# Patient Record
Sex: Female | Born: 1940 | Race: White | Hispanic: No | Marital: Single | Smoking: Never smoker
Health system: Southern US, Community
[De-identification: ages and names within clinical notes are randomized; demographics above are authoritative.]

## PROBLEM LIST (undated history)

## (undated) DIAGNOSIS — K259 Gastric ulcer, unspecified as acute or chronic, without hemorrhage or perforation: Secondary | ICD-10-CM

## (undated) DIAGNOSIS — D649 Anemia, unspecified: Secondary | ICD-10-CM

## (undated) DIAGNOSIS — E785 Hyperlipidemia, unspecified: Secondary | ICD-10-CM

## (undated) DIAGNOSIS — I779 Disorder of arteries and arterioles, unspecified: Secondary | ICD-10-CM

## (undated) DIAGNOSIS — I050 Rheumatic mitral stenosis: Secondary | ICD-10-CM

## (undated) DIAGNOSIS — I350 Nonrheumatic aortic (valve) stenosis: Secondary | ICD-10-CM

## (undated) DIAGNOSIS — E049 Nontoxic goiter, unspecified: Secondary | ICD-10-CM

## (undated) DIAGNOSIS — I10 Essential (primary) hypertension: Secondary | ICD-10-CM

## (undated) DIAGNOSIS — I214 Non-ST elevation (NSTEMI) myocardial infarction: Secondary | ICD-10-CM

## (undated) HISTORY — DX: Disorder of arteries and arterioles, unspecified: I77.9

## (undated) HISTORY — PX: TONSILLECTOMY: SUR1361

## (undated) HISTORY — DX: Anemia, unspecified: D64.9

## (undated) HISTORY — DX: Nonrheumatic aortic (valve) stenosis: I35.0

## (undated) HISTORY — DX: Essential (primary) hypertension: I10

## (undated) HISTORY — DX: Nontoxic goiter, unspecified: E04.9

## (undated) HISTORY — DX: Non-ST elevation (NSTEMI) myocardial infarction: I21.4

## (undated) HISTORY — PX: CYSTECTOMY: SUR359

## (undated) HISTORY — PX: CHOLECYSTECTOMY: SHX55

## (undated) HISTORY — DX: Hyperlipidemia, unspecified: E78.5

---

## 2020-04-22 ENCOUNTER — Encounter: Payer: Self-pay | Admitting: *Deleted

## 2020-04-22 DIAGNOSIS — I272 Pulmonary hypertension, unspecified: Secondary | ICD-10-CM | POA: Insufficient documentation

## 2020-04-22 DIAGNOSIS — I350 Nonrheumatic aortic (valve) stenosis: Secondary | ICD-10-CM | POA: Insufficient documentation

## 2020-04-22 NOTE — Progress Notes (Signed)
Cardiology Office Note   Date:  04/23/2020   ID:  Denise Page, DOB Oct 16, 1940, MRN [id number]  PCP:  Rosalee Kaufman, PA-C  Cardiologist:   Minus Breeding, MD Referring:  Rosalee Kaufman, PA-C  No chief complaint on file.     History of Present Illness: Denise Page is a 79 y.o. female who is referred by Rosalee Kaufman, PA-C for evaluation of aortic stenosis.  She at [facility] recently.  She went there really with abdominal and back pain.  I did review these records for this visit.  She was found to be severely anemic.  I do not see the etiology of this is identified.  She has been treated with iron.  She did have guaiac negative stool.  A colonoscopy was suggested but I do not think follow-up work-up has been arranged yet.  In the hospital she was noted to have elevated at bedtime troponins that stayed relatively flat around 2-300.  This was felt to be demand ischemia. Echo demonstrated left atrial dilatation.  EF was 55%.  There was moderate to severe aortic stenosis with mean gradient of 27 mm Hg.  There was mild pulmonary HTN and mild TR. she had never been told about a heart murmur.  She does not recall this but there is some confusion.  She has never had any prior cardiac work-up or diagnosis.  She is very limited.  It turns out she has some vertebral fractures and might need some injections.  She is here in part for preop clearance.  Because of the back pain and significant fatigue she does not do very much except maybe walk around a little bit at a store and frequently.  She does not describe chest pressure, neck or arm discomfort.  She does not have palpitations, presyncope or syncope.  She has been getting short of breath with mild activity but she is not describing PND or orthopnea.  She said some chronic lower extremity swelling.  Past Medical History:  Diagnosis Date  . Anemia   . Aortic stenosis   . Bilateral carotid artery disease ([place])   .  Enlarged thyroid   . Hyperlipidemia   . Hypertension   . NSTEMI (non-ST elevated myocardial infarction) [facility])     Past Surgical History:  Procedure Laterality Date  . CHOLECYSTECTOMY    . CYSTECTOMY    . TONSILLECTOMY       Current Outpatient Medications  Medication Sig Dispense Refill  . aspirin EC 81 MG tablet Take 81 mg by mouth daily. Swallow whole. (Patient not taking: Reported on 04/23/2020)    . carvedilol (COREG) 6.25 MG tablet Take 1 tablet (6.25 mg total) by mouth 2 (two) times daily with a meal. 180 tablet 1  . ferrous sulfate 325 (65 FE) MG tablet Take 325 mg by mouth daily with breakfast. (Patient not taking: Reported on 04/23/2020)    . folic acid (FOLVITE) 1 MG tablet Take 1 mg by mouth daily. (Patient not taking: Reported on 04/23/2020)    . lisinopril (ZESTRIL) 5 MG tablet Take 1 tablet (5 mg total) by mouth daily. 90 tablet 1  . rosuvastatin (CRESTOR) 5 MG tablet Take 1 tablet (5 mg total) by mouth daily. 90 tablet 1   No current facility-administered medications for this visit.    Allergies:   Morphine and related, Sulfa antibiotics, and Codeine    Social History:  The patient  reports that she has never smoked. She has never used smokeless  tobacco.   Family History:  The patient's family history is not on file.    ROS:  Please see the history of present illness.   Otherwise, review of systems are positive for none.   All other systems are reviewed and negative.    PHYSICAL EXAM: VS:  BP 140/80   Pulse 74   Ht 5\' 8"  (1.727 m)   Wt 171 lb 12.8 oz (77.9 kg)   SpO2 97%   BMI 26.12 kg/m  , BMI Body mass index is 26.12 kg/m. GENERAL:  Well appearing HEENT:  Pupils equal round and reactive, fundi not visualized, oral mucosa unremarkable NECK:  No jugular venous distention, waveform within normal limits, carotid upstroke brisk and symmetric, no bruits, no thyromegaly LYMPHATICS:  No cervical, inguinal adenopathy LUNGS:  Clear to auscultation  bilaterally BACK:  No CVA tenderness CHEST:  Unremarkable HEART:  PMI not displaced or sustained,S1 and S2 within normal limits, no S3, no S4, no clicks, no rubs, 3 of 6 apical systolic murmur radiating at the aortic outflow tract, no diastolic murmurs ABD:  Flat, positive bowel sounds normal in frequency in pitch, no bruits, no rebound, no guarding, no midline pulsatile mass, no hepatomegaly, no splenomegaly EXT:  2 plus pulses throughout, no edema, no cyanosis no clubbing SKIN:  No rashes no nodules NEURO:  Cranial nerves II through XII grossly intact, motor grossly intact throughout PSYCH:  Cognitively intact, oriented to person place and time    EKG:  EKG is not ordered today. The ekg ordered March 19, 2020 demonstrates sinus rhythm, rate 83, axis within normal limits, intervals within normal limits, no acute ST-T wave changes.   Recent Labs: No results found for requested labs within last 8760 hours.    Lipid Panel No results found for: CHOL, TRIG, HDL, CHOLHDL, VLDL, LDLCALC, LDLDIRECT    Wt Readings from Last 3 Encounters:  04/23/20 171 lb 12.8 oz (77.9 kg)      Other studies Reviewed: Additional studies/ records that were reviewed today include: Hospital records from [facility] extensively reviewed and primary care office records. Review of the above records demonstrates:  Please see elsewhere in the note.     ASSESSMENT AND PLAN:  AORTIC STENOSIS: My plan is to follow her with another echocardiogram in 6 months.  At this point I do not think she is particularly symptomatic from this.  She certainly needs to have the anemia work-up first.  We had a long discussion about this anatomy.  PULMONARY HTN: I will follow this with the echo as above.  MR: This was reported as moderate by her transthoracic echo with left atrial dilatation.  This will be followed up in 6 months and she might end up needing a TEE as well.  ELEVATED TROPONIN: I suspect this was demand  ischemia.  However, she will need ischemia work-up.  She will not be able to walk on a treadmill so she will need a Lexiscan Myoview.    ANEMIA: I have given her strict instructions about making sure she has follow-up of this.  ENLARGED THYROID: I will defer to her primary provider.  She was apparently found to be mildly hypothyroid in the hospital but was not started on thyroid medication.  This has been deferred to her primary provider.  CAROTID STENOSIS: This was mentioned in her notes from the hospital and she will need to have follow-up in 1 year.This was found to be 50 to 69% right stenosis.  Current medicines are reviewed  at length with the patient today.  The patient does not have concerns regarding medicines.    The following changes have been made:  no change  Labs/ tests ordered today include:   Orders Placed This Encounter  Procedures  . NM Myocar Multi W/Spect W/Wall Motion / EF  . ECHOCARDIOGRAM COMPLETE     Disposition:   FU with after the echocardiogram in 6 months   Signed, Minus Breeding, MD  04/23/2020 5:37 PM    [place]

## 2020-04-23 ENCOUNTER — Encounter: Payer: Self-pay | Admitting: Cardiology

## 2020-04-23 ENCOUNTER — Ambulatory Visit (INDEPENDENT_AMBULATORY_CARE_PROVIDER_SITE_OTHER): Payer: Medicare Other | Admitting: Cardiology

## 2020-04-23 ENCOUNTER — Encounter: Payer: Self-pay | Admitting: *Deleted

## 2020-04-23 VITALS — BP 140/80 | HR 74 | Ht 68.0 in | Wt 171.8 lb

## 2020-04-23 DIAGNOSIS — R079 Chest pain, unspecified: Secondary | ICD-10-CM | POA: Diagnosis not present

## 2020-04-23 DIAGNOSIS — I272 Pulmonary hypertension, unspecified: Secondary | ICD-10-CM | POA: Diagnosis not present

## 2020-04-23 DIAGNOSIS — I350 Nonrheumatic aortic (valve) stenosis: Secondary | ICD-10-CM | POA: Diagnosis not present

## 2020-04-23 DIAGNOSIS — R778 Other specified abnormalities of plasma proteins: Secondary | ICD-10-CM

## 2020-04-23 MED ORDER — LISINOPRIL 5 MG PO TABS
5.0000 mg | ORAL_TABLET | Freq: Every day | ORAL | 1 refills | Status: DC
Start: 2020-04-23 — End: 2020-10-17

## 2020-04-23 MED ORDER — ROSUVASTATIN CALCIUM 5 MG PO TABS
5.0000 mg | ORAL_TABLET | Freq: Every day | ORAL | 1 refills | Status: DC
Start: 2020-04-23 — End: 2020-10-17

## 2020-04-23 MED ORDER — CARVEDILOL 6.25 MG PO TABS
6.2500 mg | ORAL_TABLET | Freq: Two times a day (BID) | ORAL | 1 refills | Status: DC
Start: 2020-04-23 — End: 2020-10-23

## 2020-04-23 NOTE — Patient Instructions (Addendum)
Medication Instructions:    Your physician recommends that you continue on your current medications as directed. Please refer to the Current Medication list given to you today.  Labwork:  None  Testing/Procedures: Your physician has requested that you have a lexiscan myoview. For further information please visit [email or web address]. Please follow instruction sheet, as given. Your physician has requested that you have an echocardiogram in 6 months. Echocardiography is a painless test that uses sound waves to create images of your heart. It provides your doctor with information about the size and shape of your heart and how well your heart's chambers and valves are working. This procedure takes approximately one hour. There are no restrictions for this procedure.  Follow-Up:  Your physician recommends that you schedule a follow-up appointment in: 6 months in [place].  Any Other Special Instructions Will Be Listed Below (If Applicable).  If you need a refill on your cardiac medications before your next appointment, please call your pharmacy.

## 2020-04-29 ENCOUNTER — Encounter (INDEPENDENT_AMBULATORY_CARE_PROVIDER_SITE_OTHER): Payer: Self-pay | Admitting: *Deleted

## 2020-05-07 ENCOUNTER — Encounter (HOSPITAL_BASED_OUTPATIENT_CLINIC_OR_DEPARTMENT_OTHER)
Admission: RE | Admit: 2020-05-07 | Discharge: 2020-05-07 | Disposition: A | Discharge status: Home / Self Care | Payer: Medicare Other | Source: Ambulatory Visit | Attending: Cardiology | Admitting: Cardiology

## 2020-05-07 ENCOUNTER — Encounter (HOSPITAL_COMMUNITY)
Admission: RE | Admit: 2020-05-07 | Discharge: 2020-05-07 | Disposition: A | Discharge status: Home / Self Care | Payer: Medicare Other | Source: Ambulatory Visit | Attending: Cardiology | Admitting: Cardiology

## 2020-05-07 ENCOUNTER — Other Ambulatory Visit: Payer: Self-pay

## 2020-05-07 DIAGNOSIS — R778 Other specified abnormalities of plasma proteins: Secondary | ICD-10-CM | POA: Insufficient documentation

## 2020-05-07 DIAGNOSIS — R079 Chest pain, unspecified: Secondary | ICD-10-CM | POA: Diagnosis present

## 2020-05-07 LAB — NM MYOCAR MULTI W/SPECT W/WALL MOTION / EF
LV dias vol: 120 mL (ref 46–106)
LV sys vol: 52 mL
Peak HR: 95 {beats}/min
RATE: 0.41
Rest HR: 72 {beats}/min
SDS: 6
SRS: 4
SSS: 10
TID: 1.24

## 2020-05-07 MED ORDER — SODIUM CHLORIDE FLUSH 0.9 % IV SOLN
INTRAVENOUS | Status: AC
  Administered 2020-05-07: 11:00:00 10 mL via INTRAVENOUS
  Filled 2020-05-07: qty 10

## 2020-05-07 MED ORDER — TECHNETIUM TC 99M TETROFOSMIN IV KIT
30.0000 | PACK | Freq: Once | INTRAVENOUS | Status: AC | PRN
  Administered 2020-05-07: 11:00:00 31 via INTRAVENOUS

## 2020-05-07 MED ORDER — TECHNETIUM TC 99M TETROFOSMIN IV KIT
10.0000 | PACK | Freq: Once | INTRAVENOUS | Status: AC | PRN
  Administered 2020-05-07: 10:00:00 9.7 via INTRAVENOUS

## 2020-05-07 MED ORDER — REGADENOSON 0.4 MG/5ML IV SOLN
INTRAVENOUS | Status: AC
  Administered 2020-05-07: 11:00:00 0.4 mg via INTRAVENOUS
  Filled 2020-05-07: qty 5

## 2020-05-09 ENCOUNTER — Telehealth: Payer: Self-pay | Admitting: Cardiology

## 2020-05-09 NOTE — Telephone Encounter (Signed)
Pt daughter [name] [facility]) aware - routed to Leonel Ramsay, MD  Nayali Talerico T, CMA Negative stress test . No further testing. Call Ms. Harlow Mares with the results and send results to Rosalee Kaufman, PA-C

## 2020-05-09 NOTE — Telephone Encounter (Signed)
Stress test done 12/15

## 2020-05-09 NOTE — Telephone Encounter (Signed)
Patient called requesting test results of recent stress .

## 2020-05-09 NOTE — Telephone Encounter (Signed)
Please see results note. Thanks.

## 2020-06-18 ENCOUNTER — Telehealth: Payer: Self-pay

## 2020-06-18 NOTE — Telephone Encounter (Signed)
    [facility] Pre-operative Risk Assessment    HEARTCARE STAFF: - Please ensure there is not already an duplicate clearance open for this procedure. - Under Visit Info/Reason for Call, type in Other and utilize the format Clearance MM/DD/YY or Clearance TBD. Do not use dashes or single digits. - If request is for dental extraction, please clarify the # of teeth to be extracted.  Request for surgical clearance:  1. What type of surgery is being performed? Colonoscopy/Endoscopy   2. When is this surgery scheduled? TBD   3. What type of clearance is required (medical clearance vs. Pharmacy clearance to hold med vs. Both)? medical  4. Are there any medications that need to be held prior to surgery and how long?None   5. Practice name and name of physician performing surgery? [facility] , Vicie Mutters PA   6. What is the office phone number? [phone]   7.   What is the office fax number? [phone]  8.   Anesthesia type (None, local, MAC, general) ? Propofol   Monia Pouch 06/18/2020, 5:36 PM  _________________________________________________________________   (provider comments below)

## 2020-06-19 NOTE — Telephone Encounter (Signed)
   Primary Cardiologist: Minus Breeding, MD  Chart reviewed as part of pre-operative protocol coverage. Given past medical history and time since last visit, based on ACC/AHA guidelines, Kanika Bungert would be at acceptable risk for the planned procedure without further cardiovascular testing.   I will route this recommendation to the requesting party via Epic fax function and remove from pre-op pool.  Please call with questions.  Jossie Ng. Copelan Maultsby NP-C    06/19/2020, 8:25 AM [address] Office [phone] Fax [phone]

## 2020-06-20 ENCOUNTER — Other Ambulatory Visit: Payer: Self-pay | Admitting: Gastroenterology

## 2020-07-29 ENCOUNTER — Other Ambulatory Visit (HOSPITAL_COMMUNITY)
Admission: RE | Admit: 2020-07-29 | Discharge: 2020-07-29 | Disposition: A | Discharge status: Home / Self Care | Payer: Medicare Other | Source: Ambulatory Visit | Attending: Gastroenterology | Admitting: Gastroenterology

## 2020-07-29 DIAGNOSIS — Z01812 Encounter for preprocedural laboratory examination: Secondary | ICD-10-CM | POA: Diagnosis present

## 2020-07-29 DIAGNOSIS — Z20822 Contact with and (suspected) exposure to covid-19: Secondary | ICD-10-CM | POA: Insufficient documentation

## 2020-07-29 LAB — SARS CORONAVIRUS 2 (TAT 6-24 HRS): SARS Coronavirus 2: NEGATIVE

## 2020-07-31 NOTE — H&P (Signed)
History of Present Illness  General:          80 year old female with history of bilateral carotid artery disease, hyperlipidemia, history of myocardial infarction, aortic stenosis, previous cholecystectomy presents with anemia, and positive blood in stool, follow up from 05/20/20 visit.         Patient was seen last visit for anemia with positive Hemoccult after a hospitalization at [facility] 03/26/2020. Patient was having severe pain radiated around with weakness, severely anemic, did have veterbal compression fractures.         After long discussion with the patient and her daughter they decided to do more conservative measures due to her Co morbidities and dementia. due to patient's epigastric pain, possible melena versus iron use, patient was treated for possible upper GI bleed.        she was started on Protonix twice daily, Carafate twice a day and here for close follow-up.        Last visit her white blood cell count was 2.6, hemoglobin 11.6 hematocrit 35.2, platelets 147. ( Labs 03/26/2020 White blood cell count 3.6, hemoglobin 9.2, hematocrit 31.6, MCV 77, platelets 127. )        She states the medications have helped considerably, she no longer has epigastric pain, her appetite is better, her weight is up, she is on calcitonin nasal spray for her vetebral fractures and doing well with it. She continues to have dark stools but she is on an iron supplement daily She continues to have some fatigue and dizziness but daughter states her weakness has improved.         Has never had colonoscopy. Has always had constipation, will have alternating diarrha/constipation.         No bright red blood in the stool, no rectal pain, no weight loss, no nocturnal symptoms.        Patient was recently at [facility] on 03/24/2020.        Labs from 03/26/20: White blood cell count 3.6, hemoglobin 9.2, hematocrit 31.6, MCV 77, platelets 127. TSH 7.5GFR is 55, protein 7.1, normal alkaline phosphatase,  normal AST and ALT.         03/24/2020 CT chest,abdomen and pelvis with contrast showsed cardiomegaly, aortic atherosclerosis large hiatal hernia, compression fractures, normal small bowel, colon. Splenomegaly.     Vital Signs  Wt 179.8, Wt change 8.4 lb, Ht 68, BMI 27.34, Temp 97.3, Pulse sitting 74, BP sitting 161/79.   Examination  Gastroenterology Exam:       GENERAL APPEARANCE: Well developed, well nourished, no active distress, pleasant.Marland Kitchen NECK Full ROM, trachea midline, no thyromegaly or masses. RESPIRATORY clear to auscultation bilaterally , no crackles or wheezes. CARDIOVASCULAR RRR 4/6 holosystolic murmur. ABDOMEN bowel sounds present, non distended, epigastric and RUQ tenderness, no guarding or rigidity, no masses felt. RECTAL: external hemorrhoid(s) , decreased rectal tone , brown stool , stool guaiac negative. SKIN Warm and dry, good turgor without rashes. PSYCHIATRIC Alert and oriented x3, mood and affect appear normal. Repeats questions. Slow to stand with some imbalance..          Assessments  1. Iron deficiency anemia due to chronic blood loss - D50.0 (Primary)  2. Epigastric pain - R10.13    Treatment         WBC 2.6 L 4.0-11.0 - K/ul        RBC 3.85 L 4.20-5.40 - M/uL        HGB 11.3 L 12.0-16.0 - g/dL  HCT 33.3 L 37.0-47.0 - %        MCV 86.5  81.0-99.0 - fL        MCH 29.3  27.0-33.0 - pg        MCHC 33.8  32.0-36.0 - g/dL        RDW 15.7 H 11.5-15.5 - %        PLT 165  150-400 - K/uL         GLUCOSE 103 H 70-99 - mg/dL        BUN 14  6-26 - mg/dL        CREATININE 1.05  0.60-1.30 - mg/dl        eGFR (NON-AFRICAN AMERICAN) 50 L >60 - calc        eGFR (AFRICAN AMERICAN) 61  >60 - calc        SODIUM 139  136-145 - mmol/L        POTASSIUM 5.0  3.5-5.5 - mmol/L        CHLORIDE 105  98-107 - mmol/L        C02 30  22-32 - mmol/L        ANION GAP 8.6  6.0-20.0 - mmol/L        CALCIUM 9.4  8.6-10.3 - mg/dL        CA-Alb corrected 9.20   8.60-10.30 - mg/dL        T PROTEIN 6.9  6.0-8.3 - g/dL        ALBUMIN 4.2  3.4-4.8 - g/dL        T.BILI 0.6  0.3-1.0 - mg/dL        ALP 69  38-126 - U/L        AST 14  0-39 - U/L        ALT 9  0-52 - U/L    Current Medications  Taking  .Calcitonin ([place]) 200 UNIT/ACT Solution 1 spray in 1 nostril, alternating nostrils daily Nasally Once a day    .Pantoprazole Sodium 40 MG Tablet Delayed Release 1 tablet Orally twice a day before food    .Sucralfate 1 GM Tablet 1 tablet on an empty stomach Orally Twice a day before food or drink    .Albuterol , Notes: $Remove'90mg'kjzYRuC$     .Xanax(ALPRAZolam) 0.5 MG Tablet 1 tablet Orally Twice a day    .Folic Acid 1 MG Tablet 1 tablet Orally Once a day    .Rosuvastatin Calcium 5 MG Tablet 1 tablet Orally Once a day    .Carvedilol 6.25 MG Tablet 1 tablet with food Orally Twice a day    .Lisinopril 5 MG Tablet 1 tablet Orally Once a day   Not-Taking  .Aspirin $RemoveB'81mg'vEQMToCn$  tablet 1 tab Oral     Medication List reviewed and reconciled with the patient         Past Medical History        2 stress frature in back.         Heart attack.         Hemmoglobin low.        Surgical History         gall bladder surgery        Family History   no hx of colon cancer, polyps or liver diease.       Social History  General:   Tobacco use  cigarettes: Never smoked, Tobacco history last updated 06/05/2020. no Alcohol. no Recreational drug use.      Allergies   Morphine: heart stopped -  Side Effects   Codeine: vomiting - Side Effects   Tramadol: vomiting - Side Effects   Sulfur: hives - Allergy       Hospitalization/Major Diagnostic Procedure   pain and weakness 03/2020   heart attack, stayed overnight 02/2020   not in the past year 05/2020       Review of Systems  GI PROCEDURE:         Pacemaker/ AICD no. Artificial heart valves no. MI/heart attack YES. Abnormal heart rhythm YES, leaky valve. Angina no. CVA no.  Hypertension YES. Hypotension no. Asthma, COPD no. Sleep apnea YES. Seizure disorders no. Artificial joints no. Severe DJD no. Diabetes no. Significant headaches no. Vertigo YES. Depression/anxiety no. Abnormal bleeding no. Kidney Disease no. Liver disease no. Chance of pregnancy no. Blood transfusion YES.   Plan: Diagnostic EGD and colonoscopy.              Visit Codes   99213 OV LEVEL 3.        Procedure Codes   [phone] BLOOD COLLECTION ROUTINE VENIPUNCTURE

## 2020-08-01 ENCOUNTER — Ambulatory Visit (HOSPITAL_COMMUNITY): Payer: Medicare Other | Admitting: Certified Registered Nurse Anesthetist

## 2020-08-01 ENCOUNTER — Encounter (HOSPITAL_COMMUNITY): Payer: Self-pay | Admitting: Gastroenterology

## 2020-08-01 ENCOUNTER — Encounter (HOSPITAL_COMMUNITY)
Admission: RE | Disposition: A | Discharge status: Home / Self Care | Payer: Self-pay | Source: Home / Self Care | Attending: Gastroenterology

## 2020-08-01 ENCOUNTER — Other Ambulatory Visit: Payer: Self-pay

## 2020-08-01 ENCOUNTER — Ambulatory Visit (HOSPITAL_COMMUNITY)
Admission: RE | Admit: 2020-08-01 | Discharge: 2020-08-01 | Disposition: A | Discharge status: Home / Self Care | Payer: Medicare Other | Attending: Gastroenterology | Admitting: Gastroenterology

## 2020-08-01 DIAGNOSIS — I252 Old myocardial infarction: Secondary | ICD-10-CM | POA: Diagnosis not present

## 2020-08-01 DIAGNOSIS — K621 Rectal polyp: Secondary | ICD-10-CM | POA: Diagnosis not present

## 2020-08-01 DIAGNOSIS — Z9049 Acquired absence of other specified parts of digestive tract: Secondary | ICD-10-CM | POA: Insufficient documentation

## 2020-08-01 DIAGNOSIS — Z79899 Other long term (current) drug therapy: Secondary | ICD-10-CM | POA: Diagnosis not present

## 2020-08-01 DIAGNOSIS — K648 Other hemorrhoids: Secondary | ICD-10-CM | POA: Insufficient documentation

## 2020-08-01 DIAGNOSIS — K921 Melena: Secondary | ICD-10-CM | POA: Diagnosis not present

## 2020-08-01 DIAGNOSIS — D175 Benign lipomatous neoplasm of intra-abdominal organs: Secondary | ICD-10-CM | POA: Diagnosis not present

## 2020-08-01 DIAGNOSIS — Z885 Allergy status to narcotic agent status: Secondary | ICD-10-CM | POA: Diagnosis not present

## 2020-08-01 DIAGNOSIS — Z7982 Long term (current) use of aspirin: Secondary | ICD-10-CM | POA: Insufficient documentation

## 2020-08-01 DIAGNOSIS — D120 Benign neoplasm of cecum: Secondary | ICD-10-CM | POA: Diagnosis not present

## 2020-08-01 DIAGNOSIS — E785 Hyperlipidemia, unspecified: Secondary | ICD-10-CM | POA: Diagnosis not present

## 2020-08-01 DIAGNOSIS — D125 Benign neoplasm of sigmoid colon: Secondary | ICD-10-CM | POA: Diagnosis not present

## 2020-08-01 DIAGNOSIS — Q399 Congenital malformation of esophagus, unspecified: Secondary | ICD-10-CM | POA: Insufficient documentation

## 2020-08-01 DIAGNOSIS — Z888 Allergy status to other drugs, medicaments and biological substances status: Secondary | ICD-10-CM | POA: Insufficient documentation

## 2020-08-01 DIAGNOSIS — I350 Nonrheumatic aortic (valve) stenosis: Secondary | ICD-10-CM | POA: Diagnosis not present

## 2020-08-01 DIAGNOSIS — D500 Iron deficiency anemia secondary to blood loss (chronic): Secondary | ICD-10-CM | POA: Insufficient documentation

## 2020-08-01 DIAGNOSIS — K5730 Diverticulosis of large intestine without perforation or abscess without bleeding: Secondary | ICD-10-CM | POA: Insufficient documentation

## 2020-08-01 DIAGNOSIS — K2950 Unspecified chronic gastritis without bleeding: Secondary | ICD-10-CM | POA: Insufficient documentation

## 2020-08-01 DIAGNOSIS — K449 Diaphragmatic hernia without obstruction or gangrene: Secondary | ICD-10-CM | POA: Diagnosis not present

## 2020-08-01 HISTORY — PX: COLONOSCOPY: SHX5424

## 2020-08-01 HISTORY — PX: POLYPECTOMY: SHX5525

## 2020-08-01 HISTORY — PX: BIOPSY: SHX5522

## 2020-08-01 HISTORY — PX: ESOPHAGOGASTRODUODENOSCOPY (EGD) WITH PROPOFOL: SHX5813

## 2020-08-01 SURGERY — ESOPHAGOGASTRODUODENOSCOPY (EGD) WITH PROPOFOL
Anesthesia: Monitor Anesthesia Care

## 2020-08-01 MED ORDER — PROPOFOL 500 MG/50ML IV EMUL
INTRAVENOUS | Status: AC
  Filled 2020-08-01: qty 50

## 2020-08-01 MED ORDER — PHENYLEPHRINE HCL-NACL 10-0.9 MG/250ML-% IV SOLN
INTRAVENOUS | Status: DC | PRN
  Administered 2020-08-01: 40 ug/min via INTRAVENOUS

## 2020-08-01 MED ORDER — SODIUM CHLORIDE 0.9 % IV SOLN: INTRAVENOUS | Status: DC

## 2020-08-01 MED ORDER — PROPOFOL 10 MG/ML IV BOLUS
INTRAVENOUS | Status: DC | PRN
  Administered 2020-08-01: 20 mg via INTRAVENOUS

## 2020-08-01 MED ORDER — PROPOFOL 500 MG/50ML IV EMUL
INTRAVENOUS | Status: DC | PRN
  Administered 2020-08-01: 125 ug/kg/min via INTRAVENOUS

## 2020-08-01 MED ORDER — LACTATED RINGERS IV SOLN
INTRAVENOUS | Status: DC
  Administered 2020-08-01: 1000 mL via INTRAVENOUS

## 2020-08-01 SURGICAL SUPPLY — 14 items

## 2020-08-01 NOTE — Op Note (Signed)
[facility] Patient Name: Denise Page Procedure Date: 08/01/2020 MRN: [id number] Attending MD: Ronnette Juniper , MD Date of Birth: 1941-02-18 CSN: [id number] Age: 80 Admit Type: Outpatient Procedure:                Colonoscopy Indications:              This is the patient's first colonoscopy,                            Unexplained iron deficiency anemia Providers:                Ronnette Juniper, MD, Particia Nearing, RN, Laverda Sorenson,                            Technician, Dellie Catholic Referring MD:             Alex Gardener Medicines:                Monitored Anesthesia Care Complications:            No immediate complications. Estimated blood loss:                            Minimal. Estimated Blood Loss:     Estimated blood loss was minimal. Procedure:                Pre-Anesthesia Assessment:                           - Prior to the procedure, a History and Physical                            was performed, and patient medications and                            allergies were reviewed. The patient's tolerance of                            previous anesthesia was also reviewed. The risks                            and benefits of the procedure and the sedation                            options and risks were discussed with the patient.                            All questions were answered, and informed consent                            was obtained. Prior Anticoagulants: The patient has                            taken no previous anticoagulant or antiplatelet                            agents. ASA Grade  Assessment: IV - A patient with                            severe systemic disease that is a constant threat                            to life. After reviewing the risks and benefits,                            the patient was deemed in satisfactory condition to                            undergo the procedure.                           After obtaining informed  consent, the colonoscope                            was passed under direct vision. Throughout the                            procedure, the patient's blood pressure, pulse, and                            oxygen saturations were monitored continuously. The                            PCF-H190DL ([id number]) Olympus pediatric colonscope                            was introduced through the anus and advanced to the                            the terminal ileum. The colonoscopy was performed                            without difficulty. The patient tolerated the                            procedure well. The quality of the bowel                            preparation was adequate to identify polyps 6 mm                            and larger in size. Scope In: 10:30:18 AM Scope Out: 10:54:48 AM Scope Withdrawal Time: 0 hours 13 minutes 3 seconds  Total Procedure Duration: 0 hours 24 minutes 30 seconds  Findings:      Skin tags were found on perianal exam.      A 9 mm polyp was found in the rectum. The polyp was sessile. The polyp       was removed with a hot snare. Resection and retrieval were complete.      A 4 mm polyp was found in  the cecum. The polyp was sessile. The polyp       was removed with a cold biopsy forceps. Resection and retrieval were       complete.      The terminal ileum appeared normal.      A few small and large-mouthed diverticula were found in the sigmoid       colon, descending colon and cecum.      Non-bleeding internal hemorrhoids were found during retroflexion.      There was a small lipoma, in the cecum. Impression:               - Perianal skin tags found on perianal exam.                           - One 9 mm polyp in the rectum, removed with a hot                            snare. Resected and retrieved.                           - One 4 mm polyp in the cecum, removed with a cold                            biopsy forceps. Resected and retrieved.                            - The examined portion of the ileum was normal.                           - Diverticulosis in the sigmoid colon, in the                            descending colon and in the cecum.                           - Non-bleeding internal hemorrhoids.                           - Small lipoma in the cecum. Moderate Sedation:      Patient did not receive moderate sedation for this procedure, but       instead received monitored anesthesia care. Recommendation:           - Patient has a contact number available for                            emergencies. The signs and symptoms of potential                            delayed complications were discussed with the                            patient. Return to normal activities tomorrow.                            Written discharge instructions were provided  to the                            patient.                           - High fiber diet.                           - Continue present medications.                           - Await pathology results.                           - Repeat colonoscopy for surveillance based on                            pathology results. Procedure Code(s):        --- Professional ---                           [phone], Colonoscopy, flexible; with removal of                            tumor(s), polyp(s), or other lesion(s) by snare                            technique                           45380, 31, Colonoscopy, flexible; with biopsy,                            single or multiple Diagnosis Code(s):        --- Professional ---                           K62.1, Rectal polyp                           K63.5, Polyp of colon                           K64.8, Other hemorrhoids                           D17.5, Benign lipomatous neoplasm of                            intra-abdominal organs                           K64.4, Residual hemorrhoidal skin tags                           D50.9, Iron deficiency anemia,  unspecified                           K57.30,  Diverticulosis of large intestine without                            perforation or abscess without bleeding CPT copyright 2019 American Medical Association. All rights reserved. The codes documented in this report are preliminary and upon coder review may  be revised to meet current compliance requirements. Ronnette Juniper, MD 08/01/2020 11:04:58 AM This report has been signed electronically. Number of Addenda: 0

## 2020-08-01 NOTE — Anesthesia Preprocedure Evaluation (Addendum)
Anesthesia Evaluation  Patient identified by MRN, date of birth, ID band Patient awake    Reviewed: Allergy & Precautions, NPO status , Patient's Chart, lab work & pertinent test results, reviewed documented beta blocker date and time   History of Anesthesia Complications Negative for: history of anesthetic complications  Airway Mallampati: II  TM Distance: >3 FB Neck ROM: Full    Dental  (+) Dental Advisory Given, Missing, Poor Dentition   Pulmonary neg pulmonary ROS,    Pulmonary exam normal        Cardiovascular hypertension, Pt. on home beta blockers and Pt. on medications + Past MI and + Peripheral Vascular Disease  + Valvular Problems/Murmurs AS and MR  Rhythm:Regular Rate:Normal + Systolic murmurs  '21 TTE (Care Everywhere) - mildly increased LVwall  thickness. EF 55%. Grade I diastolic dysfunction (impaired relaxation). The left atrium is severely dilated in size. Moderate anterior and centrally directed MR. There is moderate to severe AS, mild AI. 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, estimated pulmonary artery systolic pressure is 45 mmHg  '21 Carotid US - 50-69% right ICAS, < 50% left ICAS     Neuro/Psych negative neurological ROS  negative psych ROS   GI/Hepatic Neg liver ROS, GERD  Medicated and Controlled,  Endo/Other  negative endocrine ROS  Renal/GU negative Renal ROS     Musculoskeletal negative musculoskeletal ROS (+)   Abdominal   Peds  Hematology negative hematology ROS (+)   Anesthesia Other Findings Covid test negative   Reproductive/Obstetrics                            Anesthesia Physical Anesthesia Plan  ASA: IV  Anesthesia Plan: MAC   Post-op Pain Management:    Induction: Intravenous  PONV Risk Score and Plan: 2 and Propofol infusion and Treatment may vary due to age or medical condition  Airway Management Planned: Nasal Cannula and Natural  Airway  Additional Equipment:   Intra-op Plan:   Post-operative Plan:   Informed Consent: I have reviewed the patients History and Physical, chart, labs and discussed the procedure including the risks, benefits and alternatives for the proposed anesthesia with the patient or authorized representative who has indicated his/her understanding and acceptance.       Plan Discussed with: CRNA and Anesthesiologist  Anesthesia Plan Comments:        Anesthesia Quick Evaluation

## 2020-08-01 NOTE — Brief Op Note (Signed)
08/01/2020  11:05 AM  PATIENT:  Denise Page  80 y.o. 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  POST-OPERATIVE DIAGNOSIS:  EGD 10 cm hiatal hernia,gastritis COLON:Sigmoid and cecal  polyp removed  PROCEDURE:  Procedure(s): ESOPHAGOGASTRODUODENOSCOPY (EGD) WITH PROPOFOL (N/A) COLONOSCOPY (N/A) BIOPSY POLYPECTOMY  SURGEON:  Surgeon(s) and Role:    Ronnette Juniper, MD - Primary  PHYSICIAN ASSISTANT:   ASSISTANTS: Karie Mainland  ANESTHESIA:   MAC  EBL:  Minimal  BLOOD ADMINISTERED:none  DRAINS: none   LOCAL MEDICATIONS USED:  NONE  SPECIMEN: Biopsies  DISPOSITION OF SPECIMEN:  PATHOLOGY  COUNTS:  YES  TOURNIQUET:  * No tourniquets in log *  DICTATION: .Dragon Dictation  PLAN OF CARE: Discharge to home after PACU  PATIENT DISPOSITION:  PACU - hemodynamically stable.   Delay start of Pharmacological VTE agent (>24hrs) due to surgical blood loss or risk of bleeding: not applicable

## 2020-08-01 NOTE — Transfer of Care (Signed)
Immediate Anesthesia Transfer of Care Note  Patient: Denise Page  Procedure(s) Performed: ESOPHAGOGASTRODUODENOSCOPY (EGD) WITH PROPOFOL (N/A ) COLONOSCOPY (N/A ) BIOPSY POLYPECTOMY  Patient Location: Endoscopy Unit  Anesthesia Type:MAC  Level of Consciousness: awake and patient cooperative  Airway & Oxygen Therapy: Patient Spontanous Breathing and Patient connected to face mask  Post-op Assessment: Report given to RN and Post -op Vital signs reviewed and stable  Post vital signs: Reviewed and stable  Last Vitals:  Vitals Value Taken Time  BP 85/43 08/01/20 1104  Temp 36.4 C 08/01/20 1104  Pulse 73 08/01/20 1105  Resp 14 08/01/20 1105  SpO2 98 % 08/01/20 1105  Vitals shown include unvalidated device data.  Last Pain:  Vitals:   08/01/20 1104  TempSrc: Axillary  PainSc:          Complications: No complications documented.

## 2020-08-01 NOTE — Interval H&P Note (Signed)
History and Physical Interval Note: 79/female with iron deficiency anemia, occult blood in stool,epigastric pain, possible melena/dark stools for an EGD and colonoscopy.  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  The various methods of treatment have been discussed with the patient and family. After consideration of risks, benefits and other options for treatment, the patient has consented to  Procedure(s): ESOPHAGOGASTRODUODENOSCOPY (EGD) WITH PROPOFOL (N/A) COLONOSCOPY (N/A) as a surgical intervention.  The patient's history has been reviewed, patient examined, no change in status, stable for surgery.  I have reviewed the patient's chart and labs.  Questions were answered to the patient's satisfaction.     Ronnette Juniper

## 2020-08-01 NOTE — Op Note (Signed)
[facility] Patient Name: Denise Page Procedure Date: 08/01/2020 MRN: [id number] Attending MD: Ronnette Juniper , MD Date of Birth: 1940/11/29 CSN: [id number] Age: 80 Admit Type: Outpatient Procedure:                Upper GI endoscopy Indications:              Unexplained iron deficiency anemia Providers:                Ronnette Juniper, MD, Particia Nearing, RN, Laverda Sorenson,                            Technician, Dellie Catholic Referring MD:             Alex Gardener Medicines:                Monitored Anesthesia Care Complications:            No immediate complications. Estimated blood loss:                            Minimal. Estimated Blood Loss:     Estimated blood loss was minimal. Procedure:                Pre-Anesthesia Assessment:                           - Prior to the procedure, a History and Physical                            was performed, and patient medications and                            allergies were reviewed. The patient's tolerance of                            previous anesthesia was also reviewed. The risks                            and benefits of the procedure and the sedation                            options and risks were discussed with the patient.                            All questions were answered, and informed consent                            was obtained. Prior Anticoagulants: The patient has                            taken no previous anticoagulant or antiplatelet                            agents. ASA Grade Assessment: IV - A patient with  severe systemic disease that is a constant threat                            to life. After reviewing the risks and benefits,                            the patient was deemed in satisfactory condition to                            undergo the procedure.                           After obtaining informed consent, the endoscope was                            passed  under direct vision. Throughout the                            procedure, the patient's blood pressure, pulse, and                            oxygen saturations were monitored continuously. The                            GIF-H190 ([id number]) Olympus gastroscope was                            introduced through the mouth, and advanced to the                            second part of duodenum. The upper GI endoscopy was                            accomplished without difficulty. The patient                            tolerated the procedure well. Scope In: Scope Out: Findings:      The examined esophagus was normal.      The Z-line was regular and was found 30 cm from the incisors.      The lower third of the esophagus was moderately tortuous.      A 10 cm hiatal hernia was present.      The cardia and gastric fundus were normal on retroflexion.      Diffuse mildly erythematous mucosa without bleeding was found in the       gastric body and in the gastric antrum. Biopsies were taken with a cold       forceps for Helicobacter pylori testing.      The examined duodenum was normal. Biopsies for histology were taken with       a cold forceps for evaluation of celiac disease. Impression:               - Normal esophagus.                           -  Z-line regular, 30 cm from the incisors.                           - Tortuous esophagus.                           - 10 cm hiatal hernia.                           - Erythematous mucosa in the gastric body and                            antrum. Biopsied.                           - Normal examined duodenum. Biopsied. Moderate Sedation:      Patient did not receive moderate sedation for this procedure, but       instead received monitored anesthesia care. Recommendation:           - Patient has a contact number available for                            emergencies. The signs and symptoms of potential                            delayed complications  were discussed with the                            patient. Return to normal activities tomorrow.                            Written discharge instructions were provided to the                            patient.                           - Resume regular diet.                           - Continue present medications.                           - Await pathology results. Procedure Code(s):        --- Professional ---                           [phone], Esophagogastroduodenoscopy, flexible,                            transoral; with biopsy, single or multiple Diagnosis Code(s):        --- Professional ---                           Q39.9, Congenital malformation of esophagus,  unspecified                           K44.9, Diaphragmatic hernia without obstruction or                            gangrene                           K31.89, Other diseases of stomach and duodenum                           D50.9, Iron deficiency anemia, unspecified CPT copyright 2019 American Medical Association. All rights reserved. The codes documented in this report are preliminary and upon coder review may  be revised to meet current compliance requirements. Ronnette Juniper, MD 08/01/2020 11:00:09 AM This report has been signed electronically. Number of Addenda: 0

## 2020-08-01 NOTE — Discharge Instructions (Signed)

## 2020-08-01 NOTE — Anesthesia Postprocedure Evaluation (Signed)
Anesthesia Post Note  Patient: Denise Page  Procedure(s) Performed: ESOPHAGOGASTRODUODENOSCOPY (EGD) WITH PROPOFOL (N/A ) COLONOSCOPY (N/A ) BIOPSY POLYPECTOMY     Patient location during evaluation: PACU Anesthesia Type: MAC Level of consciousness: awake and alert Pain management: pain level controlled Vital Signs Assessment: post-procedure vital signs reviewed and stable Respiratory status: spontaneous breathing, nonlabored ventilation and respiratory function stable Cardiovascular status: stable and blood pressure returned to baseline Anesthetic complications: no   No complications documented.  Last Vitals:  Vitals:   08/01/20 1110 08/01/20 1120  BP: (!) 152/68 (!) 157/69  Pulse: 68 65  Resp: 11 20  Temp:    SpO2: 98% 99%    Last Pain:  Vitals:   08/01/20 1120  TempSrc:   PainSc: 0-No pain                 Audry Pili

## 2020-08-04 ENCOUNTER — Ambulatory Visit (INDEPENDENT_AMBULATORY_CARE_PROVIDER_SITE_OTHER): Payer: Medicare Other | Admitting: Gastroenterology

## 2020-08-04 ENCOUNTER — Encounter (HOSPITAL_COMMUNITY): Payer: Self-pay | Admitting: Gastroenterology

## 2020-08-04 LAB — SURGICAL PATHOLOGY

## 2020-09-22 ENCOUNTER — Telehealth: Payer: Self-pay | Admitting: Hematology and Oncology

## 2020-09-22 NOTE — Telephone Encounter (Signed)
Received a call from Ms. Ingram daughter to move her appt from [facility] to [facility]. Ms. Lewis has been scheduled to see Dr. Chryl Heck on 5/10 at 11:20am.

## 2020-09-30 ENCOUNTER — Inpatient Hospital Stay: Payer: Medicare Other | Attending: Hematology and Oncology | Admitting: Hematology and Oncology

## 2020-09-30 ENCOUNTER — Other Ambulatory Visit: Payer: Self-pay

## 2020-09-30 ENCOUNTER — Telehealth: Payer: Self-pay | Admitting: Hematology and Oncology

## 2020-09-30 ENCOUNTER — Encounter: Payer: Self-pay | Admitting: Hematology and Oncology

## 2020-09-30 ENCOUNTER — Inpatient Hospital Stay: Payer: Medicare Other

## 2020-09-30 VITALS — BP 158/76 | HR 66 | Temp 97.6°F | Resp 17 | Ht 68.0 in | Wt 172.5 lb

## 2020-09-30 DIAGNOSIS — D61818 Other pancytopenia: Secondary | ICD-10-CM | POA: Diagnosis present

## 2020-09-30 DIAGNOSIS — R600 Localized edema: Secondary | ICD-10-CM | POA: Insufficient documentation

## 2020-09-30 DIAGNOSIS — E538 Deficiency of other specified B group vitamins: Secondary | ICD-10-CM

## 2020-09-30 DIAGNOSIS — I214 Non-ST elevation (NSTEMI) myocardial infarction: Secondary | ICD-10-CM | POA: Insufficient documentation

## 2020-09-30 DIAGNOSIS — E785 Hyperlipidemia, unspecified: Secondary | ICD-10-CM | POA: Insufficient documentation

## 2020-09-30 DIAGNOSIS — I10 Essential (primary) hypertension: Secondary | ICD-10-CM | POA: Insufficient documentation

## 2020-09-30 DIAGNOSIS — I350 Nonrheumatic aortic (valve) stenosis: Secondary | ICD-10-CM | POA: Diagnosis not present

## 2020-09-30 DIAGNOSIS — E039 Hypothyroidism, unspecified: Secondary | ICD-10-CM

## 2020-09-30 DIAGNOSIS — I252 Old myocardial infarction: Secondary | ICD-10-CM | POA: Insufficient documentation

## 2020-09-30 DIAGNOSIS — D6189 Other specified aplastic anemias and other bone marrow failure syndromes: Secondary | ICD-10-CM

## 2020-09-30 LAB — CBC WITH DIFFERENTIAL/PLATELET
Abs Immature Granulocytes: 0.05 10*3/uL (ref 0.00–0.07)
Basophils Absolute: 0 10*3/uL (ref 0.0–0.1)
Basophils Relative: 0 %
Eosinophils Absolute: 0 10*3/uL (ref 0.0–0.5)
Eosinophils Relative: 0 %
HCT: 34.4 % — ABNORMAL LOW (ref 36.0–46.0)
Hemoglobin: 11.2 g/dL — ABNORMAL LOW (ref 12.0–15.0)
Immature Granulocytes: 2 %
Lymphocytes Relative: 39 %
Lymphs Abs: 1.3 10*3/uL (ref 0.7–4.0)
MCH: 28.8 pg (ref 26.0–34.0)
MCHC: 32.6 g/dL (ref 30.0–36.0)
MCV: 88.4 fL (ref 80.0–100.0)
Monocytes Absolute: 0.5 10*3/uL (ref 0.1–1.0)
Monocytes Relative: 16 %
Neutro Abs: 1.4 10*3/uL — ABNORMAL LOW (ref 1.7–7.7)
Neutrophils Relative %: 43 %
Platelets: 158 10*3/uL (ref 150–400)
RBC: 3.89 MIL/uL (ref 3.87–5.11)
RDW: 14.5 % (ref 11.5–15.5)
WBC: 3.4 10*3/uL — ABNORMAL LOW (ref 4.0–10.5)
nRBC: 0 % (ref 0.0–0.2)

## 2020-09-30 LAB — CMP (CANCER CENTER ONLY)
ALT: 8 U/L (ref 0–44)
AST: 15 U/L (ref 15–41)
Albumin: 4.5 g/dL (ref 3.5–5.0)
Alkaline Phosphatase: 65 U/L (ref 38–126)
Anion gap: 9 (ref 5–15)
BUN: 20 mg/dL (ref 8–23)
CO2: 27 mmol/L (ref 22–32)
Calcium: 9.3 mg/dL (ref 8.9–10.3)
Chloride: 106 mmol/L (ref 98–111)
Creatinine: 1.12 mg/dL — ABNORMAL HIGH (ref 0.44–1.00)
GFR, Estimated: 50 mL/min — ABNORMAL LOW (ref >60)
Glucose, Bld: 100 mg/dL — ABNORMAL HIGH (ref 70–99)
Potassium: 4 mmol/L (ref 3.5–5.1)
Sodium: 142 mmol/L (ref 135–145)
Total Bilirubin: 0.7 mg/dL (ref 0.3–1.2)
Total Protein: 7.5 g/dL (ref 6.5–8.1)

## 2020-09-30 LAB — RETICULOCYTES
Immature Retic Fract: 15.8 % (ref 2.3–15.9)
RBC.: 3.8 MIL/uL — ABNORMAL LOW (ref 3.87–5.11)
Retic Count, Absolute: 68.4 10*3/uL (ref 19.0–186.0)
Retic Ct Pct: 1.8 % (ref 0.4–3.1)

## 2020-09-30 LAB — LACTATE DEHYDROGENASE: LDH: 177 U/L (ref 98–192)

## 2020-09-30 LAB — VITAMIN B12: Vitamin B-12: 323 pg/mL (ref 180–914)

## 2020-09-30 LAB — TSH: TSH: 13.098 u[IU]/mL — ABNORMAL HIGH (ref 0.308–3.960)

## 2020-09-30 NOTE — Assessment & Plan Note (Signed)
This is a very pleasant 80 year old female patient with past medical history significant for non-STEMI, hypertension, dyslipidemia referred to hematology for evaluation of pancytopenia. She only complains of fatigue and progressive weight loss in the past 1 to 1-1/2-year mostly because of reduced intake.   She does not usually go to the doctor's, she last went to the ER when she almost had a non-STEMI which was thought to be from severe anemia. Physical examination, some pallor noted, otherwise loss of supraclavicular fat padding consistent with weight loss, no palpable lymphadenopathy or splenomegaly.  Some bilateral lower extremity edema noted. I reviewed her labs which showed white blood cell count of 2800, neutropenia, hemoglobin of over 10 g, platelet count of 1 33,000.  Ferritin was 22.  I did not see a B12 lab. I discussed about common causes of pancytopenia including but not limited to nutritional deficiencies, autoimmune diseases, medications, liver diseases, hepatitis, bone marrow disorders.  We have agreed to start with lab work and if there is no clear etiology for the pancytopenia, we would recommend proceeding with bone marrow aspiration and biopsy.  She is very reluctant about this procedure but she would like to think about it. Family history of metastatic pancreatic cancer in her brother, recommended considering genetic testing, she will think about it. Thank you for consulting [place] in the care of this patient.  Please do not hesitate to contact us with any additional questions or concerns.

## 2020-09-30 NOTE — Progress Notes (Signed)
Denise Page NOTE  Patient Care Team: Rosine Door as PCP - General (Physician Assistant) Minus Breeding, MD as PCP - Cardiology (Cardiology)  CHIEF COMPLAINTS/PURPOSE OF CONSULTATION:  Pancytopenia.  ASSESSMENT & PLAN:  Other pancytopenia ([place]) This is a very pleasant 80 year old female patient with past medical history significant for non-STEMI, hypertension, dyslipidemia referred to hematology for evaluation of pancytopenia. She only complains of fatigue and progressive weight loss in the past 1 to 1-1/2-year mostly because of reduced intake.   She does not usually go to the doctor's, she last went to the ER when she almost had a non-STEMI which was thought to be from severe anemia. Physical examination, some pallor noted, otherwise loss of supraclavicular fat padding consistent with weight loss, no palpable lymphadenopathy or splenomegaly.  Some bilateral lower extremity edema noted. I reviewed her labs which showed white blood cell count of 2800, neutropenia, hemoglobin of over 10 g, platelet count of 1 33,000.  Ferritin was 22.  I did not see a B12 lab. I discussed about common causes of pancytopenia including but not limited to nutritional deficiencies, autoimmune diseases, medications, liver diseases, hepatitis, bone marrow disorders.  We have agreed to start with lab work and if there is no clear etiology for the pancytopenia, we would recommend proceeding with bone marrow aspiration and biopsy.  She is very reluctant about this procedure but she would like to think about it. Family history of metastatic pancreatic cancer in her brother, recommended considering genetic testing, she will think about it. Thank you for consulting [place] in the care of this patient.  Please do not hesitate to contact us with any additional questions or concerns.  Orders Placed This Encounter  Procedures  . CBC with Differential/Platelet    Standing Status:   Standing     Number of Occurrences:   22    Standing Expiration Date:   09/30/2021  . Pathologist smear review    Standing Status:   Future    Number of Occurrences:   1    Standing Expiration Date:   09/30/2021  . Folate RBC    Standing Status:   Future    Number of Occurrences:   1    Standing Expiration Date:   09/30/2021  . Lactate dehydrogenase    Standing Status:   Future    Number of Occurrences:   1    Standing Expiration Date:   09/30/2021  . Reticulocytes    Standing Status:   Future    Number of Occurrences:   1    Standing Expiration Date:   09/30/2021  . ANA, IFA (with reflex)    Standing Status:   Future    Number of Occurrences:   1    Standing Expiration Date:   09/30/2021  . CMP ([place] only)    Standing Status:   Future    Number of Occurrences:   1    Standing Expiration Date:   09/30/2021  . Vitamin B12    Standing Status:   Future    Number of Occurrences:   1    Standing Expiration Date:   09/30/2021  . TSH    Standing Status:   Standing    Number of Occurrences:   22    Standing Expiration Date:   09/30/2021     HISTORY OF PRESENTING ILLNESS:   Denise Page 80 y.o. female is here because of pancytopenia.  This is a very pleasant 80 yr  old female patient with PMH significant for HTN, dyslipidemia, bilateral carotid disease, AS, NSTEMI referred to hematology for evaluation of pancytopenia.  Patient arrived to the appointment today with her daughter. Patient denies any health complaints except for fatigue.  She does not go to the doctor regularly.  Last time she was seen was in the hospital in October 2021 when she was feeling very tired and was thought to have non-STEMI. Prior to that she went to the hospital in December 2020 and we do not have any other records prior to that. She denies any fevers, drenching night sweats.  Daughter mentions that she has lost weight over time.  In the last year, she may have lost 20 pounds and this is most likely because patient  eats minimally.  She mostly eats 1 or 2 meals a day at the best. She denies any changes in breathing, bowel habits or urinary habits.  She does have some orthostatic dizziness when she stands up suddenly.  She does not admit to any ongoing neuropathy at this time.  Family history significant for brother who died of metastatic prostate cancer, another sister had throat cancer.  Rest of the pertinent 10 point ROS reviewed and negative.  REVIEW OF SYSTEMS:    Constitutional: Denies fevers, chills or abnormal night sweats Eyes: Occasional blurred vision when she wakes up.   Ears, nose, mouth, throat, and face: Denies mucositis or sore throat Respiratory: Denies cough, dyspnea or wheezes Cardiovascular: Denies palpitation, chest discomfort or lower extremity swelling Gastrointestinal:  Denies nausea, heartburn or change in bowel habits Skin: Denies abnormal skin rashes Lymphatics: Denies new lymphadenopathy or easy bruising Neurological:Denies numbness, tingling or new weaknesses Behavioral/Psych: Mood is stable, no new changes  All other systems were reviewed with the patient and are negative.  MEDICAL HISTORY:  Past Medical History:  Diagnosis Date  . Anemia   . Aortic stenosis   . Bilateral carotid artery disease ([place])   . Enlarged thyroid   . Hyperlipidemia   . Hypertension   . NSTEMI (non-ST elevated myocardial infarction) [facility])     SURGICAL HISTORY: Past Surgical History:  Procedure Laterality Date  . BIOPSY  08/01/2020   Procedure: BIOPSY;  Surgeon: Ronnette Juniper, MD;  Location: [facility] ENDOSCOPY;  Service: Gastroenterology;;  EGD and COLON  . CHOLECYSTECTOMY    . COLONOSCOPY N/A 08/01/2020   Procedure: COLONOSCOPY;  Surgeon: Ronnette Juniper, MD;  Location: [facility] ENDOSCOPY;  Service: Gastroenterology;  Laterality: N/A;  . CYSTECTOMY    . ESOPHAGOGASTRODUODENOSCOPY (EGD) WITH PROPOFOL N/A 08/01/2020   Procedure: ESOPHAGOGASTRODUODENOSCOPY (EGD) WITH PROPOFOL;  Surgeon: Ronnette Juniper, MD;   Location: [facility] ENDOSCOPY;  Service: Gastroenterology;  Laterality: N/A;  . POLYPECTOMY  08/01/2020   Procedure: POLYPECTOMY;  Surgeon: Ronnette Juniper, MD;  Location: [facility] ENDOSCOPY;  Service: Gastroenterology;;  . TONSILLECTOMY      SOCIAL HISTORY: Social History   Socioeconomic History  . Marital status: Single    Spouse name: Not on file  . Number of children: Not on file  . Years of education: Not on file  . Highest education level: Not on file  Occupational History  . Not on file  Tobacco Use  . Smoking status: Never Smoker  . Smokeless tobacco: Never Used  Substance and Sexual Activity  . Alcohol use: Not on file  . Drug use: Not on file  . Sexual activity: Not on file  Other Topics Concern  . Not on file  Social History Narrative  . Not on file  Social Determinants of Health   Financial Resource Strain: Not on file  Food Insecurity: Not on file  Transportation Needs: Not on file  Physical Activity: Not on file  Stress: Not on file  Social Connections: Not on file  Intimate Partner Violence: Not on file    FAMILY HISTORY: Family History  Problem Relation Age of Onset  . Throat cancer Sister   . Prostate cancer Brother     ALLERGIES:  is allergic to morphine and related, sulfa antibiotics, codeine, and tramadol.  MEDICATIONS:  Current Outpatient Medications  Medication Sig Dispense Refill  . acetaminophen (TYLENOL) 500 MG tablet Take 1,000 mg by mouth every 6 (six) hours as needed for moderate pain or headache.    . ALPRAZolam (XANAX) 0.5 MG tablet Take 0.5 mg by mouth at bedtime.    Marland Kitchen aspirin EC 81 MG tablet Take 81 mg by mouth daily. Swallow whole.    . calcitonin, salmon, (MIACALCIN/FORTICAL) 200 UNIT/ACT nasal spray Place 1 spray into alternate nostrils daily as needed (pain).    . carvedilol (COREG) 6.25 MG tablet Take 1 tablet (6.25 mg total) by mouth 2 (two) times daily with a meal. 180 tablet 1  . escitalopram (LEXAPRO) 5 MG tablet Take 5 mg by mouth  daily.    . ferrous sulfate 325 (65 FE) MG tablet Take 325 mg by mouth daily with breakfast.    . folic acid (FOLVITE) 1 MG tablet Take 1 mg by mouth daily.    Marland Kitchen lisinopril (ZESTRIL) 5 MG tablet Take 1 tablet (5 mg total) by mouth daily. 90 tablet 1  . pantoprazole (PROTONIX) 40 MG tablet Take 40 mg by mouth daily.    . rosuvastatin (CRESTOR) 5 MG tablet Take 1 tablet (5 mg total) by mouth daily. 90 tablet 1  . sucralfate (CARAFATE) 1 g tablet Take 1 g by mouth 2 (two) times daily.     No current facility-administered medications for this visit.   PHYSICAL EXAMINATION: ECOG PERFORMANCE STATUS: 0 - Asymptomatic  Vitals:   09/30/20 1114  BP: (!) 158/76  Pulse: 66  Resp: 17  Temp: 97.6 F (36.4 C)  SpO2: 98%   Filed Weights   09/30/20 1114  Weight: 172 lb 8 oz (78.2 kg)    GENERAL:alert, no distress and comfortable 1 time SKIN: skin color, texture, turgor are normal, no rashes or significant lesions EYES: normal, conjunctiva are pink and non-injected, sclera clear OROPHARYNX:no exudate, no erythema and lips, buccal mucosa, and tongue normal  NECK: supple, thyroid normal size, non-tender, without nodularity LYMPH:  no palpable lymphadenopathy in the cervical, axillary LUNGS: clear to auscultation and percussion with normal breathing effort HEART: regular rate & rhythm and no murmurs and no lower extremity edema ABDOMEN:abdomen soft, non-tender and normal bowel sounds Musculoskeletal:no cyanosis of digits and no clubbing  PSYCH: alert & oriented x 3 with fluent speech NEURO: no focal motor/sensory deficits  LABORATORY DATA:  I have reviewed the data as listed Lab Results  Component Value Date   WBC 3.4 (L) 09/30/2020   HGB 11.2 (L) 09/30/2020   HCT 34.4 (L) 09/30/2020   MCV 88.4 09/30/2020   PLT 158 09/30/2020     Chemistry      Component Value Date/Time   NA 142 09/30/2020 1214   K 4.0 09/30/2020 1214   CL 106 09/30/2020 1214   CO2 27 09/30/2020 1214   BUN 20  09/30/2020 1214   CREATININE 1.12 (H) 09/30/2020 1214      Component  Value Date/Time   CALCIUM 9.3 09/30/2020 1214   ALKPHOS 65 09/30/2020 1214   AST 15 09/30/2020 1214   ALT 8 09/30/2020 1214   BILITOT 0.7 09/30/2020 1214     I have reviewed labs scanned to [place].  RADIOGRAPHIC STUDIES: I have personally reviewed the radiological images as listed and agreed with the findings in the report. No results found.  All questions were answered. The patient knows to call the clinic with any problems, questions or concerns. I spent 45 minutes in the care of this patient including H and P, review of records, counseling and coordination of care.     Benay Pike, MD 09/30/2020 1:05 PM

## 2020-09-30 NOTE — Telephone Encounter (Signed)
Scheduled follow-up appointment per 5/10 los. Patient is aware. ?

## 2020-10-01 ENCOUNTER — Other Ambulatory Visit: Payer: Self-pay

## 2020-10-01 ENCOUNTER — Other Ambulatory Visit: Payer: Self-pay | Admitting: Hematology and Oncology

## 2020-10-01 DIAGNOSIS — E039 Hypothyroidism, unspecified: Secondary | ICD-10-CM

## 2020-10-01 LAB — ANTINUCLEAR ANTIBODIES, IFA: ANA Ab, IFA: NEGATIVE

## 2020-10-01 LAB — T4, FREE: Free T4: 0.58 ng/dL — ABNORMAL LOW (ref 0.61–1.12)

## 2020-10-01 LAB — PATHOLOGIST SMEAR REVIEW

## 2020-10-02 LAB — FOLATE RBC
Folate, Hemolysate: 547 ng/mL
Folate, RBC: 1563 ng/mL (ref >498)
Hematocrit: 35 % (ref 34.0–46.6)

## 2020-10-03 ENCOUNTER — Ambulatory Visit (HOSPITAL_COMMUNITY): Payer: Medicare Other | Admitting: Hematology and Oncology

## 2020-10-17 ENCOUNTER — Other Ambulatory Visit: Payer: Self-pay | Admitting: Cardiology

## 2020-10-21 ENCOUNTER — Inpatient Hospital Stay (HOSPITAL_BASED_OUTPATIENT_CLINIC_OR_DEPARTMENT_OTHER): Payer: Medicare Other | Admitting: Hematology and Oncology

## 2020-10-21 ENCOUNTER — Other Ambulatory Visit: Payer: Self-pay

## 2020-10-21 ENCOUNTER — Inpatient Hospital Stay: Payer: Medicare Other

## 2020-10-21 VITALS — BP 140/60 | HR 62 | Temp 97.9°F | Resp 17 | Ht 68.0 in | Wt 173.8 lb

## 2020-10-21 DIAGNOSIS — D61818 Other pancytopenia: Secondary | ICD-10-CM | POA: Diagnosis not present

## 2020-10-21 DIAGNOSIS — E039 Hypothyroidism, unspecified: Secondary | ICD-10-CM | POA: Diagnosis not present

## 2020-10-21 LAB — CMP (CANCER CENTER ONLY)
ALT: 11 U/L (ref 0–44)
AST: 13 U/L — ABNORMAL LOW (ref 15–41)
Albumin: 4.2 g/dL (ref 3.5–5.0)
Alkaline Phosphatase: 49 U/L (ref 38–126)
Anion gap: 12 (ref 5–15)
BUN: 23 mg/dL (ref 8–23)
CO2: 25 mmol/L (ref 22–32)
Calcium: 9.8 mg/dL (ref 8.9–10.3)
Chloride: 105 mmol/L (ref 98–111)
Creatinine: 1.26 mg/dL — ABNORMAL HIGH (ref 0.44–1.00)
GFR, Estimated: 43 mL/min — ABNORMAL LOW (ref >60)
Glucose, Bld: 97 mg/dL (ref 70–99)
Potassium: 4.8 mmol/L (ref 3.5–5.1)
Sodium: 142 mmol/L (ref 135–145)
Total Bilirubin: 0.9 mg/dL (ref 0.3–1.2)
Total Protein: 7.2 g/dL (ref 6.5–8.1)

## 2020-10-21 LAB — CBC WITH DIFFERENTIAL/PLATELET
Abs Immature Granulocytes: 0.04 10*3/uL (ref 0.00–0.07)
Basophils Absolute: 0 10*3/uL (ref 0.0–0.1)
Basophils Relative: 0 %
Eosinophils Absolute: 0 10*3/uL (ref 0.0–0.5)
Eosinophils Relative: 0 %
HCT: 35.1 % — ABNORMAL LOW (ref 36.0–46.0)
Hemoglobin: 11.4 g/dL — ABNORMAL LOW (ref 12.0–15.0)
Immature Granulocytes: 1 %
Lymphocytes Relative: 43 %
Lymphs Abs: 1.8 10*3/uL (ref 0.7–4.0)
MCH: 29.2 pg (ref 26.0–34.0)
MCHC: 32.5 g/dL (ref 30.0–36.0)
MCV: 90 fL (ref 80.0–100.0)
Monocytes Absolute: 0.8 10*3/uL (ref 0.1–1.0)
Monocytes Relative: 19 %
Neutro Abs: 1.6 10*3/uL — ABNORMAL LOW (ref 1.7–7.7)
Neutrophils Relative %: 37 %
Platelets: 121 10*3/uL — ABNORMAL LOW (ref 150–400)
RBC: 3.9 MIL/uL (ref 3.87–5.11)
RDW: 15.3 % (ref 11.5–15.5)
WBC: 4.2 10*3/uL (ref 4.0–10.5)
nRBC: 0 % (ref 0.0–0.2)

## 2020-10-21 LAB — T4, FREE: Free T4: 0.69 ng/dL (ref 0.61–1.12)

## 2020-10-21 NOTE — Progress Notes (Signed)
[place] NOTE  Patient Care Team: Rosine Door as PCP - General (Physician Assistant) Minus Breeding, MD as PCP - Cardiology (Cardiology)  CHIEF COMPLAINTS/PURPOSE OF CONSULTATION:  Pancytopenia.  ASSESSMENT & PLAN:  Other pancytopenia ([place]) Patient is a very pleasant 80 year old female patient with past medical history significant for hypertension, non-STEMI, aortic stenosis referred to hematology for evaluation of pancytopenia.  During her initial visit we added some blood work which suggested presence of hypothyroidism.  She has been recently started on levothyroxine and B12 supplementation, she was previously on iron supplementation which she is continuing.  She is here for follow-up.  Since last visit, she has not lost weight, continues to feel well. Physical examination, no major findings. Review of labs today showed improvement of leukopenia, stable anemia, mild thrombocytopenia.  TSH and T4 have shown correction since initiation of levothyroxine. I recommended that she continue with current supplementation and return to clinic in 3 months or sooner if she has any active concerns.  She is wanting to proceed with conservative management and surveillance.  RTC in 3 months. Thank you for consulting [place] in the care of this patient.  Please do not hesitate to contact us with any additional questions or concerns.  Orders Placed This Encounter  Procedures  . CBC with Differential/Platelet    Standing Status:   Standing    Number of Occurrences:   22    Standing Expiration Date:   10/21/2021  . CMP ([place] only)    Standing Status:   Future    Number of Occurrences:   1    Standing Expiration Date:   10/21/2021     HISTORY OF PRESENTING ILLNESS:   Denise Page 80 y.o. female is here because of pancytopenia.  This is a very pleasant 80 yr old female patient with PMH significant for HTN, dyslipidemia, bilateral carotid disease, AS,  NSTEMI referred to hematology for evaluation of pancytopenia.     Interval history Patient is here for a follow-up with her daughter.  Since her last visit, she continues on iron supplementation, recently started levothyroxine and B12 supplementation about 2 weeks ago. She tells me she feels better, energy levels are better overall.  She has been able to maintain weight, actually gained a pound and has been eating well. No interim infections or hospitalizations.  Rest of the pertinent 10 point ROS reviewed and negative.  MEDICAL HISTORY:  Past Medical History:  Diagnosis Date  . Anemia   . Aortic stenosis   . Bilateral carotid artery disease ([place])   . Enlarged thyroid   . Hyperlipidemia   . Hypertension   . NSTEMI (non-ST elevated myocardial infarction) [facility])     SURGICAL HISTORY: Past Surgical History:  Procedure Laterality Date  . BIOPSY  08/01/2020   Procedure: BIOPSY;  Surgeon: Ronnette Juniper, MD;  Location: [facility];  Service: Gastroenterology;;  EGD and COLON  . CHOLECYSTECTOMY    . COLONOSCOPY N/A 08/01/2020   Procedure: COLONOSCOPY;  Surgeon: Ronnette Juniper, MD;  Location: [facility];  Service: Gastroenterology;  Laterality: N/A;  . CYSTECTOMY    . ESOPHAGOGASTRODUODENOSCOPY (EGD) WITH PROPOFOL N/A 08/01/2020   Procedure: ESOPHAGOGASTRODUODENOSCOPY (EGD) WITH PROPOFOL;  Surgeon: Ronnette Juniper, MD;  Location: [facility];  Service: Gastroenterology;  Laterality: N/A;  . POLYPECTOMY  08/01/2020   Procedure: POLYPECTOMY;  Surgeon: Ronnette Juniper, MD;  Location: [facility];  Service: Gastroenterology;;  . TONSILLECTOMY      SOCIAL HISTORY: Social History   Socioeconomic  History  . Marital status: Single    Spouse name: Not on file  . Number of children: Not on file  . Years of education: Not on file  . Highest education level: Not on file  Occupational History  . Not on file  Tobacco Use  . Smoking status: Never Smoker  . Smokeless tobacco: Never Used  Substance and  Sexual Activity  . Alcohol use: Not on file  . Drug use: Not on file  . Sexual activity: Not on file  Other Topics Concern  . Not on file  Social History Narrative  . Not on file   Social Determinants of Health   Financial Resource Strain: Not on file  Food Insecurity: Not on file  Transportation Needs: Not on file  Physical Activity: Not on file  Stress: Not on file  Social Connections: Not on file  Intimate Partner Violence: Not on file    FAMILY HISTORY: Family History  Problem Relation Age of Onset  . Throat cancer Sister   . Prostate cancer Brother     ALLERGIES:  is allergic to morphine and related, sulfa antibiotics, codeine, and tramadol.  MEDICATIONS:  Current Outpatient Medications  Medication Sig Dispense Refill  . acetaminophen (TYLENOL) 500 MG tablet Take 1,000 mg by mouth every 6 (six) hours as needed for moderate pain or headache.    . ALPRAZolam (XANAX) 0.5 MG tablet Take 0.5 mg by mouth at bedtime.    Marland Kitchen aspirin EC 81 MG tablet Take 81 mg by mouth daily. Swallow whole.    . calcitonin, salmon, (MIACALCIN/FORTICAL) 200 UNIT/ACT nasal spray Place 1 spray into alternate nostrils daily as needed (pain).    . carvedilol (COREG) 6.25 MG tablet Take 1 tablet (6.25 mg total) by mouth 2 (two) times daily with a meal. 180 tablet 1  . escitalopram (LEXAPRO) 5 MG tablet Take 5 mg by mouth daily.    . ferrous sulfate 325 (65 FE) MG tablet Take 325 mg by mouth daily with breakfast.    . folic acid (FOLVITE) 1 MG tablet Take 1 mg by mouth daily.    Marland Kitchen levothyroxine (SYNTHROID) 25 MCG tablet Take 25 mcg by mouth every morning.    Marland Kitchen lisinopril (ZESTRIL) 5 MG tablet TAKE 1 TABLET BY MOUTH EVERY DAY 90 tablet 1  . pantoprazole (PROTONIX) 40 MG tablet Take 40 mg by mouth daily.    . rosuvastatin (CRESTOR) 5 MG tablet TAKE 1 TABLET BY MOUTH EVERY DAY 90 tablet 1  . sucralfate (CARAFATE) 1 g tablet Take 1 g by mouth 2 (two) times daily.     No current facility-administered  medications for this visit.   PHYSICAL EXAMINATION: ECOG PERFORMANCE STATUS: 0 - Asymptomatic  Vitals:   10/21/20 1409  BP: 140/60  Pulse: 62  Resp: 17  Temp: 97.9 F (36.6 C)  SpO2: 98%   Filed Weights   10/21/20 1409  Weight: 173 lb 12.8 oz (78.8 kg)    Physical Exam Constitutional:      Appearance: Normal appearance. She is normal weight.  HENT:     Head: Normocephalic and atraumatic.  Cardiovascular:     Rate and Rhythm: Normal rate and regular rhythm.     Pulses: Normal pulses.     Heart sounds: Normal heart sounds.  Pulmonary:     Effort: Pulmonary effort is normal.     Breath sounds: Normal breath sounds.  Abdominal:     General: Abdomen is flat. Bowel sounds are normal.  Palpations: Abdomen is soft.  Musculoskeletal:        General: No swelling or tenderness.  Skin:    General: Skin is warm and dry.  Neurological:     General: No focal deficit present.     Mental Status: She is alert.  Psychiatric:        Mood and Affect: Mood normal.        Behavior: Behavior normal.      LABORATORY DATA:  I have reviewed the data as listed Lab Results  Component Value Date   WBC 4.2 10/21/2020   HGB 11.4 (L) 10/21/2020   HCT 35.1 (L) 10/21/2020   MCV 90.0 10/21/2020   PLT 121 (L) 10/21/2020     Chemistry      Component Value Date/Time   NA 142 10/21/2020 1517   K 4.8 10/21/2020 1517   CL 105 10/21/2020 1517   CO2 25 10/21/2020 1517   BUN 23 10/21/2020 1517   CREATININE 1.26 (H) 10/21/2020 1517      Component Value Date/Time   CALCIUM 9.8 10/21/2020 1517   ALKPHOS 49 10/21/2020 1517   AST 13 (L) 10/21/2020 1517   ALT 11 10/21/2020 1517   BILITOT 0.9 10/21/2020 1517     I have reviewed labs. CBC repeated today showed resolution of leukopenia, improvement in anemia, mild thrombocytopenia.  RADIOGRAPHIC STUDIES: I have personally reviewed the radiological images as listed and agreed with the findings in the report. No results found.  All  questions were answered. The patient knows to call the clinic with any problems, questions or concerns. I spent 45 minutes in the care of this patient including H and P, review of records, counseling and coordination of care. TSH and T4 improved.    Benay Pike, MD 10/22/2020 12:05 PM

## 2020-10-22 ENCOUNTER — Ambulatory Visit (INDEPENDENT_AMBULATORY_CARE_PROVIDER_SITE_OTHER): Payer: Medicare Other

## 2020-10-22 ENCOUNTER — Encounter: Payer: Self-pay | Admitting: Hematology and Oncology

## 2020-10-22 DIAGNOSIS — I350 Nonrheumatic aortic (valve) stenosis: Secondary | ICD-10-CM | POA: Diagnosis not present

## 2020-10-22 LAB — ECHOCARDIOGRAM COMPLETE
AR max vel: 0.62 cm2
AV Area VTI: 0.62 cm2
AV Area mean vel: 0.6 cm2
AV Mean grad: 34.4 mmHg
AV Peak grad: 56.9 mmHg
AV Vena cont: 0.32 cm
Ao pk vel: 3.77 m/s
Area-P 1/2: 5.02 cm2
Calc EF: 66.7 %
MV M vel: 6.44 m/s
MV Peak grad: 165.9 mmHg
P 1/2 time: 667 msec
Radius: 0.57 cm
S' Lateral: 3.75 cm
Single Plane A2C EF: 66.6 %
Single Plane A4C EF: 64.3 %

## 2020-10-22 LAB — TSH: TSH: 2.851 u[IU]/mL (ref 0.308–3.960)

## 2020-10-22 NOTE — Assessment & Plan Note (Signed)
Patient is a very pleasant 80 year old female patient with past medical history significant for hypertension, non-STEMI, aortic stenosis referred to hematology for evaluation of pancytopenia.  During her initial visit we added some blood work which suggested presence of hypothyroidism.  She has been recently started on levothyroxine and B12 supplementation, she was previously on iron supplementation which she is continuing.  She is here for follow-up.  Since last visit, she has not lost weight, continues to feel well. Physical examination, no major findings. Review of labs today showed improvement of leukopenia, stable anemia, mild thrombocytopenia.  TSH and T4 have shown correction since initiation of levothyroxine. I recommended that she continue with current supplementation and return to clinic in 3 months or sooner if she has any active concerns.  She is wanting to proceed with conservative management and surveillance.  RTC in 3 months. Thank you for consulting [place] in the care of this patient.  Please do not hesitate to contact us with any additional questions or concerns.

## 2020-10-23 ENCOUNTER — Other Ambulatory Visit: Payer: Self-pay | Admitting: Cardiology

## 2020-10-30 NOTE — H&P (View-Only) (Signed)
Cardiology Office Note   Date:  10/31/2020   ID:  Denise Page, DOB [birth year], MRN [id number]  PCP:  Rosalee Kaufman, PA-C  Cardiologist:   Minus Breeding, MD Referring:  Rosalee Kaufman, PA-C  Chief Complaint  Patient presents with   Abnormal Echocardiogram       History of Present Illness: Denise Page is a 80 y.o. female who is referred by Rosalee Kaufman, PA-C for evaluation of aortic stenosis.  Echo in Dec last year at [facility] demonstrated left atrial dilatation.  EF was 55%.  There was moderate to severe aortic stenosis with mean gradient of 27 mm Hg.  She had chest pain and had a negative perfusion study last year after I saw her.  She had a repeat echo this month and she had results as below with severe MR and AS.  I brought her back today do discuss this.  .  She is not describing any new shortness of breath, PND or orthopnea.  She is not having any palpitations, presyncope or syncope.  She had no weight gain or edema.    Past Medical History:  Diagnosis Date   Anemia    Aortic stenosis    Bilateral carotid artery disease ([place])    Enlarged thyroid    Hyperlipidemia    Hypertension    NSTEMI (non-ST elevated myocardial infarction) [facility])     Past Surgical History:  Procedure Laterality Date   BIOPSY  08/01/2020   Procedure: BIOPSY;  Surgeon: Ronnette Juniper, MD;  [facility];  Service: Gastroenterology;;  EGD and COLON   CHOLECYSTECTOMY     COLONOSCOPY N/A 08/01/2020   Procedure: COLONOSCOPY;  Surgeon: Ronnette Juniper, MD;  Location: [facility];  Service: Gastroenterology;  Laterality: N/A;   CYSTECTOMY     ESOPHAGOGASTRODUODENOSCOPY (EGD) WITH PROPOFOL N/A 08/01/2020   Procedure: ESOPHAGOGASTRODUODENOSCOPY (EGD) WITH PROPOFOL;  Surgeon: Ronnette Juniper, MD;  [facility];  Service: Gastroenterology;  Laterality: N/A;   POLYPECTOMY  08/01/2020   Procedure: POLYPECTOMY;  Surgeon: Ronnette Juniper, MD;  [facility];   Service: Gastroenterology;;   TONSILLECTOMY       Current Outpatient Medications  Medication Sig Dispense Refill   acetaminophen (TYLENOL) 500 MG tablet Take 1,000 mg by mouth every 6 (six) hours as needed for moderate pain or headache.     ALPRAZolam (XANAX) 0.5 MG tablet Take 0.5 mg by mouth at bedtime.     aspirin EC 81 MG tablet Take 81 mg by mouth daily. Swallow whole.     calcitonin, salmon, (MIACALCIN/FORTICAL) 200 UNIT/ACT nasal spray Place 1 spray into alternate nostrils daily as needed (pain).     carvedilol (COREG) 6.25 MG tablet TAKE 1 TABLET BY MOUTH TWICE DAILY with a meal 180 tablet 1   escitalopram (LEXAPRO) 5 MG tablet Take 5 mg by mouth daily.     ferrous sulfate 325 (65 FE) MG tablet Take 325 mg by mouth daily with breakfast.     folic acid (FOLVITE) 1 MG tablet Take 1 mg by mouth daily.     levothyroxine (SYNTHROID) 25 MCG tablet Take 25 mcg by mouth every morning.     lisinopril (ZESTRIL) 5 MG tablet TAKE 1 TABLET BY MOUTH EVERY DAY 90 tablet 1   pantoprazole (PROTONIX) 40 MG tablet Take 40 mg by mouth daily.     rosuvastatin (CRESTOR) 5 MG tablet TAKE 1 TABLET BY MOUTH EVERY DAY 90 tablet 1   sucralfate (CARAFATE) 1 g tablet Take 1  g by mouth 2 (two) times daily.     No current facility-administered medications for this visit.    Allergies:   Morphine and related, Sulfa antibiotics, Codeine, and Tramadol   ROS:  Please see the history of present illness.   Otherwise, review of systems are positive for none.   All other systems are reviewed and negative.    PHYSICAL EXAM: VS:  BP (!) 155/76   Pulse 66   Ht 5\' 8"  (1.727 m)   Wt 173 lb (78.5 kg)   BMI 26.30 kg/m  , BMI Body mass index is 26.3 kg/m. GENERAL:  Well appearing NECK:  No jugular venous distention, waveform within normal limits, carotid upstroke brisk and symmetric, no bruits, no thyromegaly LUNGS:  Clear to auscultation bilaterally CHEST:  Unremarkable HEART:  PMI not displaced or sustained,S1  and S2 within normal limits, no S3, no S4, no clicks, no rubs, 3 out of 6 apical systolic murmur radiating slightly at the aortic outflow tract, also what sounds like a new 3 out of 6 holosystolic murmur in the axilla radiating from the apex, no diastolic murmurs ABD:  Flat, positive bowel sounds normal in frequency in pitch, no bruits, no rebound, no guarding, no midline pulsatile mass, no hepatomegaly, no splenomegaly EXT:  2 plus pulses throughout, no edema, no cyanosis no clubbing  ECHO:    1. Left ventricular ejection fraction, by estimation, is 60 to 65%. The  left ventricle has normal function. The left ventricle has no regional  wall motion abnormalities. There is mild asymmetric left ventricular  hypertrophy of the septal segment. Left  ventricular diastolic parameters are consistent with Grade II diastolic  dysfunction (pseudonormalization). Normal global longitudinal strain of  -17.1%.   2. Right ventricular systolic function is normal. The right ventricular  size is normal. There is mildly elevated pulmonary artery systolic  pressure. The estimated right ventricular systolic pressure is 26.9 mmHg.   3. Left atrial size was severely dilated.   4. The mitral valve is abnormal. There of moderate prolapse of the  posterior leaflet with flail portion as well. Severe mitral valve  regurgitation, anteriorly directed and underestimated by PISA.   5. The aortic valve is tricuspid. There is moderate calcification of the  aortic valve. Aortic valve regurgitation is mild. Severe aortic valve  stenosis. Aortic regurgitation PHT measures 667 msec. Aortic valve mean  gradient measures 34.4 mmHg.  Dimentionless index 0.27.   EKG:  EKG is not ordered today.    Recent Labs: 10/21/2020: ALT 11; BUN 23; Creatinine 1.26; Hemoglobin 11.4; Platelets 121; Potassium 4.8; Sodium 142; TSH 2.851    Lipid Panel No results found for: CHOL, TRIG, HDL, CHOLHDL, VLDL, LDLCALC, LDLDIRECT    Wt  Readings from Last 3 Encounters:  10/31/20 173 lb (78.5 kg)  10/21/20 173 lb 12.8 oz (78.8 kg)  09/30/20 172 lb 8 oz (78.2 kg)      Other studies Reviewed: Additional studies/ records that were reviewed today include: Echo Review of the above records demonstrates:  Please see elsewhere in the note.     ASSESSMENT AND PLAN:  AORTIC STENOSIS:   This appears to be severe and she needs cardiac cath and likely surgical replacement with mitral surgery.  She will need a cardiac cath.   However, I will stage this until after the TEE.  She likely would not want surgery until after her 80th birthday party in early July.    PULMONARY HTN:   This will be assessed  as below.  MR:    She will need likely repair of this and she will need TEE.  I am going to start with this study and then plan on scheduling and timing of the cath and deciding on what exact type of surgery she needs.  I had a long discussion with the patient and her daughter about this.   ELEVATED TROPONIN:    This will be evaluated as above.     ANEMIA:    she had colonoscopy and EGD.  She had polyps resected.  She said she has a hiatal hernia and apparently some slow GI bleeding per her gastroenterologist but there is no other therapies that need to be done.  She just needs to be surveyed and might need occasional transfusions.   ENLARGED THYROID: Her recent TSH was normal.  No change in therapy.  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  She needs follow up later this year.     Current medicines are reviewed at length with the patient today.  The patient does not have concerns regarding medicines.    The following changes have been made:  None  Labs/ tests ordered today include:   Orders Placed This Encounter  Procedures   CBC   Basic Metabolic Panel (BMET)      Disposition:   FU with me after the TEE.      Signed, Minus Breeding, MD  10/31/2020 1:18 PM    [place]

## 2020-10-30 NOTE — Progress Notes (Signed)
Cardiology Office Note   Date:  10/31/2020   ID:  Denise Page, DOB [birth year], MRN [id number]  PCP:  Rosalee Kaufman, PA-C  Cardiologist:   Minus Breeding, MD Referring:  Rosalee Kaufman, PA-C  Chief Complaint  Patient presents with   Abnormal Echocardiogram       History of Present Illness: Denise Page is a 80 y.o. female who is referred by Rosalee Kaufman, PA-C for evaluation of aortic stenosis.  Echo in Dec last year at [facility] demonstrated left atrial dilatation.  EF was 55%.  There was moderate to severe aortic stenosis with mean gradient of 27 mm Hg.  She had chest pain and had a negative perfusion study last year after I saw her.  She had a repeat echo this month and she had results as below with severe MR and AS.  I brought her back today do discuss this.  .  She is not describing any new shortness of breath, PND or orthopnea.  She is not having any palpitations, presyncope or syncope.  She had no weight gain or edema.    Past Medical History:  Diagnosis Date   Anemia    Aortic stenosis    Bilateral carotid artery disease ([place])    Enlarged thyroid    Hyperlipidemia    Hypertension    NSTEMI (non-ST elevated myocardial infarction) [facility])     Past Surgical History:  Procedure Laterality Date   BIOPSY  08/01/2020   Procedure: BIOPSY;  Surgeon: Ronnette Juniper, MD;  Location: [facility] ENDOSCOPY;  Service: Gastroenterology;;  EGD and COLON   CHOLECYSTECTOMY     COLONOSCOPY N/A 08/01/2020   Procedure: COLONOSCOPY;  Surgeon: Ronnette Juniper, MD;  Location: [facility] ENDOSCOPY;  Service: Gastroenterology;  Laterality: N/A;   CYSTECTOMY     ESOPHAGOGASTRODUODENOSCOPY (EGD) WITH PROPOFOL N/A 08/01/2020   Procedure: ESOPHAGOGASTRODUODENOSCOPY (EGD) WITH PROPOFOL;  Surgeon: Ronnette Juniper, MD;  Location: [facility] ENDOSCOPY;  Service: Gastroenterology;  Laterality: N/A;   POLYPECTOMY  08/01/2020   Procedure: POLYPECTOMY;  Surgeon: Ronnette Juniper, MD;  Location: [facility] ENDOSCOPY;   Service: Gastroenterology;;   TONSILLECTOMY       Current Outpatient Medications  Medication Sig Dispense Refill   acetaminophen (TYLENOL) 500 MG tablet Take 1,000 mg by mouth every 6 (six) hours as needed for moderate pain or headache.     ALPRAZolam (XANAX) 0.5 MG tablet Take 0.5 mg by mouth at bedtime.     aspirin EC 81 MG tablet Take 81 mg by mouth daily. Swallow whole.     calcitonin, salmon, (MIACALCIN/FORTICAL) 200 UNIT/ACT nasal spray Place 1 spray into alternate nostrils daily as needed (pain).     carvedilol (COREG) 6.25 MG tablet TAKE 1 TABLET BY MOUTH TWICE DAILY with a meal 180 tablet 1   escitalopram (LEXAPRO) 5 MG tablet Take 5 mg by mouth daily.     ferrous sulfate 325 (65 FE) MG tablet Take 325 mg by mouth daily with breakfast.     folic acid (FOLVITE) 1 MG tablet Take 1 mg by mouth daily.     levothyroxine (SYNTHROID) 25 MCG tablet Take 25 mcg by mouth every morning.     lisinopril (ZESTRIL) 5 MG tablet TAKE 1 TABLET BY MOUTH EVERY DAY 90 tablet 1   pantoprazole (PROTONIX) 40 MG tablet Take 40 mg by mouth daily.     rosuvastatin (CRESTOR) 5 MG tablet TAKE 1 TABLET BY MOUTH EVERY DAY 90 tablet 1   sucralfate (CARAFATE) 1 g tablet Take 1  g by mouth 2 (two) times daily.     No current facility-administered medications for this visit.    Allergies:   Morphine and related, Sulfa antibiotics, Codeine, and Tramadol   ROS:  Please see the history of present illness.   Otherwise, review of systems are positive for none.   All other systems are reviewed and negative.    PHYSICAL EXAM: VS:  BP (!) 155/76   Pulse 66   Ht 5\' 8"  (1.727 m)   Wt 173 lb (78.5 kg)   BMI 26.30 kg/m  , BMI Body mass index is 26.3 kg/m. GENERAL:  Well appearing NECK:  No jugular venous distention, waveform within normal limits, carotid upstroke brisk and symmetric, no bruits, no thyromegaly LUNGS:  Clear to auscultation bilaterally CHEST:  Unremarkable HEART:  PMI not displaced or sustained,S1  and S2 within normal limits, no S3, no S4, no clicks, no rubs, 3 out of 6 apical systolic murmur radiating slightly at the aortic outflow tract, also what sounds like a new 3 out of 6 holosystolic murmur in the axilla radiating from the apex, no diastolic murmurs ABD:  Flat, positive bowel sounds normal in frequency in pitch, no bruits, no rebound, no guarding, no midline pulsatile mass, no hepatomegaly, no splenomegaly EXT:  2 plus pulses throughout, no edema, no cyanosis no clubbing  ECHO:    1. Left ventricular ejection fraction, by estimation, is 60 to 65%. The  left ventricle has normal function. The left ventricle has no regional  wall motion abnormalities. There is mild asymmetric left ventricular  hypertrophy of the septal segment. Left  ventricular diastolic parameters are consistent with Grade II diastolic  dysfunction (pseudonormalization). Normal global longitudinal strain of  -17.1%.   2. Right ventricular systolic function is normal. The right ventricular  size is normal. There is mildly elevated pulmonary artery systolic  pressure. The estimated right ventricular systolic pressure is 62.2 mmHg.   3. Left atrial size was severely dilated.   4. The mitral valve is abnormal. There of moderate prolapse of the  posterior leaflet with flail portion as well. Severe mitral valve  regurgitation, anteriorly directed and underestimated by PISA.   5. The aortic valve is tricuspid. There is moderate calcification of the  aortic valve. Aortic valve regurgitation is mild. Severe aortic valve  stenosis. Aortic regurgitation PHT measures 667 msec. Aortic valve mean  gradient measures 34.4 mmHg.  Dimentionless index 0.27.   EKG:  EKG is not ordered today.    Recent Labs: 10/21/2020: ALT 11; BUN 23; Creatinine 1.26; Hemoglobin 11.4; Platelets 121; Potassium 4.8; Sodium 142; TSH 2.851    Lipid Panel No results found for: CHOL, TRIG, HDL, CHOLHDL, VLDL, LDLCALC, LDLDIRECT    Wt  Readings from Last 3 Encounters:  10/31/20 173 lb (78.5 kg)  10/21/20 173 lb 12.8 oz (78.8 kg)  09/30/20 172 lb 8 oz (78.2 kg)      Other studies Reviewed: Additional studies/ records that were reviewed today include: Echo Review of the above records demonstrates:  Please see elsewhere in the note.     ASSESSMENT AND PLAN:  AORTIC STENOSIS:   This appears to be severe and she needs cardiac cath and likely surgical replacement with mitral surgery.  She will need a cardiac cath.   However, I will stage this until after the TEE.  She likely would not want surgery until after her 80th birthday party in early July.    PULMONARY HTN:   This will be assessed  as below.  MR:    She will need likely repair of this and she will need TEE.  I am going to start with this study and then plan on scheduling and timing of the cath and deciding on what exact type of surgery she needs.  I had a long discussion with the patient and her daughter about this.   ELEVATED TROPONIN:    This will be evaluated as above.     ANEMIA:    she had colonoscopy and EGD.  She had polyps resected.  She said she has a hiatal hernia and apparently some slow GI bleeding per her gastroenterologist but there is no other therapies that need to be done.  She just needs to be surveyed and might need occasional transfusions.   ENLARGED THYROID: Her recent TSH was normal.  No change in therapy.  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  She needs follow up later this year.     Current medicines are reviewed at length with the patient today.  The patient does not have concerns regarding medicines.    The following changes have been made:  None  Labs/ tests ordered today include:   Orders Placed This Encounter  Procedures   CBC   Basic Metabolic Panel (BMET)      Disposition:   FU with me after the TEE.      Signed, Minus Breeding, MD  10/31/2020 1:18 PM    [place]

## 2020-10-31 ENCOUNTER — Other Ambulatory Visit: Payer: Self-pay

## 2020-10-31 ENCOUNTER — Ambulatory Visit (INDEPENDENT_AMBULATORY_CARE_PROVIDER_SITE_OTHER): Payer: Medicare Other | Admitting: Cardiology

## 2020-10-31 ENCOUNTER — Telehealth: Payer: Self-pay | Admitting: Cardiology

## 2020-10-31 ENCOUNTER — Encounter: Payer: Self-pay | Admitting: Cardiology

## 2020-10-31 VITALS — BP 155/76 | HR 66 | Ht 68.0 in | Wt 173.0 lb

## 2020-10-31 DIAGNOSIS — I340 Nonrheumatic mitral (valve) insufficiency: Secondary | ICD-10-CM | POA: Diagnosis not present

## 2020-10-31 NOTE — Telephone Encounter (Signed)
Pre-cert Verification for the following procedure     TEE 11/13/2020 WITH DR. Johnsie Cancel AT [facility]

## 2020-10-31 NOTE — Patient Instructions (Signed)
Medication Instructions:  Continue current medication  *If you need a refill on your cardiac medications before your next appointment, please call your pharmacy*   Lab Work: CBC and BMP  If you have labs (blood work) drawn today and your tests are completely normal, you will receive your results only by: [place] (if you have MyChart) OR A paper copy in the mail If you have any lab test that is abnormal or we need to change your treatment, we will call you to review the results.   Testing/Procedures: Your physician has requested that you have a TEE. During a TEE, sound waves are used to create images of your heart. It provides your doctor with information about the size and shape of your heart and how well your heart's chambers and valves are working. In this test, a transducer is attached to the end of a flexible tube that's guided down your throat and into your esophagus (the tube leading from you mouth to your stomach) to get a more detailed image of your heart. You are not awake for the procedure. Please see the instruction sheet given to you today. For further information please visit [email or web address].   Follow-Up: At [facility], you and your health needs are our priority.  As part of our continuing mission to provide you with exceptional heart care, we have created designated Provider Care Teams.  These Care Teams include your primary Cardiologist (physician) and Advanced Practice Providers (APPs -  Physician Assistants and Nurse Practitioners) who all work together to provide you with the care you need, when you need it.  We recommend signing up for the patient portal called "MyChart".  Sign up information is provided on this After Visit Summary.  MyChart is used to connect with patients for Virtual Visits (Telemedicine).  Patients are able to view lab/test results, encounter notes, upcoming appointments, etc.  Non-urgent messages can be sent to your provider as well.   To  learn more about what you can do with MyChart, go to [email or web address].    Your next appointment:   2 month(s)  The format for your next appointment:   In Person  Provider:   Minus Breeding, MD   Other Instructions Dear Denise Page   You are scheduled for a TEE on June 23rd with Dr. Johnsie Cancel.  Please arrive at the [facility] (Main Entrance A) at [facility]: [address] [place], [place] [address] at 10:30 am. (1 hour prior to procedure unless lab work is needed; if lab work is needed arrive 1.5 hours ahead)  DIET: Nothing to eat or drink after midnight except a sip of water with medications (see medication instructions below)  FYI: For your safety, and to allow [place] to monitor your vital signs accurately during the surgery/procedure we request that   if you have artificial nails, gel coating, SNS etc. Please have those removed prior to your surgery/procedure. Not having the nail coverings /polish removed may result in cancellation or delay of your surgery/procedure.   Medication Instructions:    Come to: [facility] Come to the lab  between the hours of 8:00 am and 4:30 pm. You do not have to be fasting.   You must have a responsible person to drive you home and stay in the waiting area during your procedure. Failure to do so could result in cancellation.  Bring your insurance cards.  *Special Note: Every effort is made to have your procedure done on time. Occasionally there are emergencies  that occur at the hospital that may cause delays. Please be patient if a delay does occur.

## 2020-11-05 ENCOUNTER — Other Ambulatory Visit: Payer: Self-pay

## 2020-11-05 ENCOUNTER — Other Ambulatory Visit (HOSPITAL_COMMUNITY)
Admission: RE | Admit: 2020-11-05 | Discharge: 2020-11-05 | Disposition: A | Discharge status: Home / Self Care | Payer: Medicare Other | Source: Ambulatory Visit | Attending: Cardiology | Admitting: Cardiology

## 2020-11-05 DIAGNOSIS — I340 Nonrheumatic mitral (valve) insufficiency: Secondary | ICD-10-CM | POA: Insufficient documentation

## 2020-11-05 LAB — BASIC METABOLIC PANEL
Anion gap: 6 (ref 5–15)
BUN: 21 mg/dL (ref 8–23)
CO2: 29 mmol/L (ref 22–32)
Calcium: 8.9 mg/dL (ref 8.9–10.3)
Chloride: 104 mmol/L (ref 98–111)
Creatinine, Ser: 1.08 mg/dL — ABNORMAL HIGH (ref 0.44–1.00)
GFR, Estimated: 52 mL/min — ABNORMAL LOW (ref >60)
Glucose, Bld: 98 mg/dL (ref 70–99)
Potassium: 3.9 mmol/L (ref 3.5–5.1)
Sodium: 139 mmol/L (ref 135–145)

## 2020-11-05 LAB — CBC
HCT: 36.7 % (ref 36.0–46.0)
Hemoglobin: 11.8 g/dL — ABNORMAL LOW (ref 12.0–15.0)
MCH: 30.1 pg (ref 26.0–34.0)
MCHC: 32.2 g/dL (ref 30.0–36.0)
MCV: 93.6 fL (ref 80.0–100.0)
Platelets: 143 10*3/uL — ABNORMAL LOW (ref 150–400)
RBC: 3.92 MIL/uL (ref 3.87–5.11)
RDW: 15.1 % (ref 11.5–15.5)
WBC: 3.1 10*3/uL — ABNORMAL LOW (ref 4.0–10.5)
nRBC: 0 % (ref 0.0–0.2)

## 2020-11-10 ENCOUNTER — Encounter: Payer: Self-pay | Admitting: *Deleted

## 2020-11-13 ENCOUNTER — Ambulatory Visit (HOSPITAL_COMMUNITY)
Admission: RE | Admit: 2020-11-13 | Discharge: 2020-11-13 | Disposition: A | Discharge status: Home / Self Care | Payer: Medicare Other | Attending: Cardiovascular Disease | Admitting: Cardiovascular Disease

## 2020-11-13 ENCOUNTER — Encounter (HOSPITAL_COMMUNITY)
Admission: RE | Disposition: A | Discharge status: Home / Self Care | Payer: Self-pay | Source: Home / Self Care | Attending: Cardiovascular Disease

## 2020-11-13 ENCOUNTER — Ambulatory Visit (HOSPITAL_BASED_OUTPATIENT_CLINIC_OR_DEPARTMENT_OTHER)
Admission: RE | Admit: 2020-11-13 | Discharge: 2020-11-13 | Disposition: A | Discharge status: Home / Self Care | Payer: Medicare Other | Source: Home / Self Care | Attending: Cardiovascular Disease | Admitting: Cardiovascular Disease

## 2020-11-13 ENCOUNTER — Encounter (HOSPITAL_COMMUNITY): Payer: Self-pay | Admitting: Cardiovascular Disease

## 2020-11-13 ENCOUNTER — Ambulatory Visit (HOSPITAL_COMMUNITY): Payer: Medicare Other | Admitting: Certified Registered"

## 2020-11-13 DIAGNOSIS — I272 Pulmonary hypertension, unspecified: Secondary | ICD-10-CM | POA: Insufficient documentation

## 2020-11-13 DIAGNOSIS — I350 Nonrheumatic aortic (valve) stenosis: Secondary | ICD-10-CM

## 2020-11-13 DIAGNOSIS — I340 Nonrheumatic mitral (valve) insufficiency: Secondary | ICD-10-CM

## 2020-11-13 DIAGNOSIS — I083 Combined rheumatic disorders of mitral, aortic and tricuspid valves: Secondary | ICD-10-CM | POA: Diagnosis not present

## 2020-11-13 DIAGNOSIS — D649 Anemia, unspecified: Secondary | ICD-10-CM | POA: Diagnosis not present

## 2020-11-13 DIAGNOSIS — I6521 Occlusion and stenosis of right carotid artery: Secondary | ICD-10-CM | POA: Insufficient documentation

## 2020-11-13 DIAGNOSIS — I361 Nonrheumatic tricuspid (valve) insufficiency: Secondary | ICD-10-CM

## 2020-11-13 HISTORY — PX: TEE WITHOUT CARDIOVERSION: SHX5443

## 2020-11-13 LAB — ECHO TEE
AR max vel: 0.89 cm2
AV Area VTI: 0.68 cm2
AV Area mean vel: 0.81 cm2
AV Mean grad: 20 mmHg
AV Peak grad: 35.5 mmHg
Ao pk vel: 2.98 m/s
MV M vel: 6.08 m/s
MV Peak grad: 147.9 mmHg
Radius: 0.55 cm

## 2020-11-13 SURGERY — ECHOCARDIOGRAM, TRANSESOPHAGEAL
Anesthesia: Monitor Anesthesia Care

## 2020-11-13 MED ORDER — DEXMEDETOMIDINE (PRECEDEX) IN NS 20 MCG/5ML (4 MCG/ML) IV SYRINGE
PREFILLED_SYRINGE | INTRAVENOUS | Status: DC | PRN
  Administered 2020-11-13 (×3): 4 ug via INTRAVENOUS

## 2020-11-13 MED ORDER — PROPOFOL 500 MG/50ML IV EMUL
INTRAVENOUS | Status: DC | PRN
  Administered 2020-11-13: 100 ug/kg/min via INTRAVENOUS

## 2020-11-13 MED ORDER — PHENYLEPHRINE HCL-NACL 10-0.9 MG/250ML-% IV SOLN
INTRAVENOUS | Status: DC | PRN
  Administered 2020-11-13: 15 ug/min via INTRAVENOUS

## 2020-11-13 MED ORDER — BUTAMBEN-TETRACAINE-BENZOCAINE 2-2-14 % EX AERO
INHALATION_SPRAY | CUTANEOUS | Status: DC | PRN
  Administered 2020-11-13: 1 via TOPICAL

## 2020-11-13 MED ORDER — FENTANYL CITRATE (PF) 100 MCG/2ML IJ SOLN
INTRAMUSCULAR | Status: DC | PRN
  Administered 2020-11-13: 25 ug via INTRAVENOUS

## 2020-11-13 MED ORDER — PHENYLEPHRINE 40 MCG/ML (10ML) SYRINGE FOR IV PUSH (FOR BLOOD PRESSURE SUPPORT)
PREFILLED_SYRINGE | INTRAVENOUS | Status: DC | PRN
  Administered 2020-11-13: 40 ug via INTRAVENOUS

## 2020-11-13 MED ORDER — SODIUM CHLORIDE 0.9 % IV SOLN: INTRAVENOUS | Status: DC | PRN

## 2020-11-13 NOTE — Anesthesia Postprocedure Evaluation (Signed)
Anesthesia Post Note  Patient: Denise Page  Procedure(s) Performed: TRANSESOPHAGEAL ECHOCARDIOGRAM (TEE)     Patient location during evaluation: Endoscopy Anesthesia Type: MAC Level of consciousness: awake and alert Pain management: pain level controlled Vital Signs Assessment: post-procedure vital signs reviewed and stable Respiratory status: spontaneous breathing, nonlabored ventilation and respiratory function stable Cardiovascular status: blood pressure returned to baseline and stable Postop Assessment: no apparent nausea or vomiting Anesthetic complications: no   No notable events documented.  Last Vitals:  Vitals:   11/13/20 1218 11/13/20 1225  BP: (!) 136/55 (!) 140/49  Pulse: 81 84  Resp: (!) 9 20  Temp:    SpO2: 97% 100%    Last Pain:  Vitals:   11/13/20 1225  TempSrc:   PainSc: 0-No pain                 Lidia Collum

## 2020-11-13 NOTE — Anesthesia Preprocedure Evaluation (Signed)
Anesthesia Evaluation  Patient identified by MRN, date of birth, ID band Patient awake    Reviewed: Allergy & Precautions, NPO status , Patient's Chart, lab work & pertinent test results, reviewed documented beta blocker date and time   History of Anesthesia Complications Negative for: history of anesthetic complications  Airway Mallampati: II  TM Distance: >3 FB Neck ROM: Full    Dental   Pulmonary neg pulmonary ROS,    Pulmonary exam normal        Cardiovascular hypertension, Pt. on medications and Pt. on home beta blockers + Past MI  Normal cardiovascular exam+ Valvular Problems/Murmurs MR and AS      Neuro/Psych Bilateral carotid artery disease negative neurological ROS     GI/Hepatic Neg liver ROS, GERD  ,  Endo/Other  Hypothyroidism   Renal/GU negative Renal ROS  negative genitourinary   Musculoskeletal negative musculoskeletal ROS (+)   Abdominal   Peds  Hematology negative hematology ROS (+)   Anesthesia Other Findings  Echo 2022 normal EF, severe MR, severe AS  Reproductive/Obstetrics                             Anesthesia Physical Anesthesia Plan  ASA: 4  Anesthesia Plan: MAC   Post-op Pain Management:    Induction: Intravenous  PONV Risk Score and Plan: 2 and Propofol infusion, TIVA and Treatment may vary due to age or medical condition  Airway Management Planned: Natural Airway, Nasal Cannula and Simple Face Mask  Additional Equipment: None  Intra-op Plan:   Post-operative Plan:   Informed Consent: I have reviewed the patients History and Physical, chart, labs and discussed the procedure including the risks, benefits and alternatives for the proposed anesthesia with the patient or authorized representative who has indicated his/her understanding and acceptance.       Plan Discussed with:   Anesthesia Plan Comments:         Anesthesia Quick  Evaluation

## 2020-11-13 NOTE — Transfer of Care (Signed)
Immediate Anesthesia Transfer of Care Note  Patient: Denise Page  Procedure(s) Performed: TRANSESOPHAGEAL ECHOCARDIOGRAM (TEE)  Patient Location: Endoscopy Unit  Anesthesia Type:MAC  Level of Consciousness: awake, alert  and oriented  Airway & Oxygen Therapy: Patient Spontanous Breathing and Patient connected to nasal cannula oxygen  Post-op Assessment: Report given to RN and Post -op Vital signs reviewed and stable  Post vital signs: Reviewed and stable  Last Vitals:  Vitals Value Taken Time  BP    Temp    Pulse    Resp    SpO2      Last Pain:  Vitals:   11/13/20 1100  TempSrc: Temporal  PainSc: 0-No pain         Complications: No notable events documented.

## 2020-11-13 NOTE — Discharge Instructions (Signed)

## 2020-11-13 NOTE — Interval H&P Note (Signed)
History and Physical Interval Note:  11/13/2020 10:23 AM  Denise Page  has presented today for surgery, with the diagnosis of MITRAL REGURGITATION.  The various methods of treatment have been discussed with the patient and family. After consideration of risks, benefits and other options for treatment, the patient has consented to  Procedure(s): TRANSESOPHAGEAL ECHOCARDIOGRAM (TEE) (N/A) as a surgical intervention.  The patient's history has been reviewed, patient examined, no change in status, stable for surgery.  I have reviewed the patient's chart and labs.  Questions were answered to the patient's satisfaction.     Jenkins Rouge

## 2020-11-13 NOTE — CV Procedure (Signed)
TEE:  Normal EF 55% Moderate AS Severe MR with prolapse P2 segment Normal RV No ASD/PFO No LAA thrombus No effusion  See full report in [place]  Jenkins Rouge MD [facility]

## 2020-11-13 NOTE — Progress Notes (Signed)
  Echocardiogram Echocardiogram Transesophageal has been performed.  Darlina Sicilian M 11/13/2020, 12:20 PM

## 2020-11-13 NOTE — Anesthesia Procedure Notes (Signed)
Procedure Name: MAC Date/Time: 11/13/2020 11:30 AM Performed by: Griffin Dakin, CRNA Pre-anesthesia Checklist: Patient identified, Emergency Drugs available, Suction available, Patient being monitored and Timeout performed Patient Re-evaluated:Patient Re-evaluated prior to induction Oxygen Delivery Method: Nasal cannula Induction Type: IV induction Airway Equipment and Method: Patient positioned with wedge pillow Placement Confirmation: positive ETCO2 and breath sounds checked- equal and bilateral Dental Injury: Teeth and Oropharynx as per pre-operative assessment

## 2020-11-20 ENCOUNTER — Encounter (HOSPITAL_COMMUNITY): Payer: Self-pay | Admitting: Emergency Medicine

## 2020-11-20 ENCOUNTER — Emergency Department (HOSPITAL_COMMUNITY): Payer: Medicare Other

## 2020-11-20 ENCOUNTER — Inpatient Hospital Stay (HOSPITAL_COMMUNITY): Payer: Medicare Other

## 2020-11-20 ENCOUNTER — Inpatient Hospital Stay (HOSPITAL_COMMUNITY)
Admission: EM | Admit: 2020-11-20 | Discharge: 2020-11-22 | DRG: 072 | Disposition: A | Discharge status: Home / Self Care | Payer: Medicare Other | Attending: Internal Medicine | Admitting: Internal Medicine

## 2020-11-20 ENCOUNTER — Other Ambulatory Visit: Payer: Self-pay

## 2020-11-20 DIAGNOSIS — W19XXXA Unspecified fall, initial encounter: Secondary | ICD-10-CM | POA: Diagnosis present

## 2020-11-20 DIAGNOSIS — R29810 Facial weakness: Secondary | ICD-10-CM | POA: Diagnosis present

## 2020-11-20 DIAGNOSIS — I083 Combined rheumatic disorders of mitral, aortic and tricuspid valves: Secondary | ICD-10-CM | POA: Diagnosis present

## 2020-11-20 DIAGNOSIS — R55 Syncope and collapse: Secondary | ICD-10-CM | POA: Diagnosis present

## 2020-11-20 DIAGNOSIS — Z20822 Contact with and (suspected) exposure to covid-19: Secondary | ICD-10-CM | POA: Diagnosis present

## 2020-11-20 DIAGNOSIS — G9340 Encephalopathy, unspecified: Secondary | ICD-10-CM | POA: Diagnosis not present

## 2020-11-20 DIAGNOSIS — Z79899 Other long term (current) drug therapy: Secondary | ICD-10-CM | POA: Diagnosis not present

## 2020-11-20 DIAGNOSIS — T1490XA Injury, unspecified, initial encounter: Secondary | ICD-10-CM | POA: Diagnosis present

## 2020-11-20 DIAGNOSIS — I252 Old myocardial infarction: Secondary | ICD-10-CM | POA: Diagnosis not present

## 2020-11-20 DIAGNOSIS — Z888 Allergy status to other drugs, medicaments and biological substances status: Secondary | ICD-10-CM | POA: Diagnosis not present

## 2020-11-20 DIAGNOSIS — E039 Hypothyroidism, unspecified: Secondary | ICD-10-CM | POA: Diagnosis present

## 2020-11-20 DIAGNOSIS — R4182 Altered mental status, unspecified: Secondary | ICD-10-CM | POA: Diagnosis not present

## 2020-11-20 DIAGNOSIS — Z79890 Hormone replacement therapy (postmenopausal): Secondary | ICD-10-CM

## 2020-11-20 DIAGNOSIS — I10 Essential (primary) hypertension: Secondary | ICD-10-CM | POA: Diagnosis present

## 2020-11-20 DIAGNOSIS — Z7982 Long term (current) use of aspirin: Secondary | ICD-10-CM

## 2020-11-20 DIAGNOSIS — E785 Hyperlipidemia, unspecified: Secondary | ICD-10-CM | POA: Diagnosis present

## 2020-11-20 DIAGNOSIS — Z9049 Acquired absence of other specified parts of digestive tract: Secondary | ICD-10-CM | POA: Diagnosis not present

## 2020-11-20 DIAGNOSIS — Z882 Allergy status to sulfonamides status: Secondary | ICD-10-CM

## 2020-11-20 DIAGNOSIS — Z885 Allergy status to narcotic agent status: Secondary | ICD-10-CM | POA: Diagnosis not present

## 2020-11-20 DIAGNOSIS — R32 Unspecified urinary incontinence: Secondary | ICD-10-CM | POA: Diagnosis present

## 2020-11-20 DIAGNOSIS — G9349 Other encephalopathy: Secondary | ICD-10-CM | POA: Diagnosis present

## 2020-11-20 DIAGNOSIS — R410 Disorientation, unspecified: Secondary | ICD-10-CM

## 2020-11-20 DIAGNOSIS — D649 Anemia, unspecified: Secondary | ICD-10-CM | POA: Diagnosis present

## 2020-11-20 DIAGNOSIS — I639 Cerebral infarction, unspecified: Secondary | ICD-10-CM

## 2020-11-20 LAB — URINALYSIS, ROUTINE W REFLEX MICROSCOPIC
Bacteria, UA: NONE SEEN
Bilirubin Urine: NEGATIVE
Glucose, UA: NEGATIVE mg/dL
Ketones, ur: NEGATIVE mg/dL
Leukocytes,Ua: NEGATIVE
Nitrite: NEGATIVE
Protein, ur: NEGATIVE mg/dL
Specific Gravity, Urine: 1.015 (ref 1.005–1.030)
pH: 5 (ref 5.0–8.0)

## 2020-11-20 LAB — DIFFERENTIAL
Abs Immature Granulocytes: 0.08 10*3/uL — ABNORMAL HIGH (ref 0.00–0.07)
Basophils Absolute: 0 10*3/uL (ref 0.0–0.1)
Basophils Relative: 0 %
Eosinophils Absolute: 0 10*3/uL (ref 0.0–0.5)
Eosinophils Relative: 0 %
Immature Granulocytes: 2 %
Lymphocytes Relative: 23 %
Lymphs Abs: 1.1 10*3/uL (ref 0.7–4.0)
Monocytes Absolute: 0.8 10*3/uL (ref 0.1–1.0)
Monocytes Relative: 17 %
Neutro Abs: 2.7 10*3/uL (ref 1.7–7.7)
Neutrophils Relative %: 58 %

## 2020-11-20 LAB — I-STAT CHEM 8, ED
BUN: 18 mg/dL (ref 8–23)
Calcium, Ion: 1.22 mmol/L (ref 1.15–1.40)
Chloride: 101 mmol/L (ref 98–111)
Creatinine, Ser: 1.1 mg/dL — ABNORMAL HIGH (ref 0.44–1.00)
Glucose, Bld: 113 mg/dL — ABNORMAL HIGH (ref 70–99)
HCT: 34 % — ABNORMAL LOW (ref 36.0–46.0)
Hemoglobin: 11.6 g/dL — ABNORMAL LOW (ref 12.0–15.0)
Potassium: 4.3 mmol/L (ref 3.5–5.1)
Sodium: 138 mmol/L (ref 135–145)
TCO2: 28 mmol/L (ref 22–32)

## 2020-11-20 LAB — TROPONIN I (HIGH SENSITIVITY)
Troponin I (High Sensitivity): 17 ng/L (ref <18)
Troponin I (High Sensitivity): 19 ng/L — ABNORMAL HIGH (ref <18)

## 2020-11-20 LAB — COMPREHENSIVE METABOLIC PANEL
ALT: 13 U/L (ref 0–44)
AST: 22 U/L (ref 15–41)
Albumin: 4.2 g/dL (ref 3.5–5.0)
Alkaline Phosphatase: 48 U/L (ref 38–126)
Anion gap: 5 (ref 5–15)
BUN: 19 mg/dL (ref 8–23)
CO2: 28 mmol/L (ref 22–32)
Calcium: 8.5 mg/dL — ABNORMAL LOW (ref 8.9–10.3)
Chloride: 101 mmol/L (ref 98–111)
Creatinine, Ser: 1.08 mg/dL — ABNORMAL HIGH (ref 0.44–1.00)
GFR, Estimated: 52 mL/min — ABNORMAL LOW (ref >60)
Glucose, Bld: 118 mg/dL — ABNORMAL HIGH (ref 70–99)
Potassium: 4.1 mmol/L (ref 3.5–5.1)
Sodium: 134 mmol/L — ABNORMAL LOW (ref 135–145)
Total Bilirubin: 0.6 mg/dL (ref 0.3–1.2)
Total Protein: 6.8 g/dL (ref 6.5–8.1)

## 2020-11-20 LAB — RESP PANEL BY RT-PCR (FLU A&B, COVID) ARPGX2
Influenza A by PCR: NEGATIVE
Influenza B by PCR: NEGATIVE
SARS Coronavirus 2 by RT PCR: NEGATIVE

## 2020-11-20 LAB — CBC
HCT: 34.3 % — ABNORMAL LOW (ref 36.0–46.0)
HCT: 34.4 % — ABNORMAL LOW (ref 36.0–46.0)
Hemoglobin: 11.3 g/dL — ABNORMAL LOW (ref 12.0–15.0)
Hemoglobin: 11.4 g/dL — ABNORMAL LOW (ref 12.0–15.0)
MCH: 30.3 pg (ref 26.0–34.0)
MCH: 30.2 pg (ref 26.0–34.0)
MCHC: 32.9 g/dL (ref 30.0–36.0)
MCHC: 33.1 g/dL (ref 30.0–36.0)
MCV: 92 fL (ref 80.0–100.0)
MCV: 91 fL (ref 80.0–100.0)
Platelets: 133 10*3/uL — ABNORMAL LOW (ref 150–400)
Platelets: 114 10*3/uL — ABNORMAL LOW (ref 150–400)
RBC: 3.73 MIL/uL — ABNORMAL LOW (ref 3.87–5.11)
RBC: 3.78 MIL/uL — ABNORMAL LOW (ref 3.87–5.11)
RDW: 14.5 % (ref 11.5–15.5)
RDW: 14.2 % (ref 11.5–15.5)
WBC: 4.6 10*3/uL (ref 4.0–10.5)
WBC: 4.2 10*3/uL (ref 4.0–10.5)
nRBC: 0 % (ref 0.0–0.2)
nRBC: 0 % (ref 0.0–0.2)

## 2020-11-20 LAB — AMMONIA: Ammonia: 26 umol/L (ref 9–35)

## 2020-11-20 LAB — APTT: aPTT: 31 seconds (ref 24–36)

## 2020-11-20 LAB — RAPID URINE DRUG SCREEN, HOSP PERFORMED
Amphetamines: NOT DETECTED
Barbiturates: NOT DETECTED
Benzodiazepines: NOT DETECTED
Cocaine: NOT DETECTED
Opiates: NOT DETECTED
Tetrahydrocannabinol: NOT DETECTED

## 2020-11-20 LAB — PROTIME-INR
INR: 1.2 (ref 0.8–1.2)
Prothrombin Time: 14.8 seconds (ref 11.4–15.2)

## 2020-11-20 LAB — CREATININE, SERUM
Creatinine, Ser: 0.91 mg/dL (ref 0.44–1.00)
GFR, Estimated: >60 mL/min (ref >60)

## 2020-11-20 LAB — TSH: TSH: 3.533 u[IU]/mL (ref 0.350–4.500)

## 2020-11-20 LAB — CK: Total CK: 361 U/L — ABNORMAL HIGH (ref 38–234)

## 2020-11-20 LAB — ETHANOL: Alcohol, Ethyl (B): <10 mg/dL (ref <10)

## 2020-11-20 IMAGING — CT CT CERVICAL SPINE W/O CM
3 series · 13 of 33 positions shown, 16 images · IV contrast (Omnipaque or Isovue)
Comparison: CT angio head and neck [DATE]

CLINICAL DATA: Found on floor.  Probable fall.

EXAM:
CT CERVICAL SPINE WITHOUT CONTRAST
TECHNIQUE: Multidetector CT imaging of the cervical spine was performed without
intravenous contrast. Multiplanar CT image reconstructions were also
generated.

[Series 1: ax c spine · axial · 0.27mm/px · z∈[+1546,+1649]mm · 5 of 76 slices shown, 7 images]
[im 12/76  soft-tissue]
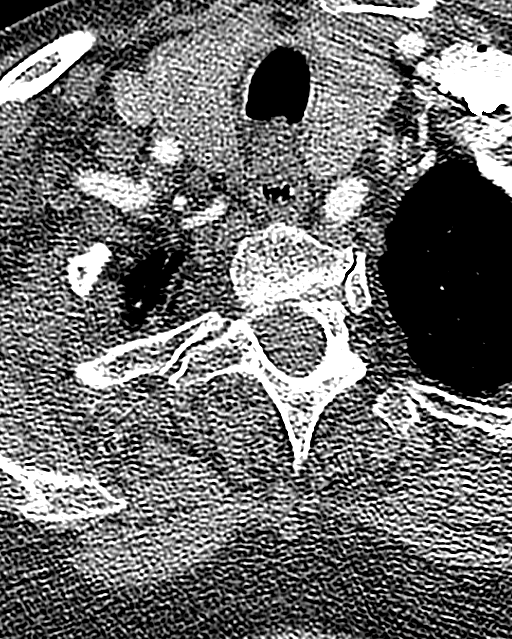
[im 12/76  bone]
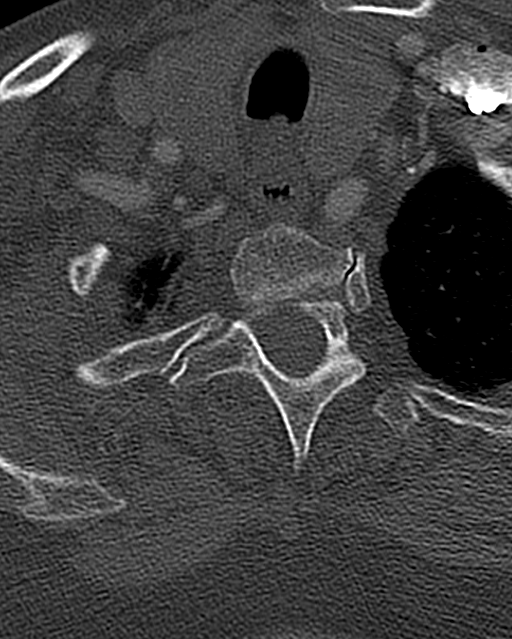
[im 24/76  bone]
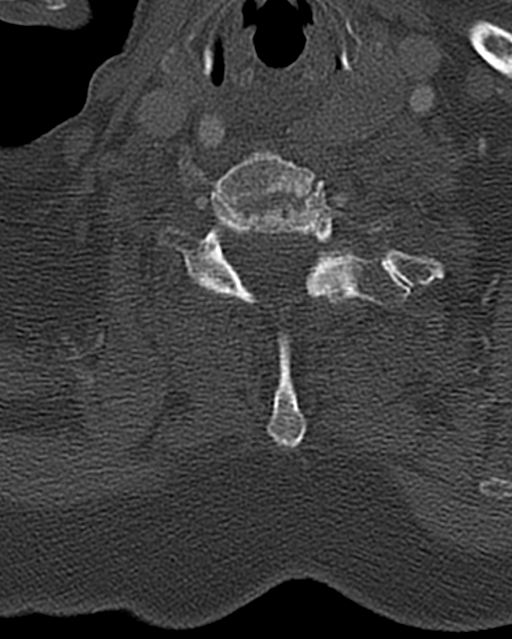
[im 41/76  bone]
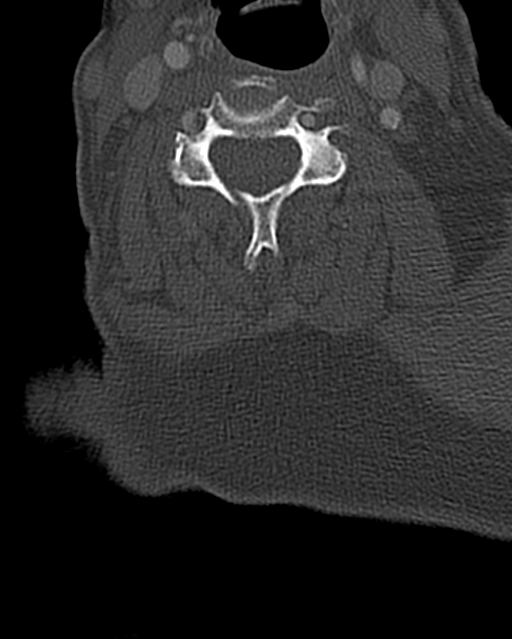
[im 52/76  bone]
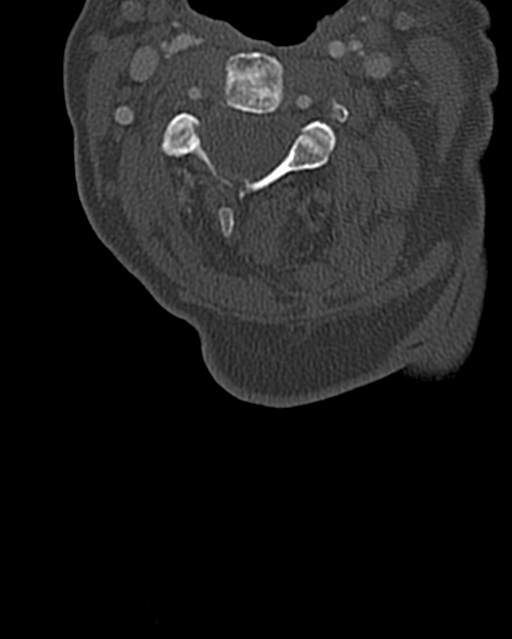
[im 64/76  soft-tissue]
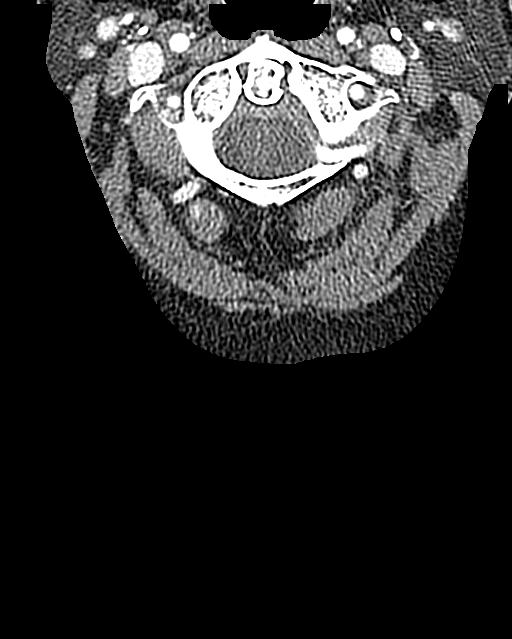
[im 64/76  bone]
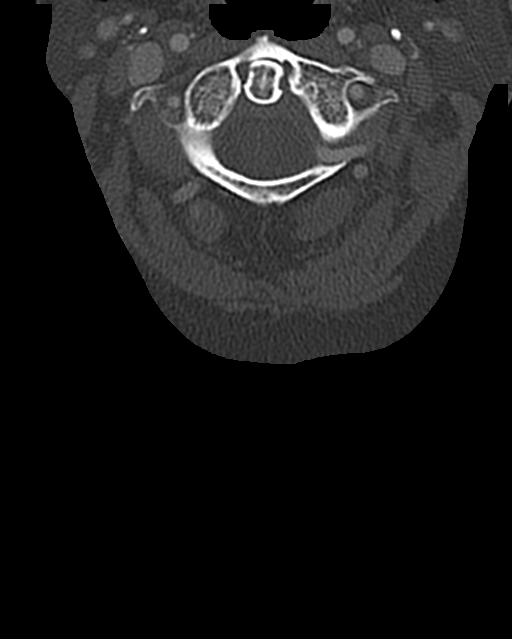

[Series 2: cor c spine · coronal · 0.24mm/px · 3 of 70 slices shown]
[im 14/70  bone]
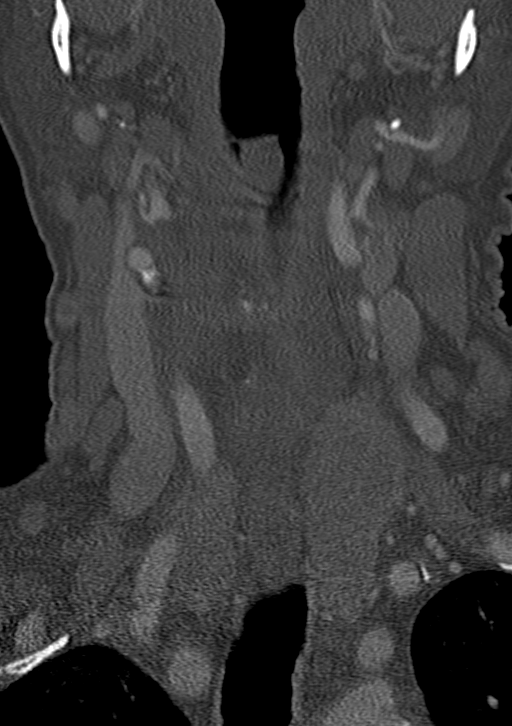
[im 28/70  bone]
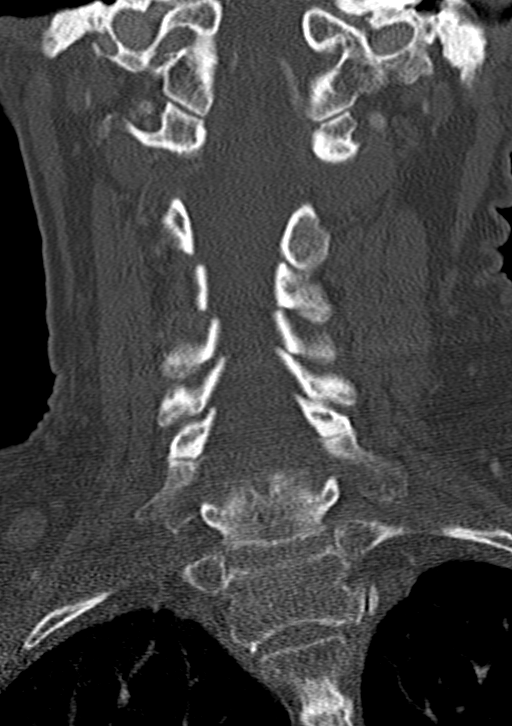
[im 42/70  bone]
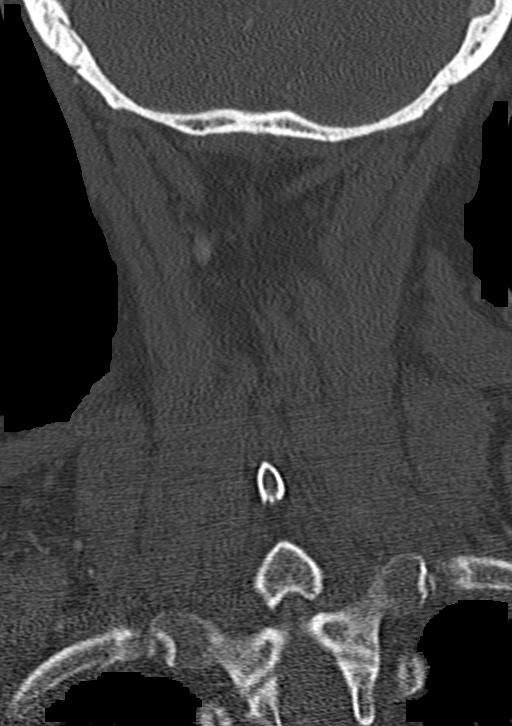

[Series 3: sag c spine · sagittal · 0.30mm/px · 5 of 60 slices shown, 6 images]
[im 20/60  bone]
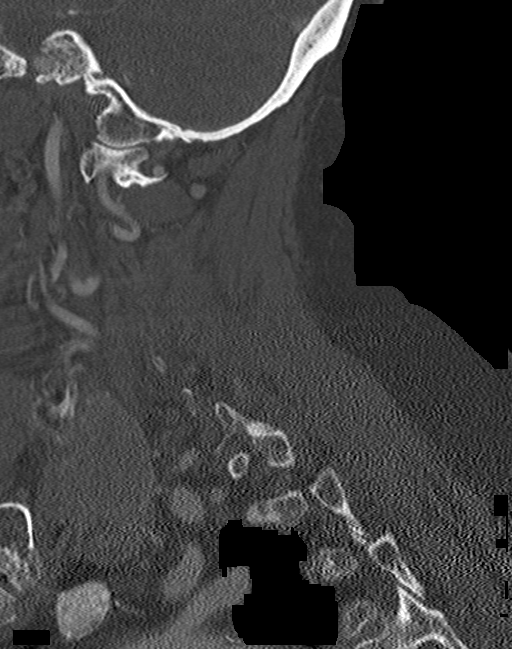
[im 25/60  bone]
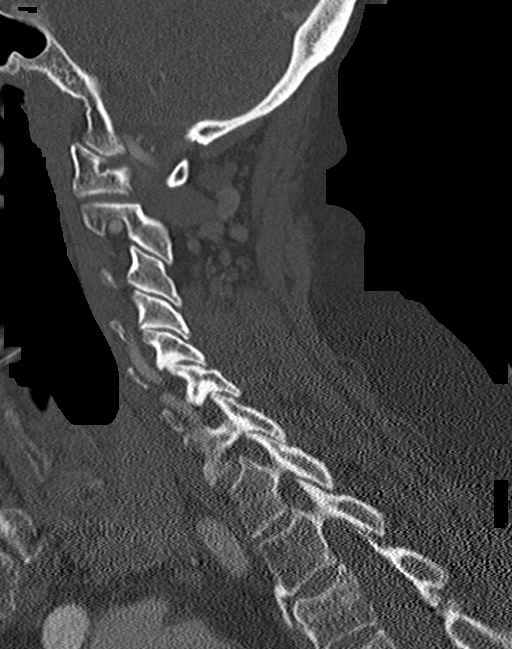
[im 30/60  soft-tissue]
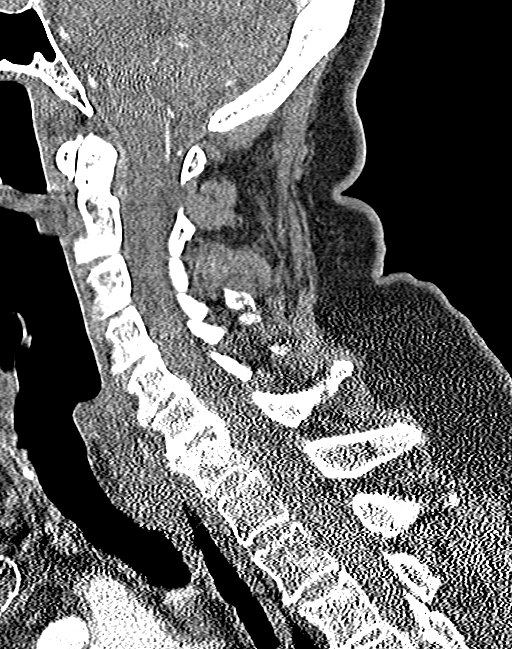
[im 30/60  bone]
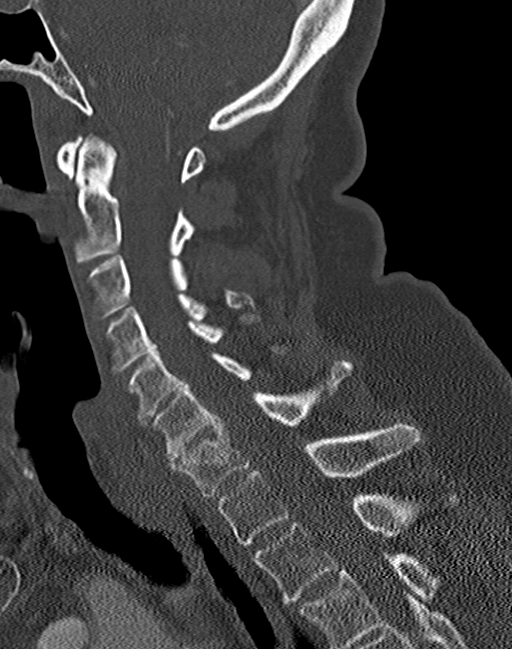
[im 35/60  bone]
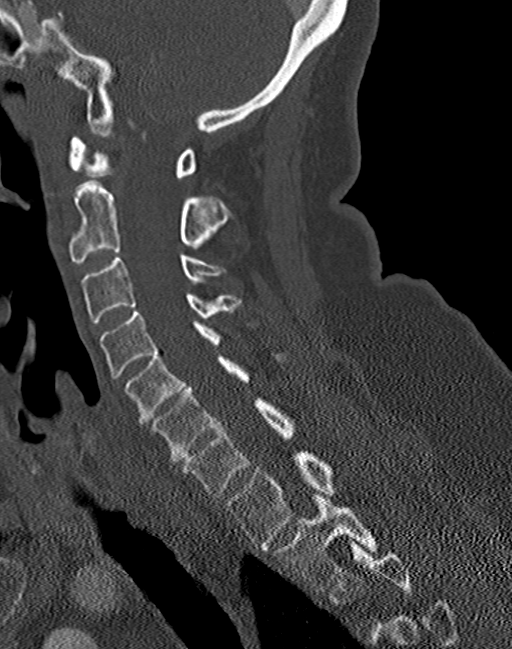
[im 40/60  bone]
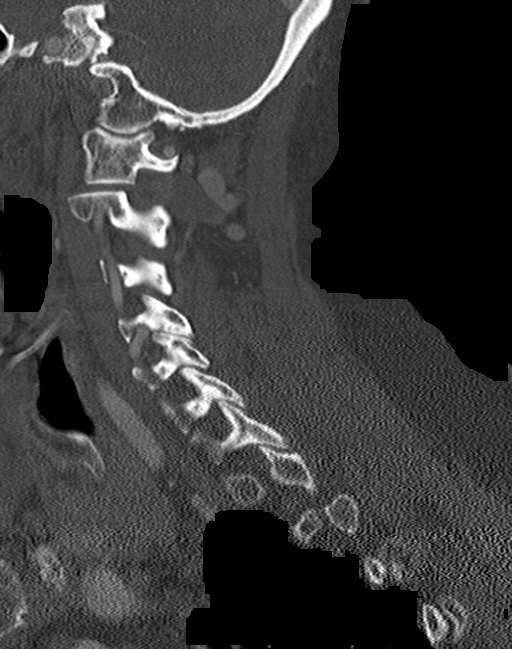

[13 of 33 positions shown; findings below may reference images not displayed]

FINDINGS: Alignment: Mild anterolisthesis C5-6

Skull base and vertebrae: Negative for fracture or mass lesion.

Soft tissues and spinal canal: Mild thyromegaly. No soft tissue mass
in the neck.

Disc levels: Mild disc degeneration and spurring most prominent C5-6
and C6-7. No significant spinal stenosis.

Upper chest: Lung apices clear bilaterally.

Other: None
IMPRESSION: Negative for cervical fracture.

## 2020-11-20 IMAGING — DX DG CHEST 2V
2 series · 2 of 2 positions shown · non-contrast
Comparison: [DATE].

CLINICAL DATA: Fall.

EXAM:
CHEST - 2 VIEW

[chest ap]
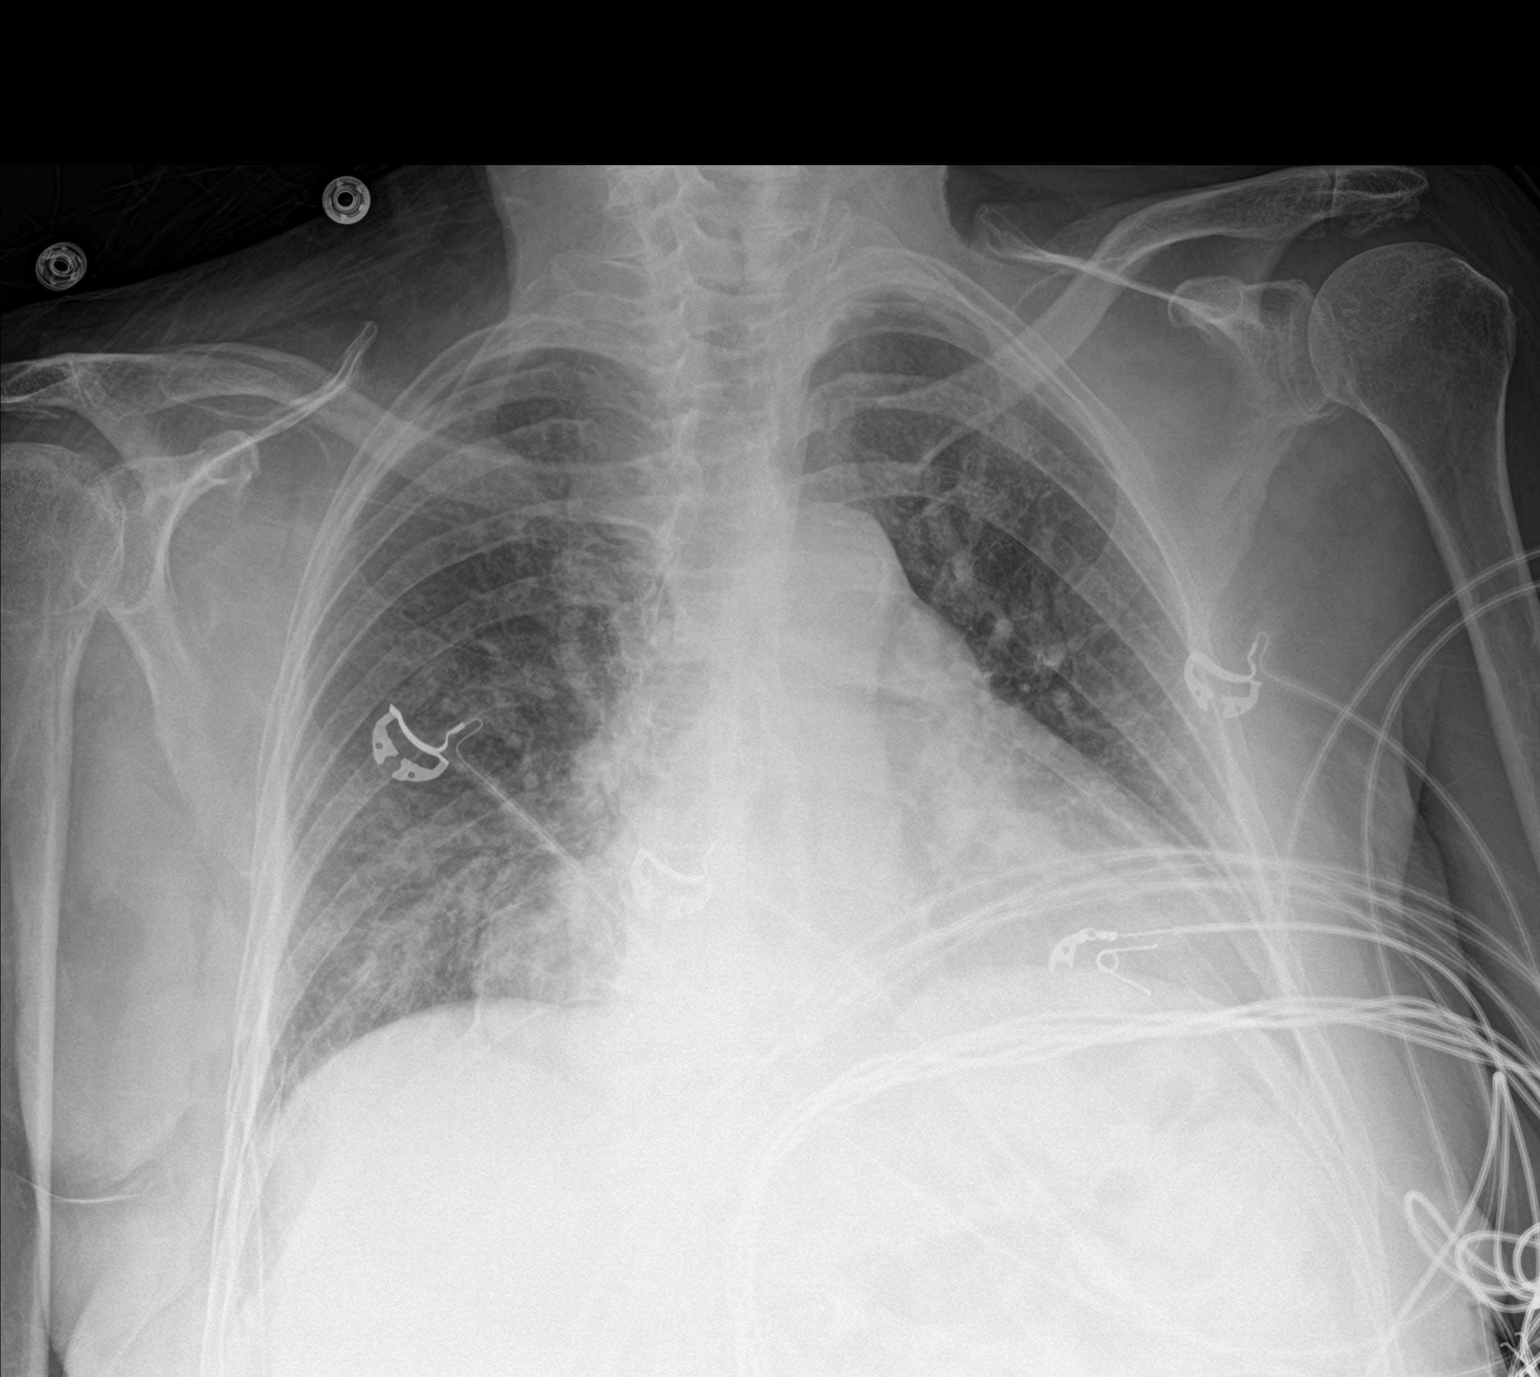

[chest lat]
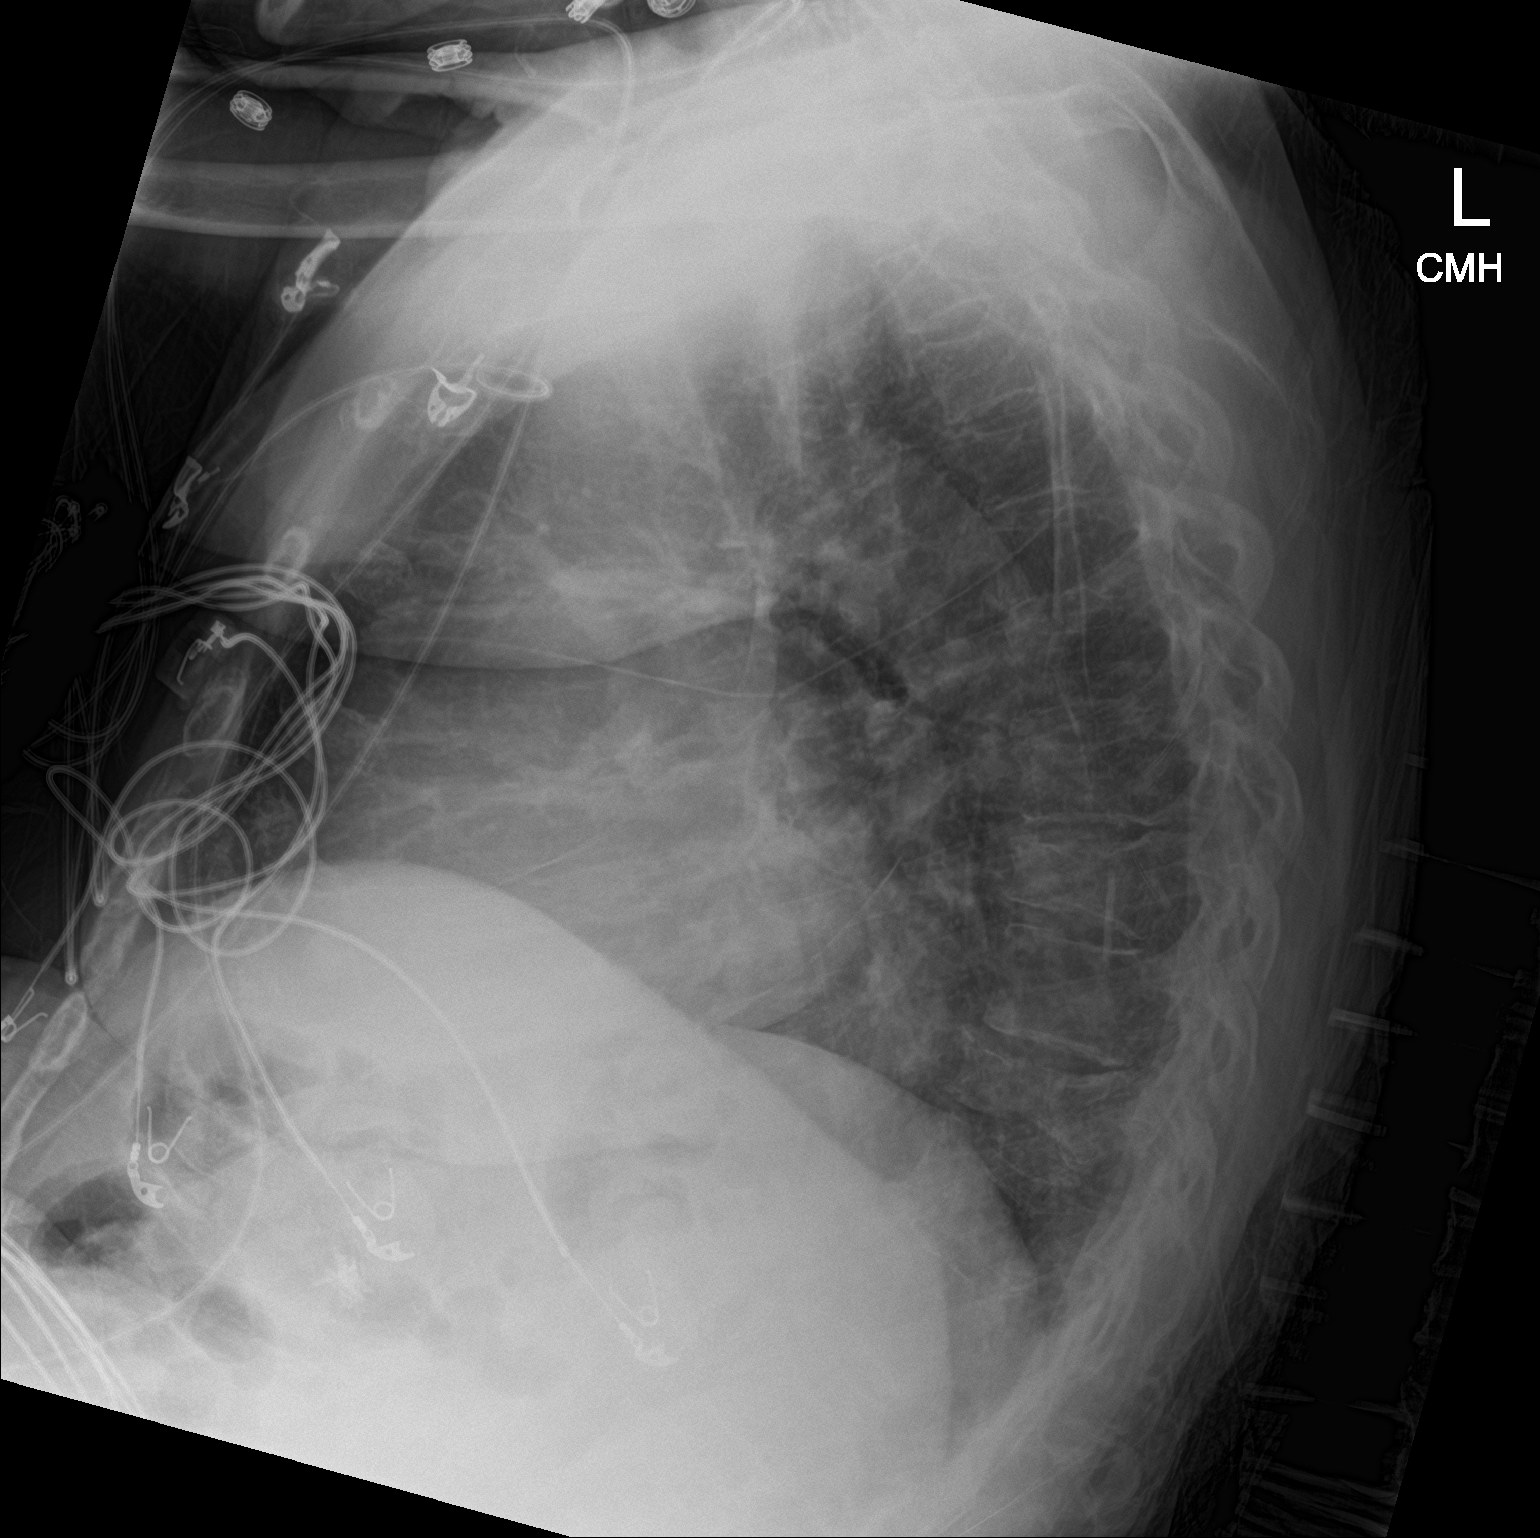

[2 of 2 positions shown; findings below may reference images not displayed]

FINDINGS: Stable cardiomegaly. No pneumothorax or pleural effusion is noted.
Mild central pulmonary vascular may be present with possible
bibasilar atelectasis or edema. No acute bony abnormality is noted.
IMPRESSION: Stable cardiomegaly with mild central pulmonary vascular congestion.
Possible bibasilar subsegmental atelectasis or edema is noted.

## 2020-11-20 IMAGING — DX DG PELVIS 1-2V
1 series · 1 of 1 positions shown · non-contrast
Comparison: None.

CLINICAL DATA: Status post fall.

EXAM:
PELVIS - 1-2 VIEW

[pelvis ap]
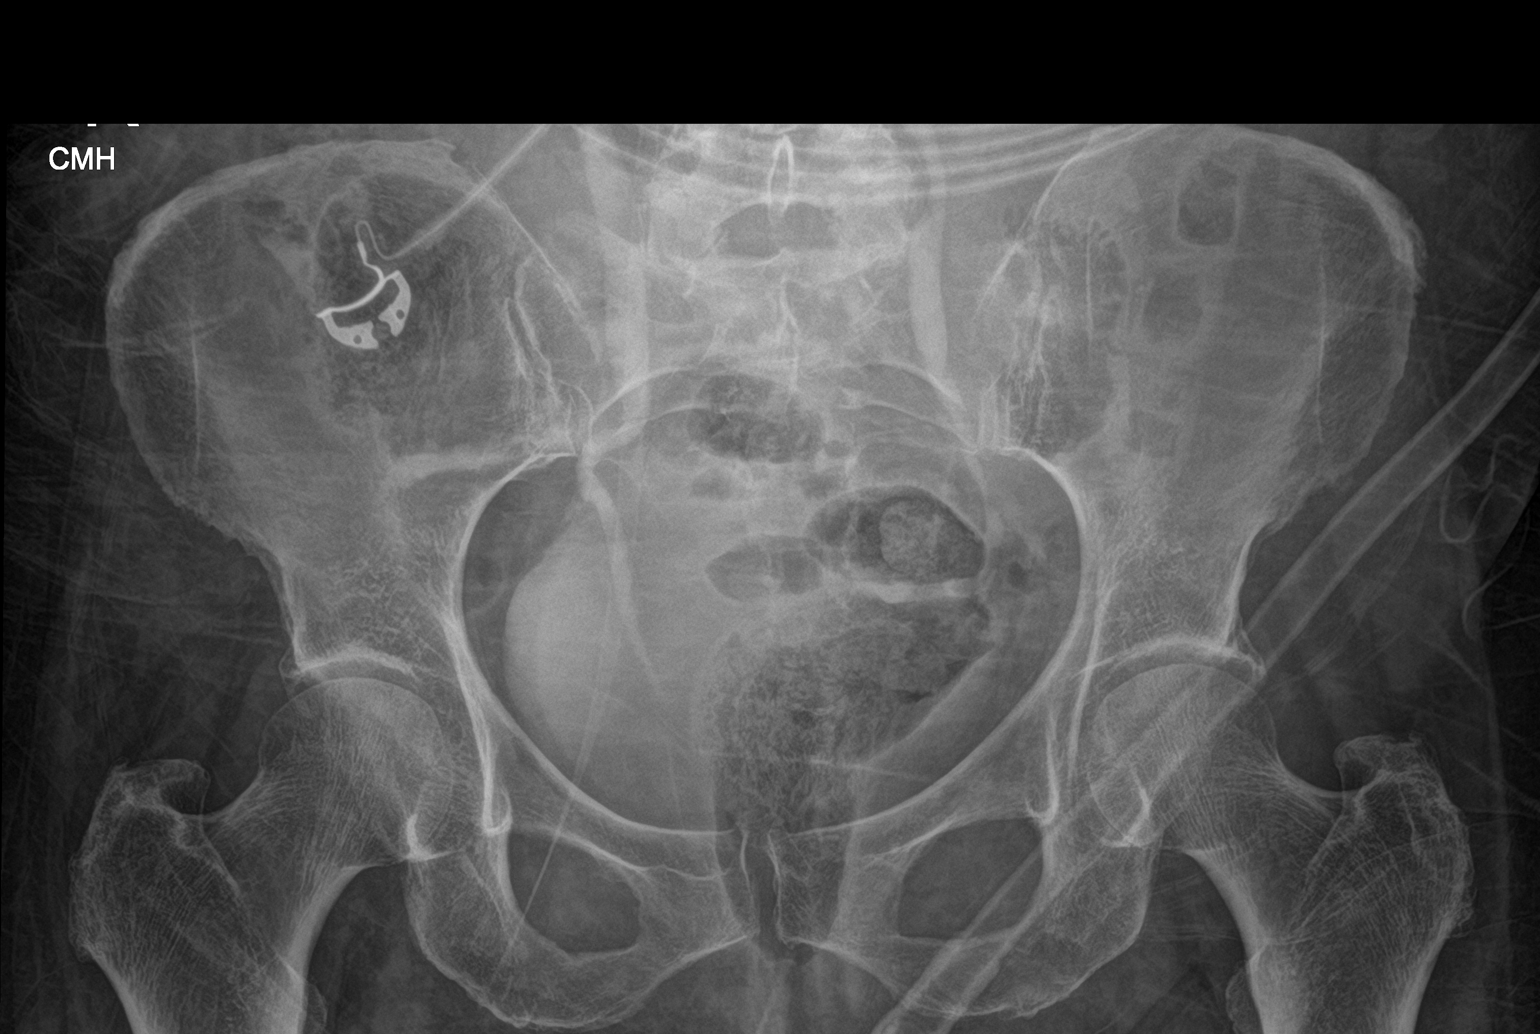

[1 of 1 positions shown; findings below may reference images not displayed]

FINDINGS: There is no evidence of pelvic fracture or diastasis. No pelvic bone
lesions are seen. Contrast in the ureters and bladder from the
patient's CT angiogram of the head and neck today noted.
IMPRESSION: No acute abnormality.

## 2020-11-20 IMAGING — MR MR HEAD W/O CM
9 of 10 series · 39 of 48 positions shown · non-contrast
Comparison: CT head [DATE]

CLINICAL DATA: Mental status change

EXAM:
MRI HEAD WITHOUT CONTRAST
TECHNIQUE: Multiplanar, multiecho pulse sequences of the brain and surrounding
structures were obtained without intravenous contrast.

[Series 5: DWI · axial · 4.0mm · 0.88mm/px · z∈[-57,+82]mm · 5 of 36 slices shown (1 of 4)]
[im 1/36]
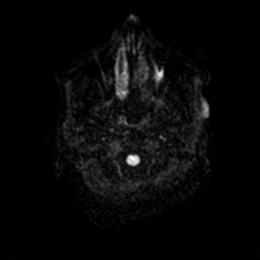
[im 9/36]
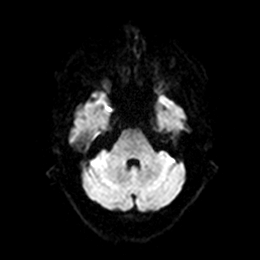
[im 18/36]
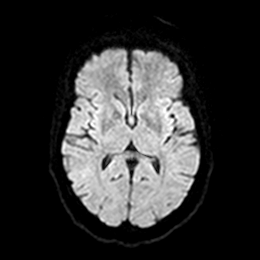
[im 27/36]
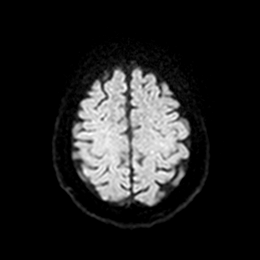
[im 36/36]
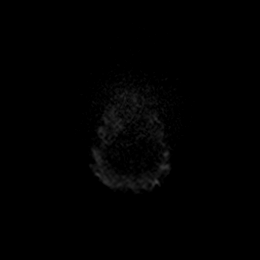

[Series 6: DWI · axial · 4.0mm · 0.88mm/px · z∈[-57,+82]mm · 5 of 36 slices shown (2 of 4)]
[im 1/36]
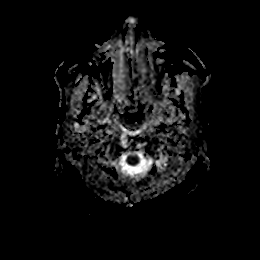
[im 9/36]
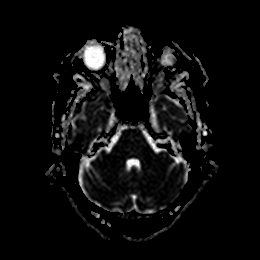
[im 18/36]
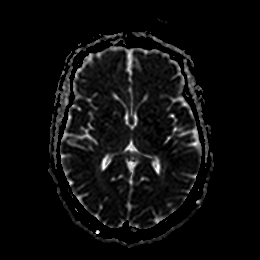
[im 27/36]
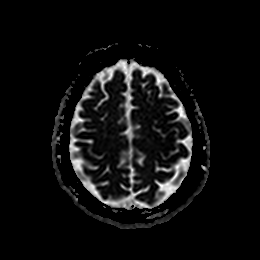
[im 36/36]
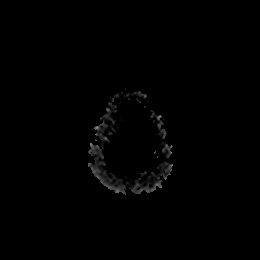

[Series 7: DWI · coronal · 4.0mm · 0.88mm/px · 5 of 32 slices shown (3 of 4)]
[im 1/32]
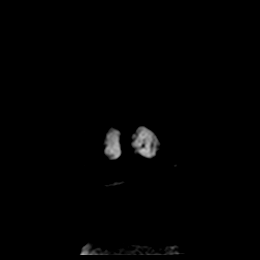
[im 8/32]
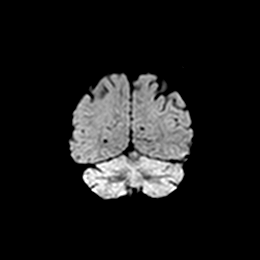
[im 16/32]
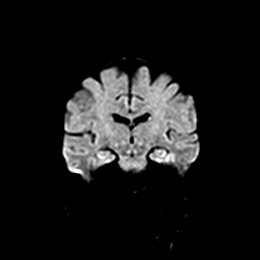
[im 24/32]
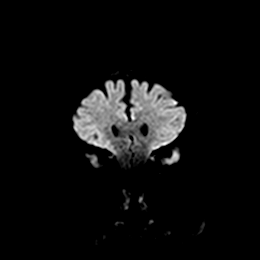
[im 32/32]
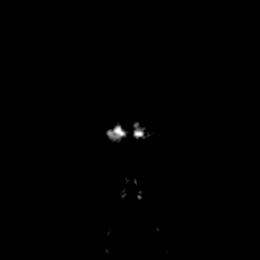

[Series 8: DWI · coronal · 4.0mm · 0.88mm/px · 5 of 32 slices shown (4 of 4)]
[im 1/32]
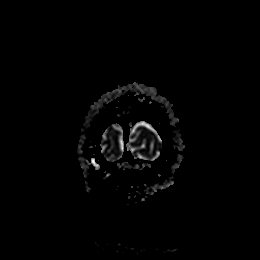
[im 8/32]
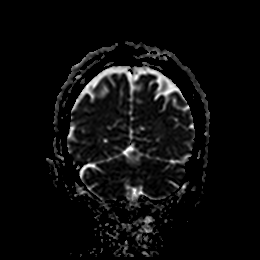
[im 16/32]
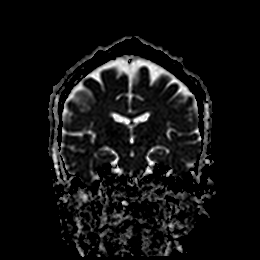
[im 24/32]
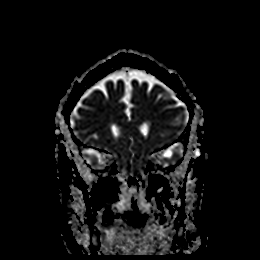
[im 32/32]
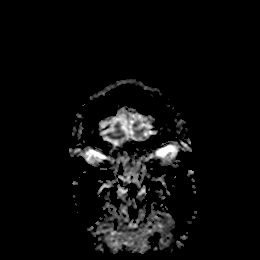

[Series 9: T1 · sagittal · 5.0mm · 0.80mm/px · 3 of 23 slices shown]
[im 1/23]
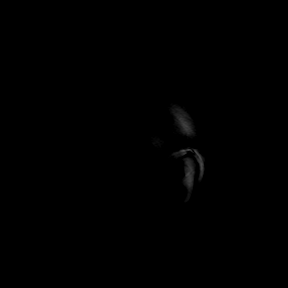
[im 12/23]
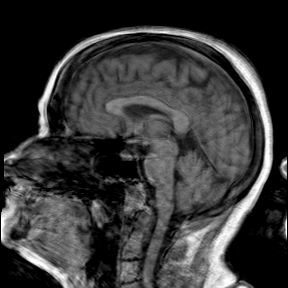
[im 23/23]
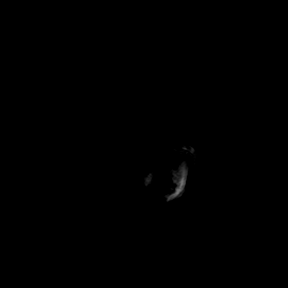

[Series 10: T2 · axial · 5.0mm · 0.72mm/px · z∈[-64,+89]mm · 3 of 23 slices shown (1 of 2)]
[im 1/23]
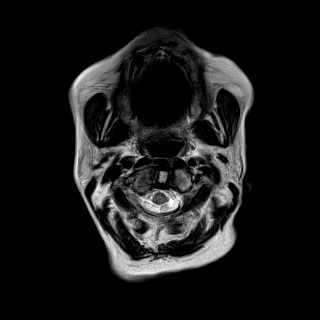
[im 12/23]
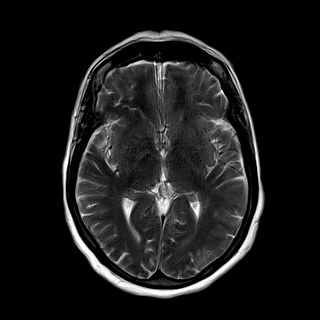
[im 23/23]
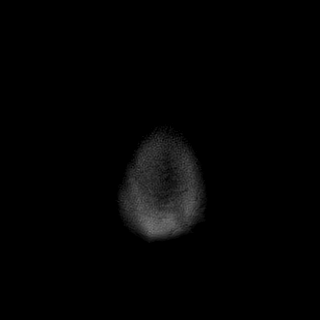

[Series 11: ax hemo · axial · 5.0mm · 0.86mm/px · z∈[-60,+84]mm · 4 of 25 slices shown]
[im 1/25]
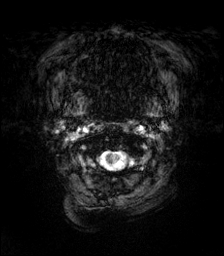
[im 9/25]
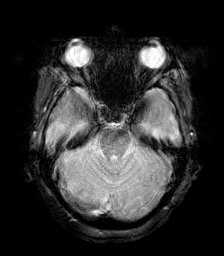
[im 17/25]
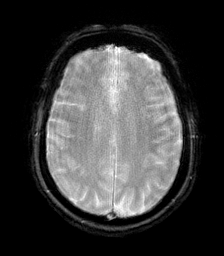
[im 25/25]
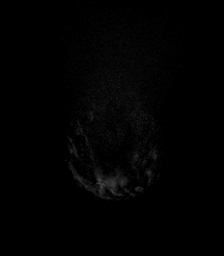

[Series 12: FLAIR · axial · 4.0mm · 0.43mm/px · z∈[-62,+86]mm · 5 of 38 slices shown]
[im 1/38]
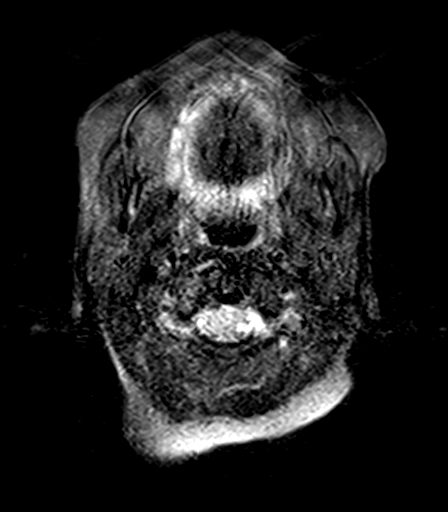
[im 10/38]
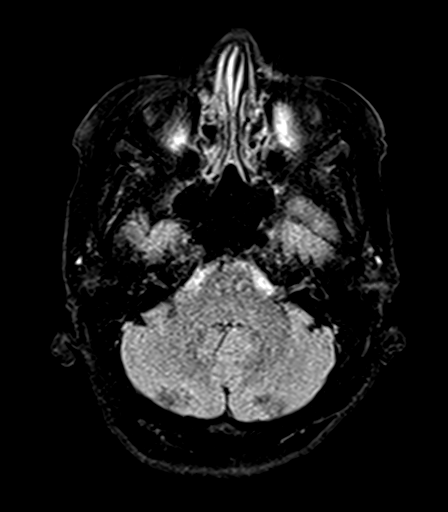
[im 19/38]
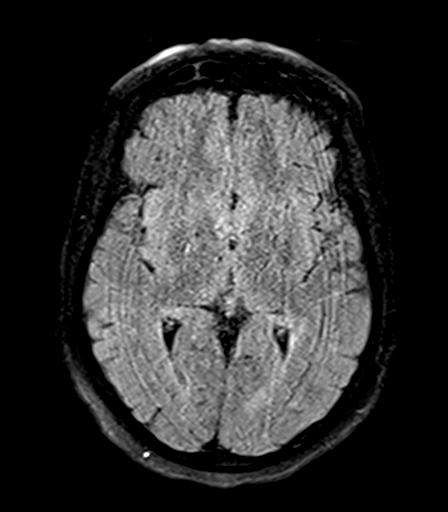
[im 28/38]
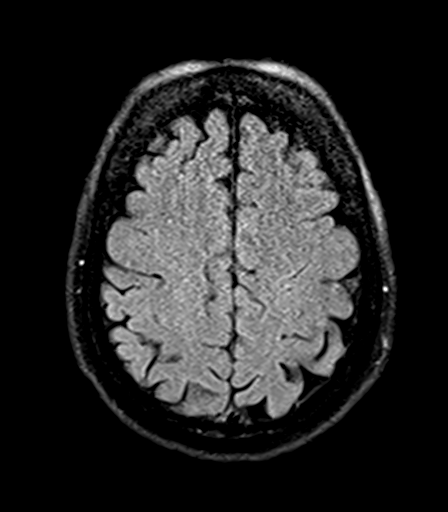
[im 38/38]
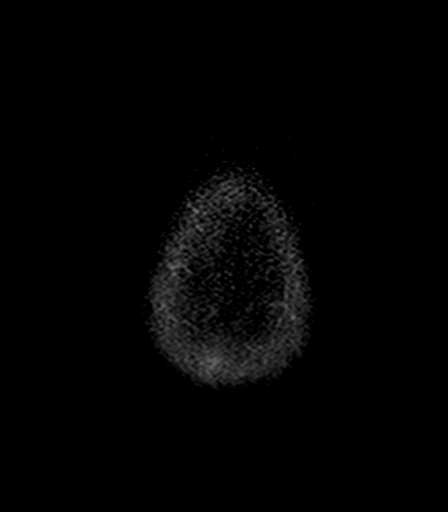

[Series 14: T2 · coronal · 5.0mm · 0.72mm/px · 4 of 28 slices shown (2 of 2)]
[im 1/28]
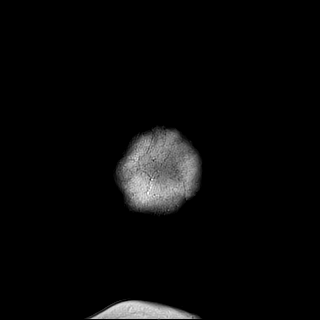
[im 10/28]
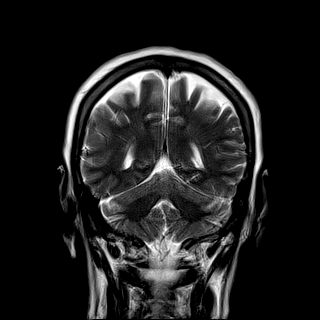
[im 19/28]
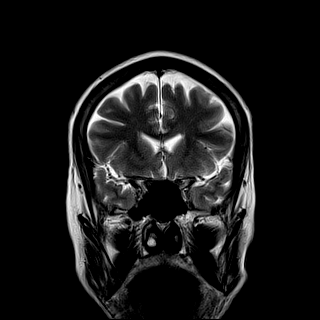
[im 28/28]
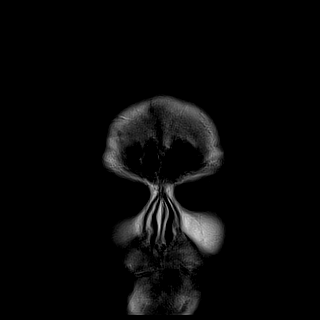

[39 of 48 positions shown; findings below may reference images not displayed]

FINDINGS: Brain: Motion degraded study

Negative for acute infarct. Negative for hemorrhage or mass.
Ventricle size normal. Mild white matter FLAIR hyperintensity
bilaterally. Brainstem intact.

Vascular: Normal arterial flow voids

Skull and upper cervical spine: Negative

Sinuses/Orbits: Mild mucosal edema paranasal sinuses. Negative orbit

Other: None
IMPRESSION: No acute abnormality.  Motion degraded study

## 2020-11-20 IMAGING — DX DG KNEE COMPLETE 4+V*R*
4 series · 4 of 4 positions shown · non-contrast
Comparison: None.

CLINICAL DATA: Right knee pain after fall.

EXAM:
RIGHT KNEE - COMPLETE 4+ VIEW

[knee obl (1 of 2)]
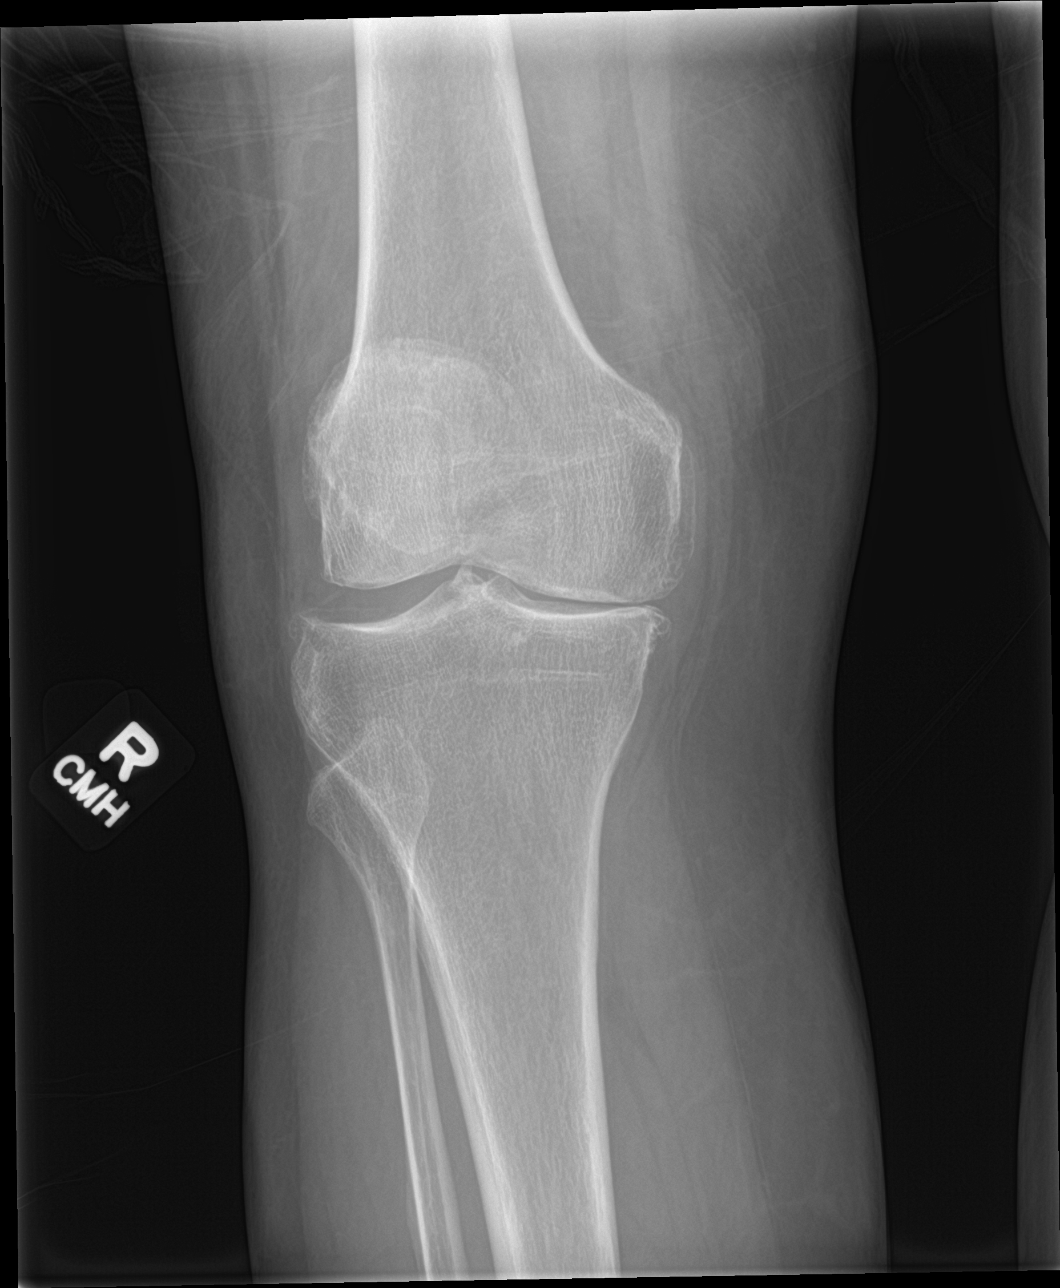

[knee ap]
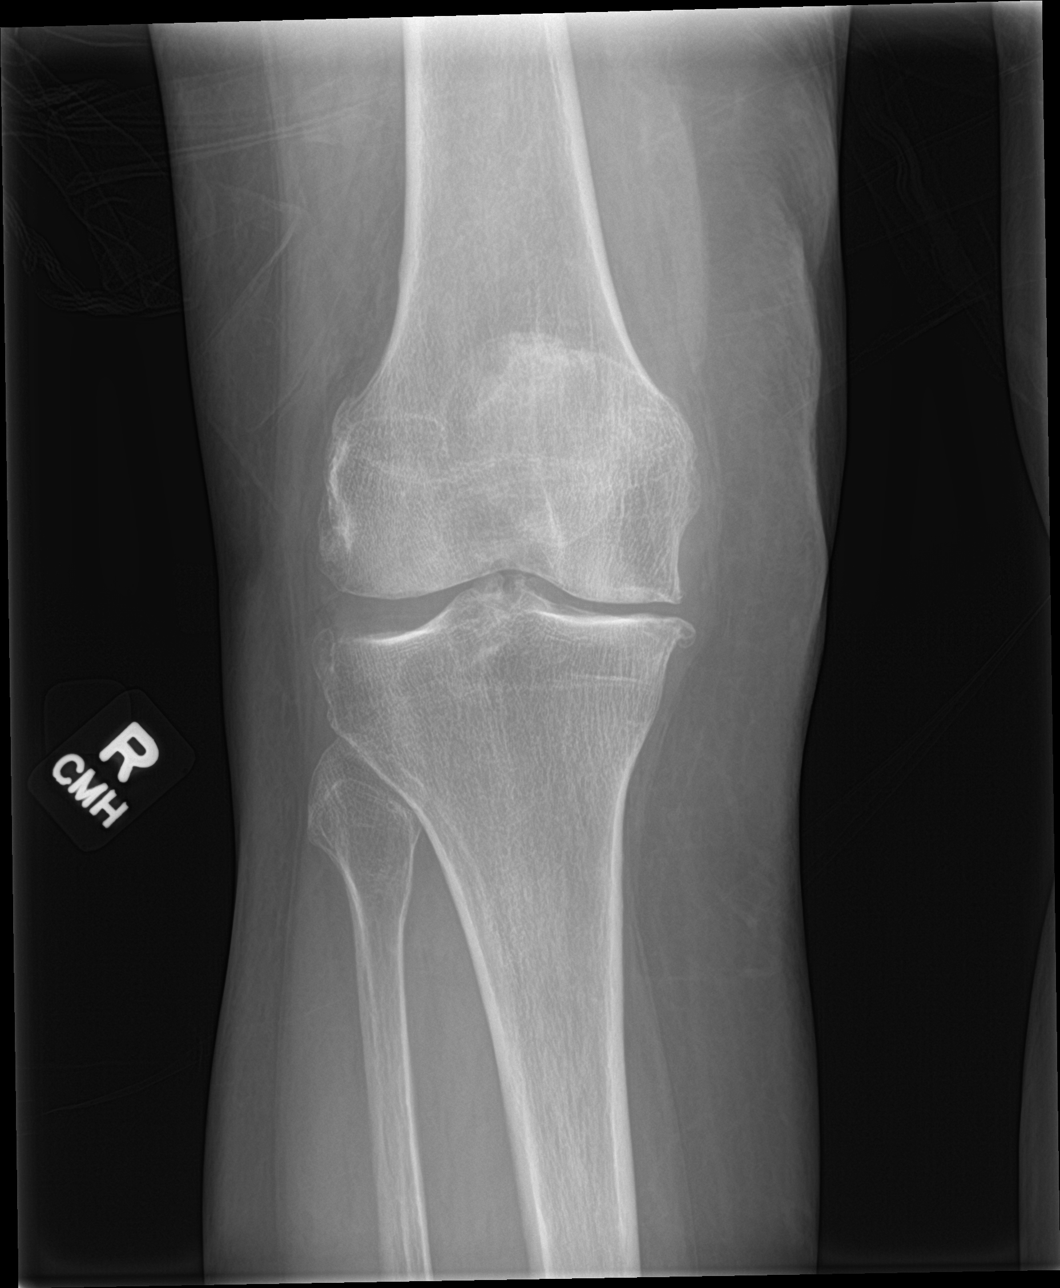

[knee obl (2 of 2)]
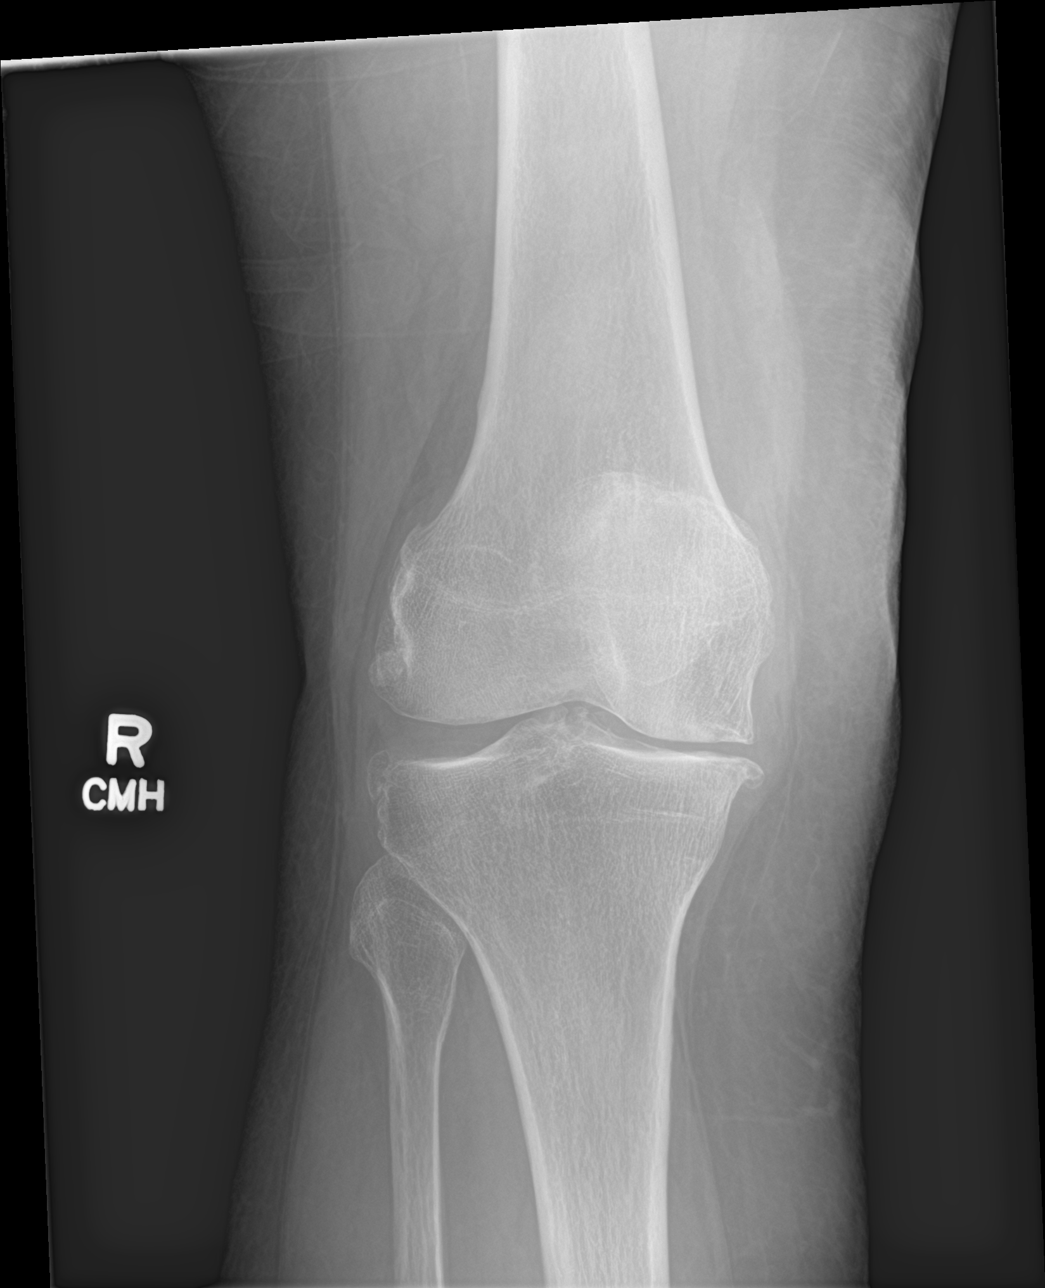

[knee lat]
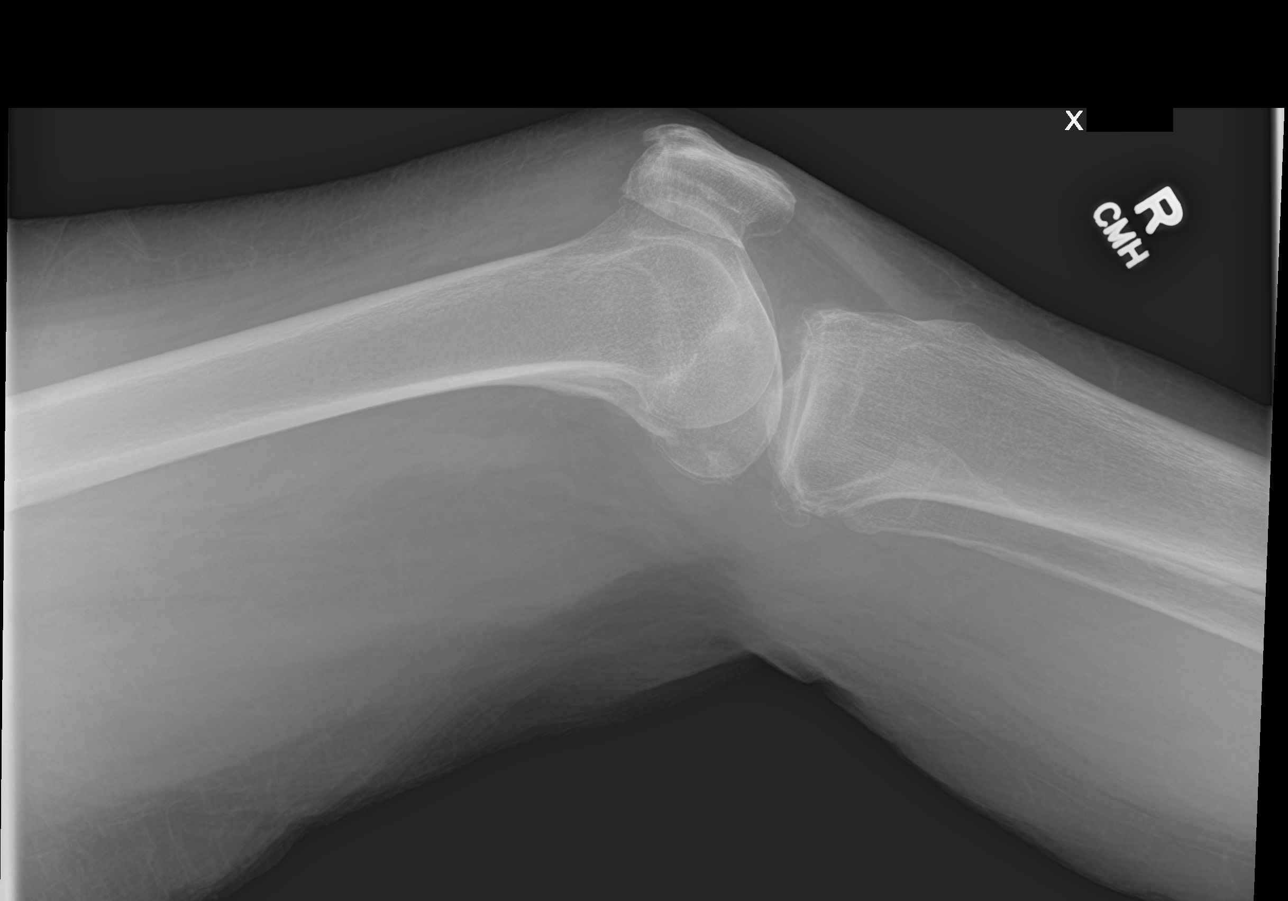

[4 of 4 positions shown; findings below may reference images not displayed]

FINDINGS: No evidence of fracture, dislocation, or joint effusion. Moderate to
severe narrowing of medial joint space is noted. Soft tissues are
unremarkable.
IMPRESSION: Moderate to severe degenerative joint disease is noted medially. No
acute abnormality seen.

## 2020-11-20 IMAGING — CT CT ANGIO HEAD
2 of 11 series · 7 of 33 positions shown · IV contrast (omnipaque)
Comparison: None.

CLINICAL DATA: Stroke/TIA.  Found down.

EXAM:
CT HEAD WITHOUT CONTRAST
CT ANGIOGRAPHY OF THE HEAD AND NECK
TECHNIQUE: Contiguous axial images were obtained from the base of the skull
through the vertex without intravenous contrast. Multidetector CT
imaging of the head and neck was performed using the standard
protocol during bolus administration of intravenous contrast.
Multiplanar CT image reconstructions and MIPs were obtained to
evaluate the vascular anatomy. Carotid stenosis measurements (when
applicable) are obtained utilizing NASCET criteria, using the distal
internal carotid diameter as the denominator.
CONTRAST:  50mL OMNIPAQUE IOHEXOL 350 MG/ML SOLN

[Series 9: cta head & neck · axial · 0.45mm/px · z∈[+1584,+1692]mm · 2 of 164 slices shown]
[im 55/164  soft-tissue]
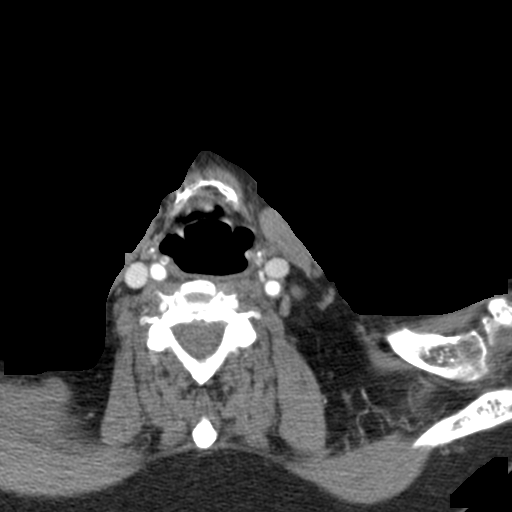
[im 109/164  soft-tissue]
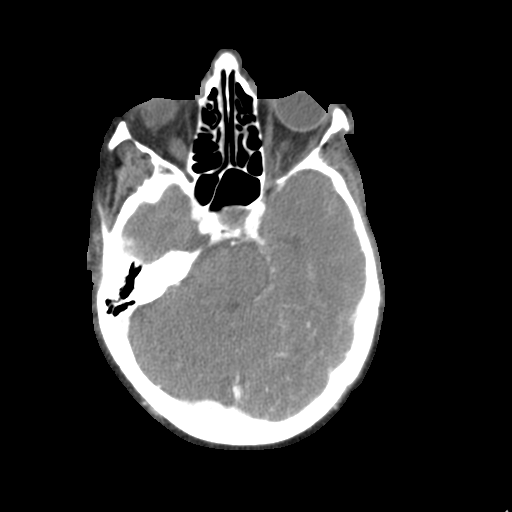

[Series 11: ax thins · axial · 0.39mm/px · z∈[+1537,+1754]mm · 5 of 328 slices shown]
[im 55/328  soft-tissue]
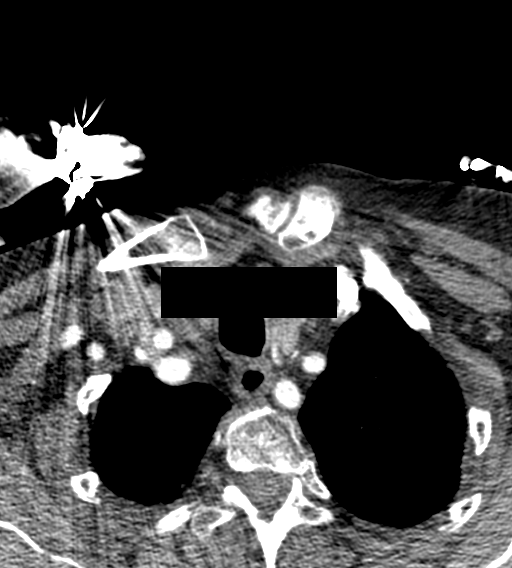
[im 110/328  bone]
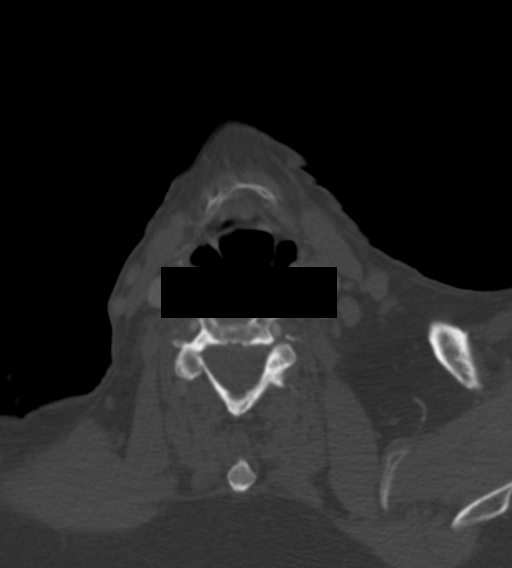
[im 164/328  soft-tissue]
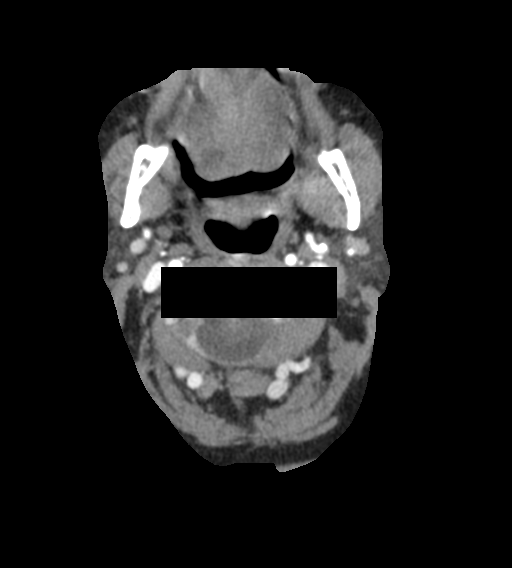
[im 219/328  bone]
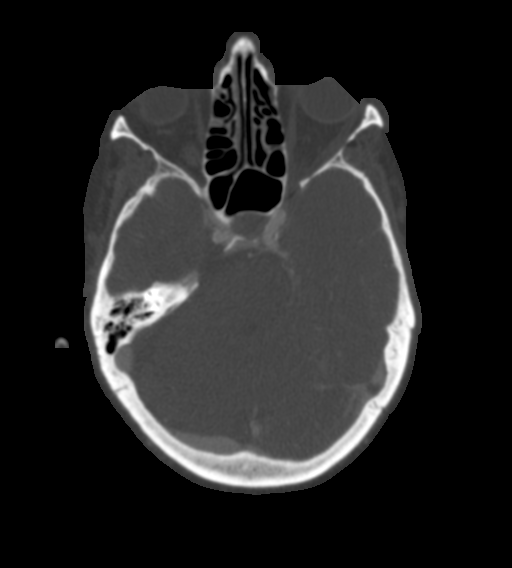
[im 273/328  soft-tissue]
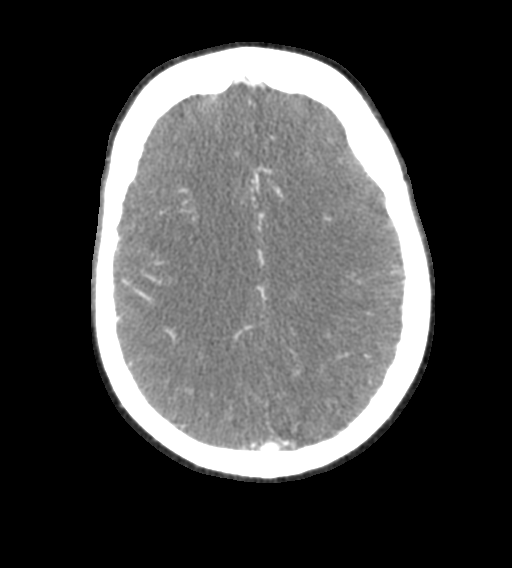

[7 of 33 positions shown; findings below may reference images not displayed]

FINDINGS: CT HEAD

Brain: No evidence of acute large vascular territory infarction,
hemorrhage, hydrocephalus, extra-axial collection or mass
lesion/mass effect. Partially empty sella.

Vascular: See below.

Skull: No acute fracture. Visualized sinuses are largely clear. No
acute orbital findings.

Sinuses: Visualized sinuses are largely clear. No acute orbital
findings.

Other: No mastoid effusions.

CTA NECK

Aortic arch: Great vessel origins are patent.

Right carotid system: Moderate calcific and noncalcific
atherosclerosis at the carotid bifurcation without greater than 50%
narrowing.

Left carotid system: Mild atherosclerosis at the carotid bifurcation
without greater than 50% stenosis.

Vertebral arteries:Mild left dominant. Patent without greater than
50% stenosis.

Skeleton: Please see concurrent CT cervical spine for further
evaluation of the spine.

Other neck: Enlarged and heterogeneous thyroid gland. This has been
evaluated on previous imaging. (ref: [HOSPITAL]. [DATE]): 143-50).

CTA HEAD

Motion limited evaluation.

Anterior circulation: Calcific atherosclerosis of the right greater
than left cavernous and paraclinoid ICAs without evidence of greater
than 50% narrowing. No evidence of large vessel occlusion or
proximal flow limiting stenosis. Limited evaluation of the distal
MCAs and ACAs due to patient motion and venous timing.

Posterior circulation: Small vertebrobasilar system without evidence
of a large vessel occlusion are flow limiting proximal stenosis.
Left posterior communicating artery.

Venous sinuses: No evidence of dural venous sinus thrombosis.
IMPRESSION: CT head:

No evidence of acute large vascular territory infarct or acute
hemorrhage.

CTA head:

1. No large vessel occlusion or flow limiting proximal stenosis.
Limited evaluation distally due to patient motion and venous
contamination.
2. Small vertebrobasilar system.

CTA neck:

1. No evidence of significant (greater than 50%) stenosis.
2. Left greater than right carotid bifurcation atherosclerosis.
3. Please see concurrent CT of the cervical spine for evaluation of
the cervical spine.

## 2020-11-20 IMAGING — US US CAROTID DUPLEX BILAT
1 series · 12 of 24 positions shown · non-contrast
Comparison: Carotid Doppler ultrasound - [DATE]; thyroid
ultrasound - [DATE]

CLINICAL DATA: CVA. Altered mental status. History of CAD,
hypertension and hyperlipidemia.

EXAM:
BILATERAL CAROTID DUPLEX ULTRASOUND
TECHNIQUE: Gray scale imaging, color Doppler and duplex ultrasound were
performed of bilateral carotid and vertebral arteries in the neck.

[Series 1: us carotid bilateral · 12 of 75 slices shown]
[im 4/75]
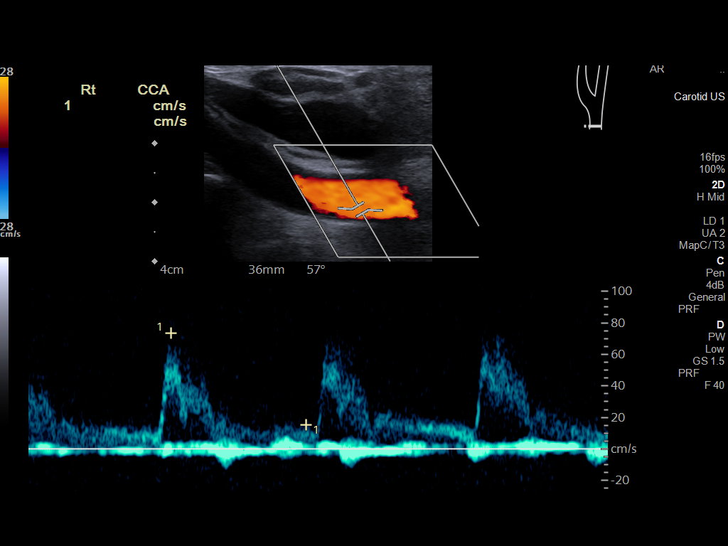
[im 10/75]
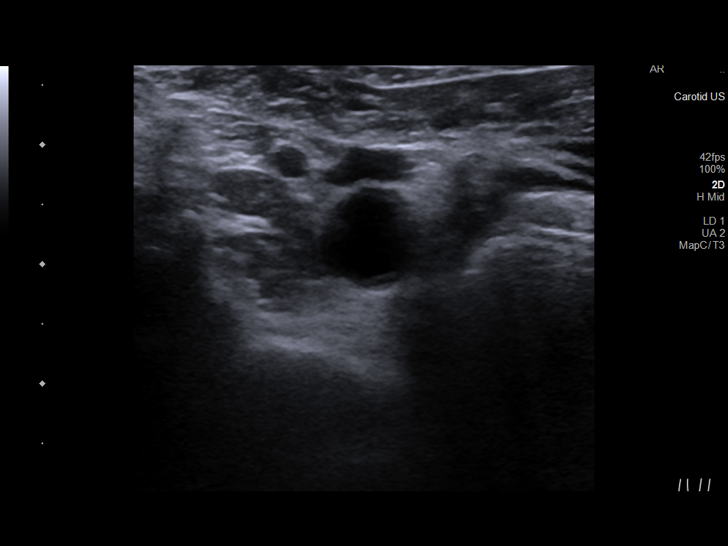
[im 17/75]
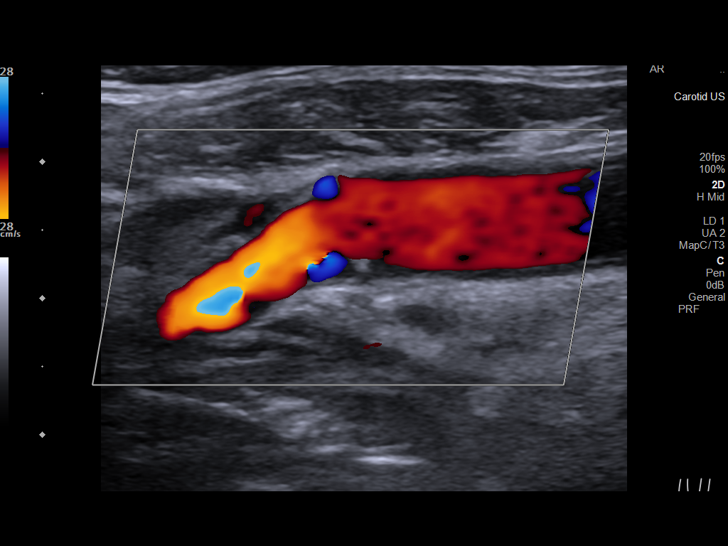
[im 23/75]
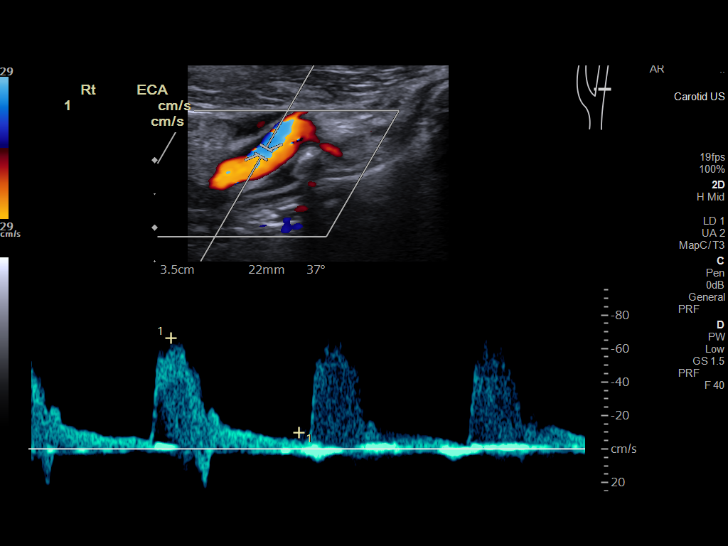
[im 29/75]
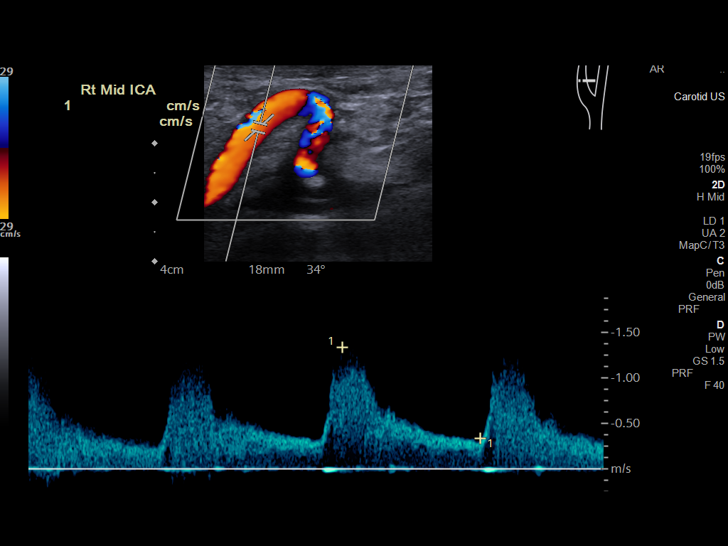
[im 36/75]
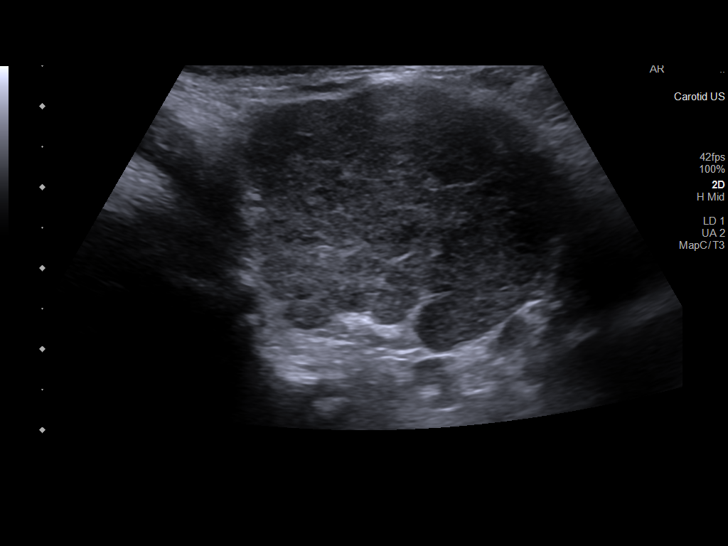
[im 42/75]
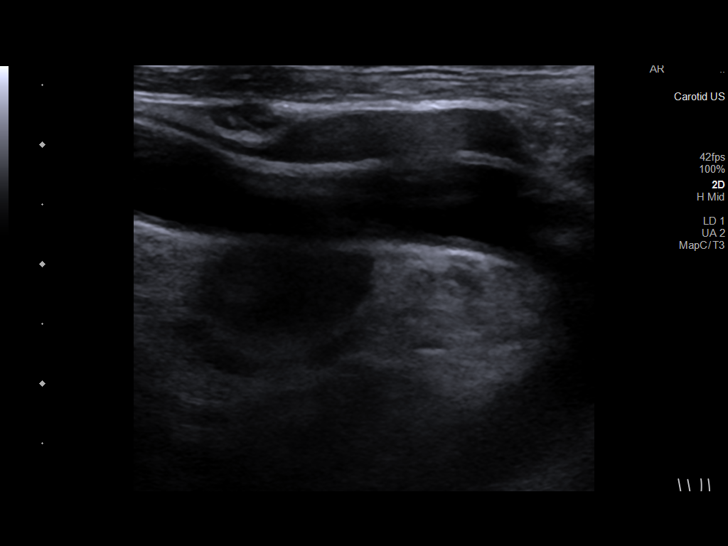
[im 49/75]
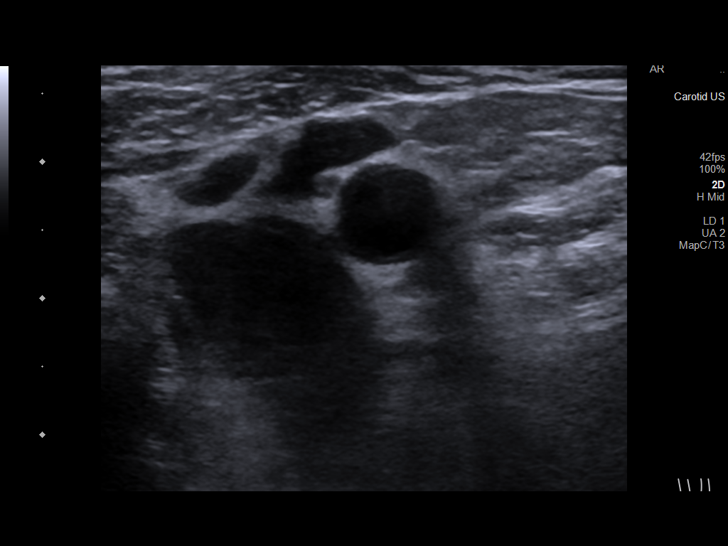
[im 55/75]
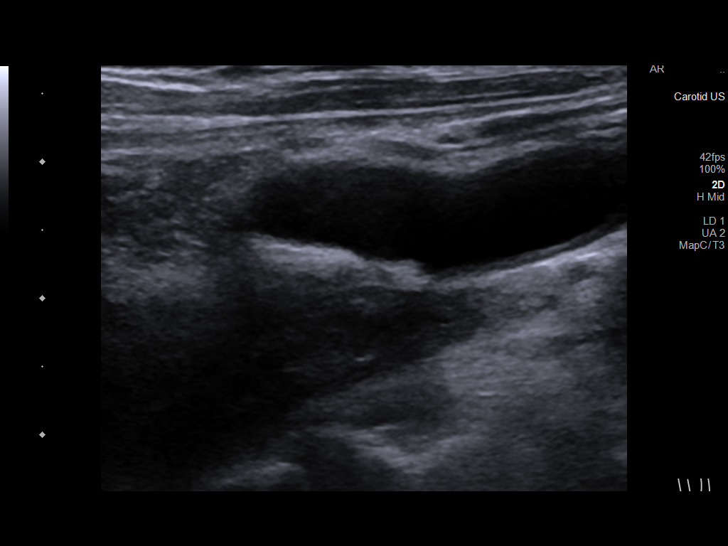
[im 62/75]
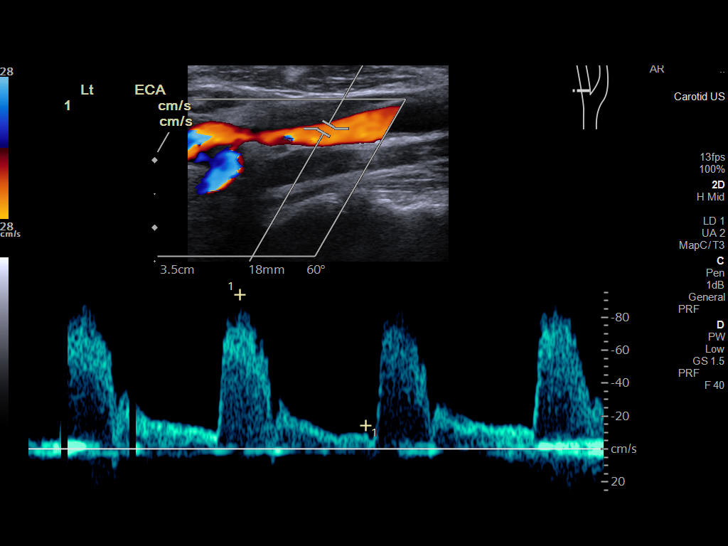
[im 68/75]
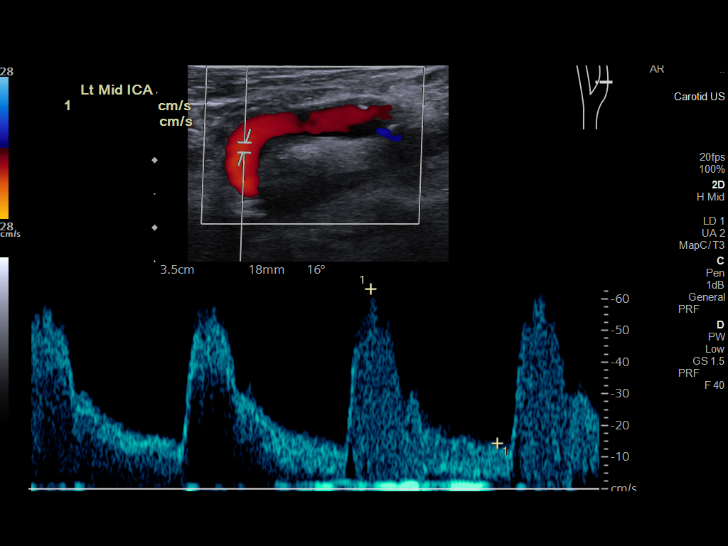
[im 75/75]
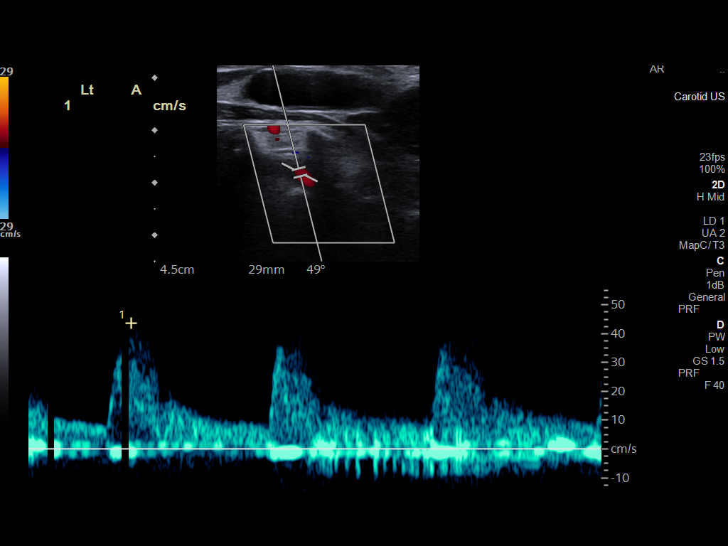

[12 of 24 positions shown; findings below may reference images not displayed]

FINDINGS: Criteria: Quantification of carotid stenosis is based on velocity
parameters that correlate the residual internal carotid diameter
with NASCET-based stenosis levels, using the diameter of the distal
internal carotid lumen as the denominator for stenosis measurement.

The following velocity measurements were obtained:

RIGHT

ICA: 146/40 cm/sec

CCA: 86/20 cm/sec

SYSTOLIC ICA/CCA RATIO:

ECA: 66 cm/sec

LEFT

ICA: 102/29 cm/sec

CCA: 92/19 cm/sec

SYSTOLIC ICA/CCA RATIO:

ECA: 94 cm/sec

RIGHT CAROTID ARTERY: There is a moderate amount of eccentric
echogenic plaque within the right carotid bulb (image 15 and 17),
extending to involve the origin and proximal aspects the right
internal carotid artery (image 25), morphologically similar to the
[DATE] examination resulting in mildly elevated peak systolic
velocities within the mid and distal aspects of the right internal
carotid artery though velocity measurements may be artifactually
elevated due to vessel tortuosity (representative image 29).
Greatest acquired peak systolic velocity within distal aspect the
right internal carotid artery measures 146 centimeters/second (image
33), previously, greatest acquired peak systolic velocity within mid
right ICA measured 171 centimeters/second.

RIGHT VERTEBRAL ARTERY:  Antegrade Flow

LEFT CAROTID ARTERY: There is a minimal amount of eccentric
echogenic plaque involving the origin and proximal aspects of the
left internal carotid artery (image 64), similar to the [DATE]
examination and again not resulting in elevated peak systolic
velocities within the interrogated course the left internal carotid
artery to suggest a hemodynamically significant stenosis. Mildly
elevated peak systolic velocity within distal aspect the left
internal carotid artery is felt to be factitious slightly elevated
due to sampling at a location of turbulent flow at the periphery of
the vessel.

LEFT VERTEBRAL ARTERY:  Antegrade flow

Marked heterogeneity of the incidentally imaged thyroid (images 37
through 41).
IMPRESSION: 1. Moderate amount of right-sided atherosclerotic plaque, similar to
the [DATE] examination, again resulting in elevated peak systolic
velocities within the right internal carotid artery compatible with
the 50-69% luminal narrowing range, though velocity measurements may
be artifactually elevated due to vessel tortuosity. Further
evaluation with CTA could be performed as clinically indicated.
2. Minimal amount of left-sided atherosclerotic plaque, similar to
the [DATE] examination and again not resulting in a hemodynamically
significant stenosis.
3. Marked heterogeneity of the incidentally imaged thyroid, similar
to dedicated thyroid ultrasound performed [DATE], nonspecific
though again favored to represent a thyroiditis.

## 2020-11-20 MED ORDER — FERROUS SULFATE 325 (65 FE) MG PO TABS
325.0000 mg | ORAL_TABLET | Freq: Two times a day (BID) | ORAL | Status: DC
  Administered 2020-11-21 – 2020-11-22 (×3): 325 mg via ORAL
  Filled 2020-11-20 (×3): qty 1

## 2020-11-20 MED ORDER — ACETAMINOPHEN 160 MG/5ML PO SOLN: 650.0000 mg | ORAL | Status: DC | PRN

## 2020-11-20 MED ORDER — PANTOPRAZOLE SODIUM 40 MG PO TBEC
40.0000 mg | DELAYED_RELEASE_TABLET | Freq: Every morning | ORAL | Status: DC
  Administered 2020-11-21 – 2020-11-22 (×2): 40 mg via ORAL
  Filled 2020-11-20 (×2): qty 1

## 2020-11-20 MED ORDER — ESCITALOPRAM OXALATE 10 MG PO TABS
5.0000 mg | ORAL_TABLET | Freq: Every day | ORAL | Status: DC
  Administered 2020-11-21: 5 mg via ORAL
  Filled 2020-11-20: qty 1

## 2020-11-20 MED ORDER — FOLIC ACID 1 MG PO TABS
1.0000 mg | ORAL_TABLET | Freq: Every morning | ORAL | Status: DC
  Administered 2020-11-21 – 2020-11-22 (×2): 1 mg via ORAL
  Filled 2020-11-20 (×2): qty 1

## 2020-11-20 MED ORDER — LABETALOL HCL 5 MG/ML IV SOLN: 10.0000 mg | INTRAVENOUS | Status: DC | PRN

## 2020-11-20 MED ORDER — ASPIRIN 325 MG PO TABS
325.0000 mg | ORAL_TABLET | Freq: Every day | ORAL | Status: DC
  Administered 2020-11-21: 325 mg via ORAL
  Filled 2020-11-20: qty 1

## 2020-11-20 MED ORDER — VITAMIN B-12 1000 MCG PO TABS
1000.0000 ug | ORAL_TABLET | Freq: Every morning | ORAL | Status: DC
  Administered 2020-11-21 – 2020-11-22 (×2): 1000 ug via ORAL
  Filled 2020-11-20 (×2): qty 1

## 2020-11-20 MED ORDER — ACETAMINOPHEN 650 MG RE SUPP: 650.0000 mg | RECTAL | Status: DC | PRN

## 2020-11-20 MED ORDER — LEVOTHYROXINE SODIUM 25 MCG PO TABS
25.0000 ug | ORAL_TABLET | Freq: Every morning | ORAL | Status: DC
  Administered 2020-11-22: 25 ug via ORAL
  Filled 2020-11-20: qty 1

## 2020-11-20 MED ORDER — ACETAMINOPHEN 325 MG PO TABS: 650.0000 mg | ORAL_TABLET | ORAL | Status: DC | PRN

## 2020-11-20 MED ORDER — IOHEXOL 350 MG/ML SOLN
50.0000 mL | Freq: Once | INTRAVENOUS | Status: AC | PRN
  Administered 2020-11-20: 50 mL via INTRAVENOUS

## 2020-11-20 MED ORDER — STROKE: EARLY STAGES OF RECOVERY BOOK
Freq: Once | Status: DC
  Filled 2020-11-20: qty 1

## 2020-11-20 MED ORDER — ROSUVASTATIN CALCIUM 10 MG PO TABS
5.0000 mg | ORAL_TABLET | Freq: Every day | ORAL | Status: DC
  Administered 2020-11-21: 5 mg via ORAL
  Filled 2020-11-20: qty 1

## 2020-11-20 MED ORDER — ENOXAPARIN SODIUM 40 MG/0.4ML IJ SOSY
40.0000 mg | PREFILLED_SYRINGE | INTRAMUSCULAR | Status: DC
  Filled 2020-11-20: qty 0.4

## 2020-11-20 MED ORDER — SODIUM CHLORIDE 0.9 % IV SOLN: INTRAVENOUS | Status: AC

## 2020-11-20 NOTE — ED Notes (Signed)
MD at bedside for MSE

## 2020-11-20 NOTE — ED Triage Notes (Signed)
Pt arrived POV. Family states pt is altered. LKW was 8PM last night

## 2020-11-20 NOTE — H&P (Signed)
History and Physical    Denise Page KYH:[id number] DOB: August 14, 1940 DOA: 11/20/2020  PCP: Rosalee Kaufman, PA-C   Patient coming from: Home via EMS  Chief Complaint: "Found down"  HPI: Denise Page is a 80 y.o. female with medical history significant for anemia, aortic stenosis, severe mitral regurgitation, hypothyroidism, dyslipidemia, and hypertension who was brought to the ED via EMS after her son found her down on the floor in her bedroom at approximately 1030 this morning.  She was more or less unresponsive and noted to be pale and appeared to have a left-sided facial droop.  She was last seen and known well at approximately 8 PM last night.  According to the daughter, she normally wakes up earlier in the morning, but the son did not see her come out of her room which is what alarmed him.  She was also noted to be incontinent of urine.  No other signs of trauma were otherwise noted.  Of note, patient has had significant weakness since October of last year according to the daughter, but refuses to use cane or walker.  She has also been noted to have ongoing upper respiratory symptoms with no diagnosis in the last several months after having seen 5 different providers.  She appears to have recently had TEE on 6/23 with plans for valve replacement soon per cardiology.   ED Course: Stable vital signs noted and patient noted to be somnolent, but arousable and communicates and answers questions appropriately.  CT of the head and neck as well as CTA with no acute findings noted.  TSH 3.5, hemoglobin 11.6, and ammonia 26.  CK level 361.  Urine analysis negative for any signs of infection and toxicology negative for any acute findings.  EDP has discussed with Dr. Cheral Marker who feels that this could be related to stroke or seizure.  She continues to have some ongoing facial drooping.  Review of Systems: Reviewed as noted above, otherwise negative.  Past Medical History:  Diagnosis Date    Anemia    Aortic stenosis    Bilateral carotid artery disease ([place])    Enlarged thyroid    Hyperlipidemia    Hypertension    NSTEMI (non-ST elevated myocardial infarction) [facility])     Past Surgical History:  Procedure Laterality Date   BIOPSY  08/01/2020   Procedure: BIOPSY;  Surgeon: Ronnette Juniper, MD;  Location: [facility] ENDOSCOPY;  Service: Gastroenterology;;  EGD and COLON   CHOLECYSTECTOMY     COLONOSCOPY N/A 08/01/2020   Procedure: COLONOSCOPY;  Surgeon: Ronnette Juniper, MD;  Location: [facility] ENDOSCOPY;  Service: Gastroenterology;  Laterality: N/A;   CYSTECTOMY     ESOPHAGOGASTRODUODENOSCOPY (EGD) WITH PROPOFOL N/A 08/01/2020   Procedure: ESOPHAGOGASTRODUODENOSCOPY (EGD) WITH PROPOFOL;  Surgeon: Ronnette Juniper, MD;  Location: [facility] ENDOSCOPY;  Service: Gastroenterology;  Laterality: N/A;   POLYPECTOMY  08/01/2020   Procedure: POLYPECTOMY;  Surgeon: Ronnette Juniper, MD;  Location: [facility] ENDOSCOPY;  Service: Gastroenterology;;   TEE WITHOUT CARDIOVERSION N/A 11/13/2020   Procedure: TRANSESOPHAGEAL ECHOCARDIOGRAM (TEE);  Surgeon: Josue Hector, MD;  Location: [facility] ENDOSCOPY;  Service: Cardiovascular;  Laterality: N/A;   TONSILLECTOMY       reports that she has never smoked. She has never used smokeless tobacco. No history on file for alcohol use and drug use.  Allergies  Allergen Reactions   Morphine And Related Anaphylaxis   Sulfa Antibiotics Anaphylaxis   Codeine Nausea And Vomiting   Tramadol Nausea And Vomiting    Family History  Problem Relation Age of  Onset   Throat cancer Sister    Prostate cancer Brother     Prior to Admission medications   Medication Sig Start Date End Date Taking? Authorizing Provider  acetaminophen (TYLENOL) 650 MG CR tablet Take 650 mg by mouth every 8 (eight) hours as needed for pain.   Yes [provider]  ALPRAZolam Duanne Moron) 0.5 MG tablet Take 0.5 mg by mouth 2 (two) times daily.   Yes [provider]  aspirin EC 81 MG tablet Take 81 mg by mouth in the  morning. Swallow whole.   Yes [provider]  calcitonin, salmon, (MIACALCIN/FORTICAL) 200 UNIT/ACT nasal spray Place 1 spray into alternate nostrils daily as needed (pain). 06/11/20  Yes [provider]  carvedilol (COREG) 6.25 MG tablet TAKE 1 TABLET BY MOUTH TWICE DAILY with a meal Patient taking differently: Take 6.25 mg by mouth 2 (two) times daily with a meal. 10/23/20  Yes Minus Breeding, MD  escitalopram (LEXAPRO) 5 MG tablet Take 5 mg by mouth at bedtime.   Yes [provider]  ferrous sulfate 325 (65 FE) MG tablet Take 325 mg by mouth in the morning and at bedtime.   Yes [provider]  folic acid (FOLVITE) 1 MG tablet Take 1 mg by mouth in the morning.   Yes [provider]  levothyroxine (SYNTHROID) 25 MCG tablet Take 25 mcg by mouth every morning. 10/06/20  Yes [provider]  lisinopril (ZESTRIL) 5 MG tablet TAKE 1 TABLET BY MOUTH EVERY DAY Patient taking differently: Take 5 mg by mouth daily. 10/17/20  Yes Minus Breeding, MD  pantoprazole (PROTONIX) 40 MG tablet Take 40 mg by mouth in the morning.   Yes [provider]  rosuvastatin (CRESTOR) 5 MG tablet TAKE 1 TABLET BY MOUTH EVERY DAY Patient taking differently: Take 5 mg by mouth daily. 10/17/20  Yes Minus Breeding, MD  sucralfate (CARAFATE) 1 g tablet Take 1 g by mouth 2 (two) times daily.   Yes [provider]  vitamin B-12 (CYANOCOBALAMIN) 1000 MCG tablet Take 1,000 mcg by mouth in the morning.   Yes [provider]    Physical Exam: Vitals:   11/20/20 1200 11/20/20 1207 11/20/20 1230 11/20/20 1341  BP: 138/70  (!) 147/72 123/69  Pulse: 67  74 72  Resp: 20  18 20   Temp:  98.1 F (36.7 C)    TempSrc:  Oral    SpO2: 99%  100% 92%  Weight:      Height:        Constitutional: NAD, calm, comfortable, elderly female Vitals:   11/20/20 1200 11/20/20 1207 11/20/20 1230 11/20/20 1341  BP: 138/70  (!) 147/72 123/69  Pulse: 67  74 72  Resp:  20  18 20   Temp:  98.1 F (36.7 C)    TempSrc:  Oral    SpO2: 99%  100% 92%  Weight:      Height:       Eyes: lids and conjunctivae normal Neck: normal, supple Respiratory: clear to auscultation bilaterally. Normal respiratory effort. No accessory muscle use.  Cardiovascular: Regular rate and rhythm, no murmurs. Abdomen: no tenderness, no distention. Bowel sounds positive.  Musculoskeletal:  No edema. Skin: no rashes, lesions, ulcers.  Psychiatric: Flat affect Neurological: Symmetric motor strength noted to bilateral upper and lower extremities and sensation appears to be intact.  Inability to smile with some facial muscle asymmetry noted.  Labs on Admission: I have personally reviewed following labs and imaging studies  CBC: Recent  Labs  Lab 11/20/20 1141 11/20/20 1147  WBC 4.6  --   NEUTROABS 2.7  --   HGB 11.3* 11.6*  HCT 34.3* 34.0*  MCV 92.0  --   PLT 133*  --    Basic Metabolic Panel: Recent Labs  Lab 11/20/20 1141 11/20/20 1147  NA 134* 138  K 4.1 4.3  CL 101 101  CO2 28  --   GLUCOSE 118* 113*  BUN 19 18  CREATININE 1.08* 1.10*  CALCIUM 8.5*  --    GFR: Estimated Creatinine Clearance: 45.4 mL/min (A) (by C-G formula based on SCr of 1.1 mg/dL (H)). Liver Function Tests: Recent Labs  Lab 11/20/20 1141  AST 22  ALT 13  ALKPHOS 48  BILITOT 0.6  PROT 6.8  ALBUMIN 4.2   No results for input(s): LIPASE, AMYLASE in the last 168 hours. Recent Labs  Lab 11/20/20 1157  AMMONIA 26   Coagulation Profile: Recent Labs  Lab 11/20/20 1141  INR 1.2   Cardiac Enzymes: Recent Labs  Lab 11/20/20 1141  CKTOTAL 361*   BNP (last 3 results) No results for input(s): PROBNP in the last 8760 hours. HbA1C: No results for input(s): HGBA1C in the last 72 hours. CBG: No results for input(s): GLUCAP in the last 168 hours. Lipid Profile: No results for input(s): CHOL, HDL, LDLCALC, TRIG, CHOLHDL, LDLDIRECT in the last 72 hours. Thyroid Function  Tests: Recent Labs    11/20/20 1141  TSH 3.533   Anemia Panel: No results for input(s): VITAMINB12, FOLATE, FERRITIN, TIBC, IRON, RETICCTPCT in the last 72 hours. Urine analysis:    Component Value Date/Time   COLORURINE YELLOW 11/20/2020 1141   APPEARANCEUR CLEAR 11/20/2020 1141   LABSPEC 1.015 11/20/2020 1141   PHURINE 5.0 11/20/2020 1141   GLUCOSEU NEGATIVE 11/20/2020 1141   HGBUR SMALL (A) 11/20/2020 1141   BILIRUBINUR NEGATIVE 11/20/2020 1141   KETONESUR NEGATIVE 11/20/2020 1141   PROTEINUR NEGATIVE 11/20/2020 1141   NITRITE NEGATIVE 11/20/2020 1141   [place] 11/20/2020 1141    Radiological Exams on Admission: CT Angio Head W or Wo Contrast  Result Date: 11/20/2020 CLINICAL DATA:  Stroke/TIA.  Found down. EXAM: CT HEAD WITHOUT CONTRAST CT ANGIOGRAPHY OF THE HEAD AND NECK TECHNIQUE: Contiguous axial images were obtained from the base of the skull through the vertex without intravenous contrast. Multidetector CT imaging of the head and neck was performed using the standard protocol during bolus administration of intravenous contrast. Multiplanar CT image reconstructions and MIPs were obtained to evaluate the vascular anatomy. Carotid stenosis measurements (when applicable) are obtained utilizing NASCET criteria, using the distal internal carotid diameter as the denominator. CONTRAST:  80mL OMNIPAQUE IOHEXOL 350 MG/ML SOLN COMPARISON:  None. FINDINGS: CT HEAD Brain: No evidence of acute large vascular territory infarction, hemorrhage, hydrocephalus, extra-axial collection or mass lesion/mass effect. Partially empty sella. Vascular: See below. Skull: No acute fracture. Visualized sinuses are largely clear. No acute orbital findings. Sinuses: Visualized sinuses are largely clear. No acute orbital findings. Other: No mastoid effusions. CTA NECK Aortic arch: Great vessel origins are patent. Right carotid system: Moderate calcific and noncalcific atherosclerosis at the carotid  bifurcation without greater than 50% narrowing. Left carotid system: Mild atherosclerosis at the carotid bifurcation without greater than 50% stenosis. Vertebral arteries:Mild left dominant. Patent without greater than 50% stenosis. Skeleton: Please see concurrent CT cervical spine for further evaluation of the spine. Other neck: Enlarged and heterogeneous thyroid gland. This has been evaluated on previous imaging. (ref: J Am Coll Radiol. 2015  Feb;12(2): 143-50). CTA HEAD Motion limited evaluation. Anterior circulation: Calcific atherosclerosis of the right greater than left cavernous and paraclinoid ICAs without evidence of greater than 50% narrowing. No evidence of large vessel occlusion or proximal flow limiting stenosis. Limited evaluation of the distal MCAs and ACAs due to patient motion and venous timing. Posterior circulation: Small vertebrobasilar system without evidence of a large vessel occlusion are flow limiting proximal stenosis. Left posterior communicating artery. Venous sinuses: No evidence of dural venous sinus thrombosis. IMPRESSION: CT head: No evidence of acute large vascular territory infarct or acute hemorrhage. CTA head: 1. No large vessel occlusion or flow limiting proximal stenosis. Limited evaluation distally due to patient motion and venous contamination. 2. Small vertebrobasilar system. CTA neck: 1. No evidence of significant (greater than 50%) stenosis. 2. Left greater than right carotid bifurcation atherosclerosis. 3. Please see concurrent CT of the cervical spine for evaluation of the cervical spine. Electronically Signed   By: Margaretha Sheffield MD   On: 11/20/2020 13:18   DG Chest 2 View  Result Date: 11/20/2020 CLINICAL DATA:  Fall. EXAM: CHEST - 2 VIEW COMPARISON:  March 19, 2020. FINDINGS: Stable cardiomegaly. No pneumothorax or pleural effusion is noted. Mild central pulmonary vascular may be present with possible bibasilar atelectasis or edema. No acute bony abnormality is  noted. IMPRESSION: Stable cardiomegaly with mild central pulmonary vascular congestion. Possible bibasilar subsegmental atelectasis or edema is noted. Electronically Signed   By: Marijo Conception M.D.   On: 11/20/2020 13:43   DG Pelvis 1-2 Views  Result Date: 11/20/2020 CLINICAL DATA:  Status post fall. EXAM: PELVIS - 1-2 VIEW COMPARISON:  None. FINDINGS: There is no evidence of pelvic fracture or diastasis. No pelvic bone lesions are seen. Contrast in the ureters and bladder from the patient's CT angiogram of the head and neck today noted. IMPRESSION: No acute abnormality. Electronically Signed   By: Inge Rise M.D.   On: 11/20/2020 13:44   CT Angio Neck W and/or Wo Contrast  Result Date: 11/20/2020 CLINICAL DATA:  Stroke/TIA.  Found down. EXAM: CT HEAD WITHOUT CONTRAST CT ANGIOGRAPHY OF THE HEAD AND NECK TECHNIQUE: Contiguous axial images were obtained from the base of the skull through the vertex without intravenous contrast. Multidetector CT imaging of the head and neck was performed using the standard protocol during bolus administration of intravenous contrast. Multiplanar CT image reconstructions and MIPs were obtained to evaluate the vascular anatomy. Carotid stenosis measurements (when applicable) are obtained utilizing NASCET criteria, using the distal internal carotid diameter as the denominator. CONTRAST:  30mL OMNIPAQUE IOHEXOL 350 MG/ML SOLN COMPARISON:  None. FINDINGS: CT HEAD Brain: No evidence of acute large vascular territory infarction, hemorrhage, hydrocephalus, extra-axial collection or mass lesion/mass effect. Partially empty sella. Vascular: See below. Skull: No acute fracture. Visualized sinuses are largely clear. No acute orbital findings. Sinuses: Visualized sinuses are largely clear. No acute orbital findings. Other: No mastoid effusions. CTA NECK Aortic arch: Great vessel origins are patent. Right carotid system: Moderate calcific and noncalcific atherosclerosis at the  carotid bifurcation without greater than 50% narrowing. Left carotid system: Mild atherosclerosis at the carotid bifurcation without greater than 50% stenosis. Vertebral arteries:Mild left dominant. Patent without greater than 50% stenosis. Skeleton: Please see concurrent CT cervical spine for further evaluation of the spine. Other neck: Enlarged and heterogeneous thyroid gland. This has been evaluated on previous imaging. (ref: J Am Coll Radiol. 2015 Feb;12(2): 143-50). CTA HEAD Motion limited evaluation. Anterior circulation: Calcific atherosclerosis of the right greater than  left cavernous and paraclinoid ICAs without evidence of greater than 50% narrowing. No evidence of large vessel occlusion or proximal flow limiting stenosis. Limited evaluation of the distal MCAs and ACAs due to patient motion and venous timing. Posterior circulation: Small vertebrobasilar system without evidence of a large vessel occlusion are flow limiting proximal stenosis. Left posterior communicating artery. Venous sinuses: No evidence of dural venous sinus thrombosis. IMPRESSION: CT head: No evidence of acute large vascular territory infarct or acute hemorrhage. CTA head: 1. No large vessel occlusion or flow limiting proximal stenosis. Limited evaluation distally due to patient motion and venous contamination. 2. Small vertebrobasilar system. CTA neck: 1. No evidence of significant (greater than 50%) stenosis. 2. Left greater than right carotid bifurcation atherosclerosis. 3. Please see concurrent CT of the cervical spine for evaluation of the cervical spine. Electronically Signed   By: Margaretha Sheffield MD   On: 11/20/2020 13:18   CT C-SPINE NO CHARGE  Result Date: 11/20/2020 CLINICAL DATA:  Found on floor.  Probable fall. EXAM: CT CERVICAL SPINE WITHOUT CONTRAST TECHNIQUE: Multidetector CT imaging of the cervical spine was performed without intravenous contrast. Multiplanar CT image reconstructions were also generated. COMPARISON:   CT angio head and neck 11/20/2020 FINDINGS: Alignment: Mild anterolisthesis C5-6 Skull base and vertebrae: Negative for fracture or mass lesion. Soft tissues and spinal canal: Mild thyromegaly. No soft tissue mass in the neck. Disc levels: Mild disc degeneration and spurring most prominent C5-6 and C6-7. No significant spinal stenosis. Upper chest: Lung apices clear bilaterally. Other: None IMPRESSION: Negative for cervical fracture. Electronically Signed   By: Franchot Gallo M.D.   On: 11/20/2020 13:20   DG Knee Complete 4 Views Right  Result Date: 11/20/2020 CLINICAL DATA:  Right knee pain after fall. EXAM: RIGHT KNEE - COMPLETE 4+ VIEW COMPARISON:  None. FINDINGS: No evidence of fracture, dislocation, or joint effusion. Moderate to severe narrowing of medial joint space is noted. Soft tissues are unremarkable. 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 No acute abnormality seen. Electronically Signed   By: Marijo Conception M.D.   On: 11/20/2020 13:44    EKG: Independently reviewed. SR 72bpm.  Assessment/Plan Active Problems:   Acute encephalopathy    Acute encephalopathy suspect TIA/CVA versus seizure activity -Brain MRI pending -Avoid 2D echocardiogram as patient had TEE on 6/23 with no thrombus noted -Lipid panel, hemoglobin A1c -SLP, OT, PT evaluation -Fall precautions -Maintain on full dose aspirin and statin for now -Continue monitoring on telemetry -Ammonia and TSH within normal limits -Urine analysis negative for UTI -Normal saline IV  History of hypertension -Hold home blood pressure medications and allow permissive hypertension -As needed labetalol as needed for severe elevations  History of dyslipidemia -Continue Crestor and check lipid panel as noted above  Hypothyroidism -Continue home Synthroid -TSH 3.5  Severe MR/moderate AAS -Per cardiology with need for valve replacement noted  Chronic anemia -History of prior upper GI  bleeds with need for transfusions -Continue monitoring repeat CBC -No sign of overt bleeding currently noted  DVT prophylaxis: Lovenox Code Status: Full Family Communication: Daughter, [name] at bedside Disposition Plan:Admit for TIA/CVA workup Consults called:EDP Discussed with Dr. Cheral Marker Admission status: Inpatient, Tele   Goldie Tregoning D Melanye Hiraldo DO [facility]  If 7PM-7AM, please contact night-coverage [email or web address]  11/20/2020, 2:48 PM

## 2020-11-20 NOTE — ED Notes (Signed)
Checked pt brief she is still dry and I put purewick back in place

## 2020-11-20 NOTE — ED Notes (Signed)
Got pt an extra pillow

## 2020-11-20 NOTE — Progress Notes (Signed)
Patient failed swallow study. Speech consult initiated.

## 2020-11-20 NOTE — ED Provider Notes (Signed)
Emergency Department Provider Note   I have reviewed the triage vital signs and the nursing notes.   HISTORY  Chief Complaint Altered Mental Status   HPI Denise Page is a 80 y.o. female with past medical history reviewed below presents to the emergency department after being found down at home.  The patient lives with her son.  She was last seen normal at 8 PM yesterday.  She typically has very mild confusion and requires minimal assistance.  She was up and cleaning her room.  According to family at bedside they went to check on her somewhere between 10 and 11 AM this morning and found her facedown on the ground.  She was very confused and could not stand on her own.  They were able to carry her into the car and she arrived with family by private vehicle.   Patient cannot tell me anything regarding the events last night.  She is confused.  She denies chest pain or shortness of breath.  Level 5 caveat does apply with confusion.   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   Pulmonary HTN ([place]) 04/22/2020   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    Past Surgical History:  Procedure Laterality Date   BIOPSY  08/01/2020   Procedure: BIOPSY;  Surgeon: Ronnette Juniper, MD;  Location: [facility] ENDOSCOPY;  Service: Gastroenterology;;  EGD and COLON   CHOLECYSTECTOMY     COLONOSCOPY N/A 08/01/2020   Procedure: COLONOSCOPY;  Surgeon: Ronnette Juniper, MD;  Location: [facility] ENDOSCOPY;  Service: Gastroenterology;  Laterality: N/A;   CYSTECTOMY     ESOPHAGOGASTRODUODENOSCOPY (EGD) WITH PROPOFOL N/A 08/01/2020   Procedure: ESOPHAGOGASTRODUODENOSCOPY (EGD) WITH PROPOFOL;  Surgeon: Ronnette Juniper,  MD;  Location: [facility]OS[facility];  Service: Gastroenterology;  Laterality: N/A;   POLYPECTOMY  08/01/2020   Procedure: POLYPECTOMY;  Surgeon: Ronnette Juniper, MD;  Location: [facility]OS[facility];  Service: Gastroenterology;;   TEE WITHOUT CARDIOVERSION N/A 11/13/2020   Procedure: TRANSESOPHAGEAL ECHOCARDIOGRAM (TEE);  Surgeon: Josue Hector, MD;  Location: [facility] ENDOSCOPY;  Service: Cardiovascular;  Laterality: N/A;   TONSILLECTOMY      Allergies Morphine and related, Sulfa antibiotics, Codeine, and Tramadol  Family History  Problem Relation Age of Onset   Throat cancer Sister    Prostate cancer Brother     Social History Social History   Tobacco Use   Smoking status: Never   Smokeless tobacco: Never    Review of Systems  Constitutional: No fever/chills Eyes: No visual changes. ENT: No sore throat. Cardiovascular: Denies chest pain. Respiratory: Denies shortness of breath. Gastrointestinal: No abdominal pain.  No nausea, no vomiting.  No diarrhea.  No constipation. Genitourinary: Negative for dysuria. Musculoskeletal: Negative for back pain. Skin: Negative for rash. Neurological: Negative for headaches, focal weakness or numbness.  10-point ROS otherwise negative.  ____________________________________________   PHYSICAL EXAM:  VITAL SIGNS: Vitals:   11/20/20 1341 11/20/20 1518  BP: 123/69 (!) 159/77  Pulse: 72 79  Resp: 20 (!) 23  Temp:    SpO2: 92% 97%    Constitutional: Alert but oriented to self only.  Eyes: Conjunctivae are normal. PERRL. Head: Atraumatic. Nose: No congestion/rhinnorhea. Mouth/Throat:  Mucous membranes are moist.  Neck: No stridor.  Cardiovascular: Normal rate, regular rhythm. Good peripheral circulation. Grossly normal heart sounds.   Respiratory: Normal respiratory effort.  No retractions. Lungs CTAB. Gastrointestinal: Soft and nontender. No distention.  Musculoskeletal: No lower extremity tenderness nor edema. No gross deformities of  extremities. Neurologic:  Normal speech and language but notably confused. Slight flattening of right nasolabial fold. Equal strength in the bilateral upper and lower extremities. No unilateral numbness.  Skin:  Skin is warm, dry and intact. No rash noted.   ____________________________________________   LABS (all labs ordered are listed, but only abnormal results are displayed)  Labs Reviewed  CBC - Abnormal; Notable for the following components:      Result Value   RBC 3.73 (*)    Hemoglobin 11.3 (*)    HCT 34.3 (*)    Platelets 133 (*)    All other components within normal limits  DIFFERENTIAL - Abnormal; Notable for the following components:   Abs Immature Granulocytes 0.08 (*)    All other components within normal limits  COMPREHENSIVE METABOLIC PANEL - Abnormal; Notable for the following components:   Sodium 134 (*)    Glucose, Bld 118 (*)    Creatinine, Ser 1.08 (*)    Calcium 8.5 (*)    GFR, Estimated 52 (*)    All other components within normal limits  URINALYSIS, ROUTINE W REFLEX MICROSCOPIC - Abnormal; Notable for the following components:   Hgb urine dipstick SMALL (*)    All other components within normal limits  CK - Abnormal; Notable for the following components:   Total CK 361 (*)    All other components within normal limits  I-STAT CHEM 8, ED - Abnormal; Notable for the following components:   Creatinine, Ser 1.10 (*)    Glucose, Bld 113 (*)    Hemoglobin 11.6 (*)    HCT 34.0 (*)    All other components within normal limits  RESP PANEL BY RT-PCR (FLU A&B, COVID) ARPGX2  ETHANOL  PROTIME-INR  APTT  RAPID URINE DRUG SCREEN, HOSP PERFORMED  AMMONIA  TSH  CBC  CREATININE, SERUM  TROPONIN I (HIGH SENSITIVITY)  TROPONIN I (HIGH SENSITIVITY)   ____________________________________________  EKG   EKG Interpretation  Date/Time:  Thursday November 20 2020 11:54:31 EDT Ventricular Rate:  72 PR Interval:  175 QRS Duration: 95 QT Interval:  387 QTC  Calculation: 424 R Axis:   46 Text Interpretation: Sinus rhythm Borderline repolarization abnormality No old tracing in MUSE for comparison Confirmed by Nanda [name] [phone]) on 11/20/2020 12:11:20 PM         ____________________________________________  RADIOLOGY  CT Angio Head W or Wo Contrast  Result Date: 11/20/2020 CLINICAL DATA:  Stroke/TIA.  Found down. EXAM: CT HEAD WITHOUT CONTRAST CT ANGIOGRAPHY OF THE HEAD AND NECK TECHNIQUE: Contiguous axial images were obtained from the base of the skull through the vertex without intravenous contrast. Multidetector CT imaging of the head and neck was performed using the standard protocol during bolus administration of intravenous contrast. Multiplanar CT image reconstructions and MIPs were obtained to evaluate the vascular anatomy. Carotid stenosis measurements (when applicable) are obtained utilizing NASCET criteria, using the distal internal carotid diameter as the denominator. CONTRAST:  86mL OMNIPAQUE IOHEXOL 350 MG/ML SOLN COMPARISON:  None. FINDINGS: CT HEAD Brain: No evidence of acute large vascular territory infarction, hemorrhage, hydrocephalus, extra-axial collection or mass lesion/mass effect. Partially empty sella. Vascular: See below. Skull: No acute fracture. Visualized sinuses are largely clear. No  acute orbital findings. Sinuses: Visualized sinuses are largely clear. No acute orbital findings. Other: No mastoid effusions. CTA NECK Aortic arch: Great vessel origins are patent. Right carotid system: Moderate calcific and noncalcific atherosclerosis at the carotid bifurcation without greater than 50% narrowing. Left carotid system: Mild atherosclerosis at the carotid bifurcation without greater than 50% stenosis. Vertebral arteries:Mild left dominant. Patent without greater than 50% stenosis. Skeleton: Please see concurrent CT cervical spine for further evaluation of the spine. Other neck: Enlarged and heterogeneous thyroid gland. This has  been evaluated on previous imaging. (ref: J Am Coll Radiol. 2015 Feb;12(2): 143-50). CTA HEAD Motion limited evaluation. Anterior circulation: Calcific atherosclerosis of the right greater than left cavernous and paraclinoid ICAs without evidence of greater than 50% narrowing. No evidence of large vessel occlusion or proximal flow limiting stenosis. Limited evaluation of the distal MCAs and ACAs due to patient motion and venous timing. Posterior circulation: Small vertebrobasilar system without evidence of a large vessel occlusion are flow limiting proximal stenosis. Left posterior communicating artery. Venous sinuses: No evidence of dural venous sinus thrombosis. IMPRESSION: CT head: No evidence of acute large vascular territory infarct or acute hemorrhage. CTA head: 1. No large vessel occlusion or flow limiting proximal stenosis. Limited evaluation distally due to patient motion and venous contamination. 2. Small vertebrobasilar system. CTA neck: 1. No evidence of significant (greater than 50%) stenosis. 2. Left greater than right carotid bifurcation atherosclerosis. 3. Please see concurrent CT of the cervical spine for evaluation of the cervical spine. Electronically Signed   By: Margaretha Sheffield MD   On: 11/20/2020 13:18   DG Chest 2 View  Result Date: 11/20/2020 CLINICAL DATA:  Fall. EXAM: CHEST - 2 VIEW COMPARISON:  March 19, 2020. FINDINGS: Stable cardiomegaly. No pneumothorax or pleural effusion is noted. Mild central pulmonary vascular may be present with possible bibasilar atelectasis or edema. No acute bony abnormality is noted. IMPRESSION: Stable cardiomegaly with mild central pulmonary vascular congestion. Possible bibasilar subsegmental atelectasis or edema is noted. Electronically Signed   By: Marijo Conception M.D.   On: 11/20/2020 13:43   DG Pelvis 1-2 Views  Result Date: 11/20/2020 CLINICAL DATA:  Status post fall. EXAM: PELVIS - 1-2 VIEW COMPARISON:  None. FINDINGS: There is no evidence  of pelvic fracture or diastasis. No pelvic bone lesions are seen. Contrast in the ureters and bladder from the patient's CT angiogram of the head and neck today noted. IMPRESSION: No acute abnormality. Electronically Signed   By: Inge Rise M.D.   On: 11/20/2020 13:44   CT Angio Neck W and/or Wo Contrast  Result Date: 11/20/2020 CLINICAL DATA:  Stroke/TIA.  Found down. EXAM: CT HEAD WITHOUT CONTRAST CT ANGIOGRAPHY OF THE HEAD AND NECK TECHNIQUE: Contiguous axial images were obtained from the base of the skull through the vertex without intravenous contrast. Multidetector CT imaging of the head and neck was performed using the standard protocol during bolus administration of intravenous contrast. Multiplanar CT image reconstructions and MIPs were obtained to evaluate the vascular anatomy. Carotid stenosis measurements (when applicable) are obtained utilizing NASCET criteria, using the distal internal carotid diameter as the denominator. CONTRAST:  14mL OMNIPAQUE IOHEXOL 350 MG/ML SOLN COMPARISON:  None. FINDINGS: CT HEAD Brain: No evidence of acute large vascular territory infarction, hemorrhage, hydrocephalus, extra-axial collection or mass lesion/mass effect. Partially empty sella. Vascular: See below. Skull: No acute fracture. Visualized sinuses are largely clear. No acute orbital findings. Sinuses: Visualized sinuses are largely clear. No acute orbital findings. Other: No mastoid  effusions. CTA NECK Aortic arch: Great vessel origins are patent. Right carotid system: Moderate calcific and noncalcific atherosclerosis at the carotid bifurcation without greater than 50% narrowing. Left carotid system: Mild atherosclerosis at the carotid bifurcation without greater than 50% stenosis. Vertebral arteries:Mild left dominant. Patent without greater than 50% stenosis. Skeleton: Please see concurrent CT cervical spine for further evaluation of the spine. Other neck: Enlarged and heterogeneous thyroid gland. This  has been evaluated on previous imaging. (ref: J Am Coll Radiol. 2015 Feb;12(2): 143-50). CTA HEAD Motion limited evaluation. Anterior circulation: Calcific atherosclerosis of the right greater than left cavernous and paraclinoid ICAs without evidence of greater than 50% narrowing. No evidence of large vessel occlusion or proximal flow limiting stenosis. Limited evaluation of the distal MCAs and ACAs due to patient motion and venous timing. Posterior circulation: Small vertebrobasilar system without evidence of a large vessel occlusion are flow limiting proximal stenosis. Left posterior communicating artery. Venous sinuses: No evidence of dural venous sinus thrombosis. IMPRESSION: CT head: No evidence of acute large vascular territory infarct or acute hemorrhage. CTA head: 1. No large vessel occlusion or flow limiting proximal stenosis. Limited evaluation distally due to patient motion and venous contamination. 2. Small vertebrobasilar system. CTA neck: 1. No evidence of significant (greater than 50%) stenosis. 2. Left greater than right carotid bifurcation atherosclerosis. 3. Please see concurrent CT of the cervical spine for evaluation of the cervical spine. Electronically Signed   By: Margaretha Sheffield MD   On: 11/20/2020 13:18   CT C-SPINE NO CHARGE  Result Date: 11/20/2020 CLINICAL DATA:  Found on floor.  Probable fall. EXAM: CT CERVICAL SPINE WITHOUT CONTRAST TECHNIQUE: Multidetector CT imaging of the cervical spine was performed without intravenous contrast. Multiplanar CT image reconstructions were also generated. COMPARISON:  CT angio head and neck 11/20/2020 FINDINGS: Alignment: Mild anterolisthesis C5-6 Skull base and vertebrae: Negative for fracture or mass lesion. Soft tissues and spinal canal: Mild thyromegaly. No soft tissue mass in the neck. Disc levels: Mild disc degeneration and spurring most prominent C5-6 and C6-7. No significant spinal stenosis. Upper chest: Lung apices clear bilaterally.  Other: None IMPRESSION: Negative for cervical fracture. Electronically Signed   By: Franchot Gallo M.D.   On: 11/20/2020 13:20   DG Knee Complete 4 Views Right  Result Date: 11/20/2020 CLINICAL DATA:  Right knee pain after fall. EXAM: RIGHT KNEE - COMPLETE 4+ VIEW COMPARISON:  None. FINDINGS: No evidence of fracture, dislocation, or joint effusion. Moderate to severe narrowing of medial joint space is noted. Soft tissues are unremarkable. 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 No acute abnormality seen. Electronically Signed   By: Marijo Conception M.D.   On: 11/20/2020 13:44    ____________________________________________   PROCEDURES  Procedure(s) performed:   Procedures  CRITICAL CARE Performed by: Margette Fast Total critical care time: 35 minutes Critical care time was exclusive of separately billable procedures and treating other patients. Critical care was necessary to treat or prevent imminent or life-threatening deterioration. Critical care was time spent personally by me on the following activities: development of treatment plan with patient and/or surrogate as well as nursing, discussions with consultants, evaluation of patient's response to treatment, examination of patient, obtaining history from patient or surrogate, ordering and performing treatments and interventions, ordering and review of laboratory studies, ordering and review of radiographic studies, pulse oximetry and re-evaluation of patient's condition.  Nanda [name], MD Emergency Medicine   ____________________________________________   INITIAL IMPRESSION / ASSESSMENT AND  PLAN / ED COURSE  Pertinent labs & imaging results that were available during my care of the patient were reviewed by me and considered in my medical decision making (see chart for details).   Patient presents to the emergency department with encephalopathy and some flattening of the right nasolabial fold.   Stroke is on my differential but patient last seen normal at 8 PM yesterday. VAN negative so doubt AVO but in discussion with Neurology on call will move forward with CTA head/neck as well as CT head w/o consult.   Labs are largely unremarkable.  No clear cause for the patient's encephalopathy.  Seizure is on the differential in addition to stroke.  Plan for MRI but I am told by the technician that the MRI machine is down for the next several hours. Will discuss [facility] admit.   Discussed patient's case with [facility] to request admission. Patient and family (if present) updated with plan. Care transferred to [facility] service.  I reviewed all nursing notes, vitals, pertinent old records, EKGs, labs, imaging (as available).    ____________________________________________  FINAL CLINICAL IMPRESSION(S) / ED DIAGNOSES  Final diagnoses:  Disorientation     MEDICATIONS GIVEN DURING THIS VISIT:  Medications  rosuvastatin (CRESTOR) tablet 5 mg (has no administration in time range)  escitalopram (LEXAPRO) tablet 5 mg (has no administration in time range)  levothyroxine (SYNTHROID) tablet 25 mcg (has no administration in time range)  pantoprazole (PROTONIX) EC tablet 40 mg (has no administration in time range)  ferrous sulfate tablet 325 mg (has no administration in time range)  folic acid (FOLVITE) tablet 1 mg (has no administration in time range)  vitamin B-12 (CYANOCOBALAMIN) tablet 1,000 mcg (has no administration in time range)   stroke: mapping our early stages of recovery book (has no administration in time range)  0.9 %  sodium chloride infusion (has no administration in time range)  acetaminophen (TYLENOL) tablet 650 mg (has no administration in time range)    Or  acetaminophen (TYLENOL) 160 MG/5ML solution 650 mg (has no administration in time range)    Or  acetaminophen (TYLENOL) suppository 650 mg (has no administration in time range)  enoxaparin (LOVENOX) injection 40 mg (has no  administration in time range)  labetalol (NORMODYNE) injection 10 mg (has no administration in time range)  iohexol (OMNIPAQUE) 350 MG/ML injection 50 mL (50 mLs Intravenous Contrast Given 11/20/20 1246)     Note:  This document was prepared using Dragon voice recognition software and may include unintentional dictation errors.  Nanda [name], MD, [facility] Emergency Medicine    Messiah Rovira, [place], MD 11/20/20 [phone]

## 2020-11-21 ENCOUNTER — Inpatient Hospital Stay (HOSPITAL_COMMUNITY)
Admit: 2020-11-21 | Discharge: 2020-11-21 | Disposition: A | Discharge status: Home / Self Care | Payer: Medicare Other | Attending: Internal Medicine | Admitting: Internal Medicine

## 2020-11-21 DIAGNOSIS — R4182 Altered mental status, unspecified: Secondary | ICD-10-CM

## 2020-11-21 LAB — LIPID PANEL
Cholesterol: 130 mg/dL (ref 0–200)
HDL: 49 mg/dL (ref >40)
LDL Cholesterol: 71 mg/dL (ref 0–99)
Total CHOL/HDL Ratio: 2.7 RATIO
Triglycerides: 50 mg/dL (ref <150)
VLDL: 10 mg/dL (ref 0–40)

## 2020-11-21 MED ORDER — ASPIRIN EC 81 MG PO TBEC
81.0000 mg | DELAYED_RELEASE_TABLET | Freq: Every day | ORAL | Status: DC
  Administered 2020-11-22: 81 mg via ORAL
  Filled 2020-11-21: qty 1

## 2020-11-21 NOTE — Plan of Care (Signed)
  Problem: Education: Goal: Knowledge of General Education information will improve Description Including pain rating scale, medication(s)/side effects and non-pharmacologic comfort measures Outcome: Progressing   Problem: Health Behavior/Discharge Planning: Goal: Ability to manage health-related needs will improve Outcome: Progressing   

## 2020-11-21 NOTE — Progress Notes (Signed)
Initial Nutrition Assessment  DOCUMENTATION CODES:   Not applicable  INTERVENTION:  Ensure Enlive po daily each supplement provides 350 kcal and 20 grams of protein   Food preferences obtained   NUTRITION DIAGNOSIS:   Inadequate oral intake related to acute illness as evidenced by per patient/family report usual meal pattern 2 times daily.   GOAL:  Patient will meet greater than or equal to 90% of their needs   MONITOR:  PO intake, Supplement acceptance, Weight trends, Labs  REASON FOR ASSESSMENT:   Malnutrition Screening Tool    ASSESSMENT: Patient is a 80 yo female presents with altered mental status after being found by her son on the floor. Acute encephalopathy, HTN and hx of chronic anemia, dyslipidemia.  ST bedside swallow. Mild asp risk. D3 diet /thin liquids.   Patient ate 100% of meal at lunch. Feeds herself. Usually eats  2 meal daily at home. A late breakfast around 10 am and dinner in the evening, snacks as desired.   Her weight has been stable mainly between 77-79 kg the past 6 months.   Medications reviewed and include: ferrous sulfate, folic acid, Vit C-94.   Labs:  BMP Latest Ref Rng & Units 11/20/2020 11/20/2020 11/20/2020  Glucose 70 - 99 mg/dL - 113(H) 118(H)  BUN 8 - 23 mg/dL - 18 19  Creatinine 0.44 - 1.00 mg/dL 0.91 1.10(H) 1.08(H)  Sodium 135 - 145 mmol/L - 138 134(L)  Potassium 3.5 - 5.1 mmol/L - 4.3 4.1  Chloride 98 - 111 mmol/L - 101 101  CO2 22 - 32 mmol/L - - 28  Calcium 8.9 - 10.3 mg/dL - - 8.5(L)     NUTRITION - FOCUSED PHYSICAL EXAM:  Nutrition-Focused physical exam completed. Findings are no fat depletion, mild temporal muscle depletion, and no edema.  Walked 25' with  min asst- per PT today. Independent at home.   Diet Order:   Diet Order             DIET DYS 3 Room service appropriate? Yes; Fluid consistency: Thin  Diet effective now                   EDUCATION NEEDS:  Education needs have been addressedAddressed    Skin:  Skin Assessment: Reviewed RN Assessment  Last BM:  6/29 type 3 medium  Height:   Ht Readings from Last 1 Encounters:  11/20/20 5\' 8"  (1.727 m)    Weight:   Wt Readings from Last 1 Encounters:  11/20/20 77.6 kg    Ideal Body Weight:   64 kg  BMI:  Body mass index is 26 kg/m.  Estimated Nutritional Needs:   Kcal:  1600-1700  Protein:  77-83 gr  Fluid:  1.6-1.7 liters daily  Colman Cater MS,RD,CSG,LDN Contact: Shea Evans

## 2020-11-21 NOTE — Progress Notes (Signed)
PROGRESS NOTE    Denise Page  YQI:[id number] DOB: Dec 10, 1940 DOA: 11/20/2020 PCP: Rosalee Kaufman, PA-C   Brief Narrative:   Denise Page is a 80 y.o. female with medical history significant for anemia, aortic stenosis, severe mitral regurgitation, hypothyroidism, dyslipidemia, and hypertension who was brought to the ED via EMS after her son found her down on the floor in her bedroom at approximately 1030 on the morning of admission.  She was thought to have a possible TIA versus CVA, however her brain MRI has returned negative.  EEG is currently pending.  PT recommending rehab placement, but patient and family would like discharge to home.  Assessment & Plan:   Active Problems:   Acute encephalopathy   Acute encephalopathy suspect TIA/CVA versus seizure activity -Brain MRI pending -Avoid 2D echocardiogram as patient had TEE on 6/23 with no thrombus noted -LDL 71 hemoglobin A1c pending -SLP with recommendation for dysphagia 3 diet, OT, PT evaluation with recommendation for SNF -Fall precautions -Maintain on statin and decrease ASA to 81 mg -Continue monitoring on telemetry -Ammonia and TSH within normal limits -Urine analysis negative for UTI -Normal saline IV   History of hypertension -Hold home blood pressure medications and allow permissive hypertension -As needed labetalol as needed for severe elevations   History of dyslipidemia -Continue Crestor and check lipid panel as noted above   Hypothyroidism -Continue home Synthroid -TSH 3.5   Severe MR/moderate AAS -Per cardiology with need for valve replacement noted   Chronic anemia -History of prior upper GI bleeds with need for transfusions -Continue monitoring repeat CBC -No sign of overt bleeding currently noted   DVT prophylaxis:Lovenox Code Status: Full Family Communication: Daughter, Denise Page at bedside 7/1 Disposition Plan:  Status is: Inpatient  Remains inpatient appropriate because:Altered  mental status, Ongoing diagnostic testing needed not appropriate for outpatient work up, and Unsafe d/c plan  Dispo: The patient is from: Home              Anticipated d/c is to: Home              Patient currently is not medically stable to d/c.   Difficult to place patient No   Consultants:  None  Procedures:  See below  Antimicrobials:  None   Subjective: Patient seen and evaluated today with no new acute complaints or concerns. No acute concerns or events noted overnight.  Objective: Vitals:   11/21/20 0230 11/21/20 0430 11/21/20 0630 11/21/20 1018  BP: 125/60 (!) 119/50 (!) 121/46 (!) 139/52  Pulse: 71 67 72 63  Resp: 17 17 17 19   Temp: 98.7 F (37.1 C) 98.3 F (36.8 C) 98.6 F (37 C)   TempSrc: Oral Oral Oral   SpO2: 98% 96% 94% 98%  Weight:      Height:        Intake/Output Summary (Last 24 hours) at 11/21/2020 1302 Last data filed at 11/21/2020 0500 Gross per 24 hour  Intake 201.25 ml  Output 850 ml  Net -648.75 ml   Filed Weights   11/20/20 1136  Weight: 77.6 kg    Examination:  General exam: Appears calm and comfortable  Respiratory system: Clear to auscultation. Respiratory effort normal. Cardiovascular system: S1 & S2 heard, RRR.  Gastrointestinal system: Abdomen is soft Central nervous system: Alert and awake Extremities: No edema Skin: No significant lesions noted Psychiatry: Flat affect.    Data Reviewed: I have personally reviewed following labs and imaging studies  CBC: Recent Labs  Lab 11/20/20 1141 11/20/20  1147 11/20/20 1601  WBC 4.6  --  4.2  NEUTROABS 2.7  --   --   HGB 11.3* 11.6* 11.4*  HCT 34.3* 34.0* 34.4*  MCV 92.0  --  91.0  PLT 133*  --  295*   Basic Metabolic Panel: Recent Labs  Lab 11/20/20 1141 11/20/20 1147 11/20/20 1601  NA 134* 138  --   K 4.1 4.3  --   CL 101 101  --   CO2 28  --   --   GLUCOSE 118* 113*  --   BUN 19 18  --   CREATININE 1.08* 1.10* 0.91  CALCIUM 8.5*  --   --    GFR: Estimated  Creatinine Clearance: 54.9 mL/min (by C-G formula based on SCr of 0.91 mg/dL). Liver Function Tests: Recent Labs  Lab 11/20/20 1141  AST 22  ALT 13  ALKPHOS 48  BILITOT 0.6  PROT 6.8  ALBUMIN 4.2   No results for input(s): LIPASE, AMYLASE in the last 168 hours. Recent Labs  Lab 11/20/20 1157  AMMONIA 26   Coagulation Profile: Recent Labs  Lab 11/20/20 1141  INR 1.2   Cardiac Enzymes: Recent Labs  Lab 11/20/20 1141  CKTOTAL 361*   BNP (last 3 results) No results for input(s): PROBNP in the last 8760 hours. HbA1C: No results for input(s): HGBA1C in the last 72 hours. CBG: No results for input(s): GLUCAP in the last 168 hours. Lipid Profile: Recent Labs    11/21/20 0525  CHOL 130  HDL 49  LDLCALC 71  TRIG 50  CHOLHDL 2.7   Thyroid Function Tests: Recent Labs    11/20/20 1141  TSH 3.533   Anemia Panel: No results for input(s): VITAMINB12, FOLATE, FERRITIN, TIBC, IRON, RETICCTPCT in the last 72 hours. Sepsis Labs: No results for input(s): PROCALCITON, LATICACIDVEN in the last 168 hours.  Recent Results (from the past 240 hour(s))  Resp Panel by RT-PCR (Flu A&B, Covid) Nasopharyngeal Swab     Status: None   Collection Time: 11/20/20  2:59 PM   Specimen: Nasopharyngeal Swab; Nasopharyngeal(NP) swabs in vial transport medium  Result Value Ref Range Status   SARS Coronavirus 2 by RT PCR NEGATIVE NEGATIVE Final    Comment: (NOTE) SARS-CoV-2 target nucleic acids are NOT DETECTED.  The SARS-CoV-2 RNA is generally detectable in upper respiratory specimens during the acute phase of infection. The lowest concentration of SARS-CoV-2 viral copies this assay can detect is 138 copies/mL. A negative result does not preclude SARS-Cov-2 infection and should not be used as the sole basis for treatment or other patient management decisions. A negative result may occur with  improper specimen collection/handling, submission of specimen other than nasopharyngeal swab,  presence of viral mutation(s) within the areas targeted by this assay, and inadequate number of viral copies(<138 copies/mL). A negative result must be combined with clinical observations, patient history, and epidemiological information. The expected result is Negative.  Fact Sheet for Patients:  EntrepreneurPulse.com.au  Fact Sheet for Healthcare Providers:  IncredibleEmployment.be  This test is no t yet approved or cleared by the [place] FDA and  has been authorized for detection and/or diagnosis of SARS-CoV-2 by FDA under an Emergency Use Authorization (EUA). This EUA will remain  in effect (meaning this test can be used) for the duration of the COVID-19 declaration under Section 564(b)(1) of the Act, 21 U.S.C.section 360bbb-3(b)(1), unless the authorization is terminated  or revoked sooner.       Influenza A by PCR NEGATIVE NEGATIVE Final  Influenza B by PCR NEGATIVE NEGATIVE Final    Comment: (NOTE) The Xpert Xpress SARS-CoV-2/FLU/RSV plus assay is intended as an aid in the diagnosis of influenza from Nasopharyngeal swab specimens and should not be used as a sole basis for treatment. Nasal washings and aspirates are unacceptable for Xpert Xpress SARS-CoV-2/FLU/RSV testing.  Fact Sheet for Patients: EntrepreneurPulse.com.au  Fact Sheet for Healthcare Providers: IncredibleEmployment.be  This test is not yet approved or cleared by the [place] FDA and has been authorized for detection and/or diagnosis of SARS-CoV-2 by FDA under an Emergency Use Authorization (EUA). This EUA will remain in effect (meaning this test can be used) for the duration of the COVID-19 declaration under Section 564(b)(1) of the Act, 21 U.S.C. section 360bbb-3(b)(1), unless the authorization is terminated or revoked.  Performed at [facility], [address] [place], [place] [address]          Radiology  Studies: CT Angio Head W or Wo Contrast  Result Date: 11/20/2020 CLINICAL DATA:  Stroke/TIA.  Found down. EXAM: CT HEAD WITHOUT CONTRAST CT ANGIOGRAPHY OF THE HEAD AND NECK TECHNIQUE: Contiguous axial images were obtained from the base of the skull through the vertex without intravenous contrast. Multidetector CT imaging of the head and neck was performed using the standard protocol during bolus administration of intravenous contrast. Multiplanar CT image reconstructions and MIPs were obtained to evaluate the vascular anatomy. Carotid stenosis measurements (when applicable) are obtained utilizing NASCET criteria, using the distal internal carotid diameter as the denominator. CONTRAST:  22mL OMNIPAQUE IOHEXOL 350 MG/ML SOLN COMPARISON:  None. FINDINGS: CT HEAD Brain: No evidence of acute large vascular territory infarction, hemorrhage, hydrocephalus, extra-axial collection or mass lesion/mass effect. Partially empty sella. Vascular: See below. Skull: No acute fracture. Visualized sinuses are largely clear. No acute orbital findings. Sinuses: Visualized sinuses are largely clear. No acute orbital findings. Other: No mastoid effusions. CTA NECK Aortic arch: Great vessel origins are patent. Right carotid system: Moderate calcific and noncalcific atherosclerosis at the carotid bifurcation without greater than 50% narrowing. Left carotid system: Mild atherosclerosis at the carotid bifurcation without greater than 50% stenosis. Vertebral arteries:Mild left dominant. Patent without greater than 50% stenosis. Skeleton: Please see concurrent CT cervical spine for further evaluation of the spine. Other neck: Enlarged and heterogeneous thyroid gland. This has been evaluated on previous imaging. (ref: J Am Coll Radiol. 2015 Feb;12(2): 143-50). CTA HEAD Motion limited evaluation. Anterior circulation: Calcific atherosclerosis of the right greater than left cavernous and paraclinoid ICAs without evidence of greater than 50%  narrowing. No evidence of large vessel occlusion or proximal flow limiting stenosis. Limited evaluation of the distal MCAs and ACAs due to patient motion and venous timing. Posterior circulation: Small vertebrobasilar system without evidence of a large vessel occlusion are flow limiting proximal stenosis. Left posterior communicating artery. Venous sinuses: No evidence of dural venous sinus thrombosis. IMPRESSION: CT head: No evidence of acute large vascular territory infarct or acute hemorrhage. CTA head: 1. No large vessel occlusion or flow limiting proximal stenosis. Limited evaluation distally due to patient motion and venous contamination. 2. Small vertebrobasilar system. CTA neck: 1. No evidence of significant (greater than 50%) stenosis. 2. Left greater than right carotid bifurcation atherosclerosis. 3. Please see concurrent CT of the cervical spine for evaluation of the cervical spine. Electronically Signed   By: Margaretha Sheffield MD   On: 11/20/2020 13:18   DG Chest 2 View  Result Date: 11/20/2020 CLINICAL DATA:  Fall. EXAM: CHEST - 2 VIEW COMPARISON:  March 19, 2020. FINDINGS: Stable cardiomegaly. No pneumothorax or pleural effusion is noted. Mild central pulmonary vascular may be present with possible bibasilar atelectasis or edema. No acute bony abnormality is noted. IMPRESSION: Stable cardiomegaly with mild central pulmonary vascular congestion. Possible bibasilar subsegmental atelectasis or edema is noted. Electronically Signed   By: Marijo Conception M.D.   On: 11/20/2020 13:43   DG Pelvis 1-2 Views  Result Date: 11/20/2020 CLINICAL DATA:  Status post fall. EXAM: PELVIS - 1-2 VIEW COMPARISON:  None. FINDINGS: There is no evidence of pelvic fracture or diastasis. No pelvic bone lesions are seen. Contrast in the ureters and bladder from the patient's CT angiogram of the head and neck today noted. IMPRESSION: No acute abnormality. Electronically Signed   By: Inge Rise M.D.   On: 11/20/2020  13:44   CT Angio Neck W and/or Wo Contrast  Result Date: 11/20/2020 CLINICAL DATA:  Stroke/TIA.  Found down. EXAM: CT HEAD WITHOUT CONTRAST CT ANGIOGRAPHY OF THE HEAD AND NECK TECHNIQUE: Contiguous axial images were obtained from the base of the skull through the vertex without intravenous contrast. Multidetector CT imaging of the head and neck was performed using the standard protocol during bolus administration of intravenous contrast. Multiplanar CT image reconstructions and MIPs were obtained to evaluate the vascular anatomy. Carotid stenosis measurements (when applicable) are obtained utilizing NASCET criteria, using the distal internal carotid diameter as the denominator. CONTRAST:  26mL OMNIPAQUE IOHEXOL 350 MG/ML SOLN COMPARISON:  None. FINDINGS: CT HEAD Brain: No evidence of acute large vascular territory infarction, hemorrhage, hydrocephalus, extra-axial collection or mass lesion/mass effect. Partially empty sella. Vascular: See below. Skull: No acute fracture. Visualized sinuses are largely clear. No acute orbital findings. Sinuses: Visualized sinuses are largely clear. No acute orbital findings. Other: No mastoid effusions. CTA NECK Aortic arch: Great vessel origins are patent. Right carotid system: Moderate calcific and noncalcific atherosclerosis at the carotid bifurcation without greater than 50% narrowing. Left carotid system: Mild atherosclerosis at the carotid bifurcation without greater than 50% stenosis. Vertebral arteries:Mild left dominant. Patent without greater than 50% stenosis. Skeleton: Please see concurrent CT cervical spine for further evaluation of the spine. Other neck: Enlarged and heterogeneous thyroid gland. This has been evaluated on previous imaging. (ref: J Am Coll Radiol. 2015 Feb;12(2): 143-50). CTA HEAD Motion limited evaluation. Anterior circulation: Calcific atherosclerosis of the right greater than left cavernous and paraclinoid ICAs without evidence of greater than 50%  narrowing. No evidence of large vessel occlusion or proximal flow limiting stenosis. Limited evaluation of the distal MCAs and ACAs due to patient motion and venous timing. Posterior circulation: Small vertebrobasilar system without evidence of a large vessel occlusion are flow limiting proximal stenosis. Left posterior communicating artery. Venous sinuses: No evidence of dural venous sinus thrombosis. IMPRESSION: CT head: No evidence of acute large vascular territory infarct or acute hemorrhage. CTA head: 1. No large vessel occlusion or flow limiting proximal stenosis. Limited evaluation distally due to patient motion and venous contamination. 2. Small vertebrobasilar system. CTA neck: 1. No evidence of significant (greater than 50%) stenosis. 2. Left greater than right carotid bifurcation atherosclerosis. 3. Please see concurrent CT of the cervical spine for evaluation of the cervical spine. Electronically Signed   By: Margaretha Sheffield MD   On: 11/20/2020 13:18   MR BRAIN WO CONTRAST  Result Date: 11/20/2020 CLINICAL DATA:  Mental status change EXAM: MRI HEAD WITHOUT CONTRAST TECHNIQUE: Multiplanar, multiecho pulse sequences of the brain and surrounding structures were obtained without intravenous contrast.  COMPARISON:  CT head 11/20/2020 FINDINGS: Brain: Motion degraded study Negative for acute infarct. Negative for hemorrhage or mass. Ventricle size normal. Mild white matter FLAIR hyperintensity bilaterally. Brainstem intact. Vascular: Normal arterial flow voids Skull and upper cervical spine: Negative Sinuses/Orbits: Mild mucosal edema paranasal sinuses. Negative orbit Other: None IMPRESSION: No acute abnormality.  Motion degraded study Electronically Signed   By: Franchot Gallo M.D.   On: 11/20/2020 19:03   US Carotid Bilateral (at [facility] and [facility] only)  Result Date: 11/20/2020 CLINICAL DATA:  CVA. Altered mental status. History of CAD, hypertension and hyperlipidemia. EXAM: BILATERAL CAROTID DUPLEX  ULTRASOUND TECHNIQUE: Pearline Cables scale imaging, color Doppler and duplex ultrasound were performed of bilateral carotid and vertebral arteries in the neck. COMPARISON:  Carotid Doppler ultrasound - 03/20/2020; thyroid ultrasound - 03/20/2020 FINDINGS: Criteria: Quantification of carotid stenosis is based on velocity parameters that correlate the residual internal carotid diameter with NASCET-based stenosis levels, using the diameter of the distal internal carotid lumen as the denominator for stenosis measurement. The following velocity measurements were obtained: RIGHT ICA: 146/40 cm/sec CCA: 26/71 cm/sec SYSTOLIC ICA/CCA RATIO:  1.7 ECA: 66 cm/sec LEFT ICA: 102/29 cm/sec CCA: 24/58 cm/sec SYSTOLIC ICA/CCA RATIO:  1.1 ECA: 94 cm/sec RIGHT CAROTID ARTERY: There is a moderate amount of eccentric echogenic plaque within the right carotid bulb (image 15 and 17), extending to involve the origin and proximal aspects the right internal carotid artery (image 25), morphologically similar to the 02/2020 examination resulting in mildly elevated peak systolic velocities within the mid and distal aspects of the right internal carotid artery though velocity measurements may be artifactually elevated due to vessel tortuosity (representative image 29). Greatest acquired peak systolic velocity within distal aspect the right internal carotid artery measures 146 centimeters/second (image 33), previously, greatest acquired peak systolic velocity within mid right ICA measured 171 centimeters/second. RIGHT VERTEBRAL ARTERY:  Antegrade Flow LEFT CAROTID ARTERY: There is a minimal amount of eccentric echogenic plaque involving the origin and proximal aspects of the left internal carotid artery (image 64), similar to the 02/2020 examination and again not resulting in elevated peak systolic velocities within the interrogated course the left internal carotid artery to suggest a hemodynamically significant stenosis. Mildly elevated peak systolic  velocity within distal aspect the left internal carotid artery is felt to be factitious slightly elevated due to sampling at a location of turbulent flow at the periphery of the vessel. LEFT VERTEBRAL ARTERY:  Antegrade flow Marked heterogeneity of the incidentally imaged thyroid (images 37 through 41). IMPRESSION: 1. Moderate amount of right-sided atherosclerotic plaque, similar to the 10/21 examination, again resulting in elevated peak systolic velocities within the right internal carotid artery compatible with the 50-69% luminal narrowing range, though velocity measurements may be artifactually elevated due to vessel tortuosity. Further evaluation with CTA could be performed as clinically indicated. 2. Minimal amount of left-sided atherosclerotic plaque, similar to the 02/2020 examination and again not resulting in a hemodynamically significant stenosis. 3. 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 Electronically Signed   By: Sandi Mariscal M.D.   On: 11/20/2020 16:51   CT C-SPINE NO CHARGE  Result Date: 11/20/2020 CLINICAL DATA:  Found on floor.  Probable fall. EXAM: CT CERVICAL SPINE WITHOUT CONTRAST TECHNIQUE: Multidetector CT imaging of the cervical spine was performed without intravenous contrast. Multiplanar CT image reconstructions were also generated. COMPARISON:  CT angio head and neck 11/20/2020 FINDINGS: Alignment: Mild anterolisthesis C5-6  Skull base and vertebrae: Negative for fracture or mass lesion. Soft tissues and spinal canal: Mild thyromegaly. No soft tissue mass in the neck. Disc levels: Mild disc degeneration and spurring most prominent C5-6 and C6-7. No significant spinal stenosis. Upper chest: Lung apices clear bilaterally. Other: None IMPRESSION: Negative for cervical fracture. Electronically Signed   By: Franchot Gallo M.D.   On: 11/20/2020 13:20    DG Knee Complete 4 Views Right  Result Date: 11/20/2020 CLINICAL DATA:  Right knee pain after fall. EXAM: RIGHT KNEE - COMPLETE 4+ VIEW COMPARISON:  None. FINDINGS: No evidence of fracture, dislocation, or joint effusion. Moderate to severe narrowing of medial joint space is noted. Soft tissues are unremarkable. 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 No acute abnormality seen. Electronically Signed   By: Marijo Conception M.D.   On: 11/20/2020 13:44        Scheduled Meds:   stroke: mapping our early stages of recovery book   Does not apply Once   aspirin  325 mg Oral Daily   enoxaparin (LOVENOX) injection  40 mg Subcutaneous Q24H   escitalopram  5 mg Oral QHS   ferrous sulfate  325 mg Oral BID WC   folic acid  1 mg Oral q AM   levothyroxine  25 mcg Oral q morning   pantoprazole  40 mg Oral q AM   rosuvastatin  5 mg Oral q1800   vitamin B-12  1,000 mcg Oral q AM     LOS: 1 day    Time spent: 35 minutes    Koya Hunger Darleen Crocker, DO [facility]  If 7PM-7AM, please contact night-coverage [email or web address] 11/21/2020, 1:02 PM

## 2020-11-21 NOTE — Evaluation (Signed)
Clinical/Bedside Swallow Evaluation Patient Details  Name: Denise Page MRN: [id number] Date of Birth: Sep 12, 1940  Today's Date: 11/21/2020 Time: SLP Start Time (ACUTE ONLY): 0930 SLP Stop Time (ACUTE ONLY): 0258 SLP Time Calculation (min) (ACUTE ONLY): 13 min  Past Medical History:  Past Medical History:  Diagnosis Date   Anemia    Aortic stenosis    Bilateral carotid artery disease ([place])    Enlarged thyroid    Hyperlipidemia    Hypertension    NSTEMI (non-ST elevated myocardial infarction) [facility])    Past Surgical History:  Past Surgical History:  Procedure Laterality Date   BIOPSY  08/01/2020   Procedure: BIOPSY;  Surgeon: Ronnette Juniper, MD;  Location: [facility] ENDOS[facility];  Service: Gastroenterology;;  EGD and COLON   CHOLECYSTECTOMY     COLONOSCOPY N/A 08/01/2020   Procedure: COLONOSCOPY;  Surgeon: Ronnette Juniper, MD;  Location: [facility];  Service: Gastroenterology;  Laterality: N/A;   CYSTECTOMY     ESOPHAGOGASTRODUODENOSCOPY (EGD) WITH PROPOFOL N/A 08/01/2020   Procedure: ESOPHAGOGASTRODUODENOSCOPY (EGD) WITH PROPOFOL;  Surgeon: Ronnette Juniper, MD;  Location: [facility];  Service: Gastroenterology;  Laterality: N/A;   POLYPECTOMY  08/01/2020   Procedure: POLYPECTOMY;  Surgeon: Ronnette Juniper, MD;  Location: [facility];  Service: Gastroenterology;;   TEE WITHOUT CARDIOVERSION N/A 11/13/2020   Procedure: TRANSESOPHAGEAL ECHOCARDIOGRAM (TEE);  Surgeon: Josue Hector, MD;  Location: [facility] ENDOSCOPY;  Service: Cardiovascular;  Laterality: N/A;   TONSILLECTOMY     HPI:  Denise Page is a 80 y.o. female with medical history significant for anemia, aortic stenosis, severe mitral regurgitation, hypothyroidism, dyslipidemia, and hypertension who was brought to the ED via EMS after her son found her down on the floor in her bedroom at approximately 1030 this morning.  She was more or less unresponsive and noted to be pale and appeared to have a left-sided facial droop.  She was last seen and known  well at approximately 8 PM last night.  According to the daughter, she normally wakes up earlier in the morning, but the son did not see her come out of her room which is what alarmed him.  She was also noted to be incontinent of urine.  No other signs of trauma were otherwise noted.  Of note, patient has had significant weakness since October of last year according to the daughter, but refuses to use cane or walker.  She has also been noted to have ongoing upper respiratory symptoms with no diagnosis in the last several months after having seen 5 different providers.  She appears to have recently had TEE on 6/23 with plans for valve replacement soon per cardiology.   Assessment / Plan / Recommendation Clinical Impression  Clinical swallowing evaluation completed while Pt was sitting upright in bed; Pt's daughter present for evaluation. Pt consumed thin liquids, puree and regular textures without overt s/sx of aspiration. Pt does report difficulty masticating tough and large pieces of meat and requested a soft diet. Recommend initiate thin liquids and D3/mech soft diet; meds are ok whole with liquids. There are no further needs for dysphagia. SLE to follow SLP Visit Diagnosis: Dysphagia, unspecified (R13.10)    Aspiration Risk  Mild aspiration risk    Diet Recommendation Dysphagia 3 (Mech soft);Thin liquid   Liquid Administration via: Cup;Straw Medication Administration: Whole meds with liquid Supervision: Patient able to self feed;Intermittent supervision to cue for compensatory strategies Compensations: Minimize environmental distractions;Slow rate;Small sips/bites Postural Changes: Seated upright at 90 degrees;Remain upright for at least 30 minutes after po intake  Other  Recommendations Oral Care Recommendations: Oral care BID   Follow up Recommendations None        Swallow Study   General Date of Onset: 11/20/20 HPI: Denise Page is a 80 y.o. female with medical history  significant for anemia, aortic stenosis, severe mitral regurgitation, hypothyroidism, dyslipidemia, and hypertension who was brought to the ED via EMS after her son found her down on the floor in her bedroom at approximately 1030 this morning.  She was more or less unresponsive and noted to be pale and appeared to have a left-sided facial droop.  She was last seen and known well at approximately 8 PM last night.  According to the daughter, she normally wakes up earlier in the morning, but the son did not see her come out of her room which is what alarmed him.  She was also noted to be incontinent of urine.  No other signs of trauma were otherwise noted.  Of note, patient has had significant weakness since October of last year according to the daughter, but refuses to use cane or walker.  She has also been noted to have ongoing upper respiratory symptoms with no diagnosis in the last several months after having seen 5 different providers.  She appears to have recently had TEE on 6/23 with plans for valve replacement soon per cardiology. Type of Study: Bedside Swallow Evaluation Previous Swallow Assessment: none in chart Diet Prior to this Study: Regular;Thin liquids Temperature Spikes Noted: No Respiratory Status: Room air History of Recent Intubation: No Behavior/Cognition: Alert;Cooperative Oral Cavity Assessment: Within Functional Limits Oral Care Completed by SLP: Recent completion by staff Oral Cavity - Dentition: Missing dentition;Poor condition Vision: Functional for self-feeding Self-Feeding Abilities: Able to feed self Patient Positioning: Upright in bed Baseline Vocal Quality: Normal Volitional Cough: Strong Volitional Swallow: Able to elicit    Oral/Motor/Sensory Function Overall Oral Motor/Sensory Function: Within functional limits   Ice Chips Ice chips: Within functional limits   Thin Liquid Thin Liquid: Within functional limits    Nectar Thick Nectar Thick Liquid: Not tested    Honey Thick Honey Thick Liquid: Not tested   Puree Puree: Within functional limits   Solid     Solid: Within functional limits     Denise Page H. Roddie Mc, CCC-SLP Speech Language Pathologist  Wende Bushy 11/21/2020,9:58 AM

## 2020-11-21 NOTE — Evaluation (Signed)
Speech Language Pathology Evaluation Patient Details Name: Denise Page MRN: [id number] DOB: 04-16-1941 Today's Date: 11/21/2020 Time: 6387-5643 SLP Time Calculation (min) (ACUTE ONLY): 16 min  Problem List:  Patient Active Problem List   Diagnosis Date Noted   Acute encephalopathy 11/20/2020   Other pancytopenia ([place]) 09/30/2020   Pulmonary HTN ([place]) 04/22/2020   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   Past Medical History:  Past Medical History:  Diagnosis Date   Anemia    Aortic stenosis    Bilateral carotid artery disease ([place])    Enlarged thyroid    Hyperlipidemia    Hypertension    NSTEMI (non-ST elevated myocardial infarction) [facility])    Past Surgical History:  Past Surgical History:  Procedure Laterality Date   BIOPSY  08/01/2020   Procedure: BIOPSY;  Surgeon: Ronnette Juniper, MD;  Location: [facility];  Service: Gastroenterology;;  EGD and COLON   CHOLECYSTECTOMY     COLONOSCOPY N/A 08/01/2020   Procedure: COLONOSCOPY;  Surgeon: Ronnette Juniper, MD;  Location: [facility];  Service: Gastroenterology;  Laterality: N/A;   CYSTECTOMY     ESOPHAGOGASTRODUODENOSCOPY (EGD) WITH PROPOFOL N/A 08/01/2020   Procedure: ESOPHAGOGASTRODUODENOSCOPY (EGD) WITH PROPOFOL;  Surgeon: Ronnette Juniper, MD;  Location: [facility];  Service: Gastroenterology;  Laterality: N/A;   POLYPECTOMY  08/01/2020   Procedure: POLYPECTOMY;  Surgeon: Ronnette Juniper, MD;  [facility];  Service: Gastroenterology;;   TEE WITHOUT CARDIOVERSION N/A 11/13/2020   Procedure: TRANSESOPHAGEAL ECHOCARDIOGRAM (TEE);  Surgeon: Josue Hector, MD;  Location: [facility] ENDOSCOPY;  Service: Cardiovascular;  Laterality: N/A;   TONSILLECTOMY     HPI:  Denise Page is a 80 y.o. female with medical history significant for anemia, aortic stenosis, severe mitral regurgitation, hypothyroidism, dyslipidemia, and hypertension who was brought to the ED via EMS after her son found her down on the floor in her bedroom at  approximately 1030 this morning.  She was more or less unresponsive and noted to be pale and appeared to have a left-sided facial droop.  She was last seen and known well at approximately 8 PM last night.  According to the daughter, she normally wakes up earlier in the morning, but the son did not see her come out of her room which is what alarmed him.  She was also noted to be incontinent of urine.  No other signs of trauma were otherwise noted.  Of note, patient has had significant weakness since October of last year according to the daughter, but refuses to use cane or walker.  She has also been noted to have ongoing upper respiratory symptoms with no diagnosis in the last several months after having seen 5 different providers.  She appears to have recently had TEE on 6/23 with plans for valve replacement soon per cardiology. MRI was negative for acute abnormality.  Assessment / Plan / Recommendation Clinical Impression  Speech and language evaluation completed while Pt's daughter was present. Pt's daughter reports Pt's cognition is normally "very good" with no memory problems. Pt presented today with severe cognitive impairment indicated from [place] score of 5/30. Pt was oriented X3 but was unaware of the year; she also reported to OT and PT that she lives at home with her mother. She was unable to recall any of the 5 words with a slight delay, demonstrated significant impairment in thought organization, problem solving and short term recall. Further note she was unable to appropriately place numbers on a clock (all numbers and both hands concentrated in the upper R quadrant).  When asked to count backwards from 5, after a brief pause Pt began skip counting up "5, 10, 15, 20, 25, 30" without awareness that this was incorrect. This is a significant cognitive change from baseline per family. Pt will benefit from a more in depth cognitive assessment and treatment for cognition as indicated at recommended  venue below. There are no further ST needs noted while in acute. ST will sign off at this time. Thank you for this referral,    SLP Assessment  SLP Recommendation/Assessment: All further Speech Lanaguage Pathology  needs can be addressed in the next venue of care SLP Visit Diagnosis: Cognitive communication deficit (R41.841)    Follow Up Recommendations  Skilled Nursing facility;24 hour supervision/assistance          SLP Evaluation Cognition  Overall Cognitive Status: Impaired/Different from baseline Arousal/Alertness: Awake/alert Orientation Level: Oriented X4 Attention: Focused Focused Attention: Impaired Memory: Impaired Memory Impairment: Decreased short term memory;Decreased recall of new information Decreased Short Term Memory: Verbal basic Awareness: Impaired Problem Solving: Impaired Executive Function: Reasoning;Organizing;Decision Making Reasoning: Impaired Organizing: Impaired Decision Making: Impaired       Comprehension  Auditory Comprehension Overall Auditory Comprehension: Appears within functional limits for tasks assessed Visual Recognition/Discrimination Discrimination: Not tested Reading Comprehension Reading Status: Not tested    Expression Expression Primary Mode of Expression: Verbal Verbal Expression Overall Verbal Expression: Appears within functional limits for tasks assessed   Oral / Motor  Oral Motor/Sensory Function Overall Oral Motor/Sensory Function: Within functional limits Motor Speech Overall Motor Speech: Appears within functional limits for tasks assessed      Kennith Morss H. Roddie Mc, CCC-SLP Speech Language Pathologist         Wende Bushy 11/21/2020, 10:12 AM

## 2020-11-21 NOTE — Plan of Care (Signed)
  Problem: Acute Rehab PT Goals(only PT should resolve) Goal: Patient Will Transfer Sit To/From Stand Outcome: Progressing Flowsheets (Taken 11/21/2020 0902) Patient will transfer sit to/from stand: with min guard assist Goal: Pt Will Transfer Bed To Chair/Chair To Bed Outcome: Progressing Flowsheets (Taken 11/21/2020 0902) Pt will Transfer Bed to Chair/Chair to Bed: min guard assist Goal: Pt Will Ambulate Outcome: Progressing Flowsheets (Taken 11/21/2020 0902) Pt will Ambulate:  25 feet  with min guard assist  with least restrictive assistive device Goal: Pt/caregiver will Perform Home Exercise Program Outcome: Progressing Flowsheets (Taken 11/21/2020 0902) Pt/caregiver will Perform Home Exercise Program:  For increased strengthening  For improved balance  Independently  9:03 AM, 11/21/20 Mearl Latin PT, DPT Physical Therapist at [facility]

## 2020-11-21 NOTE — Plan of Care (Signed)
  Problem: Acute Rehab OT Goals (only OT should resolve) Goal: Pt. Will Perform Grooming Flowsheets (Taken 11/21/2020 1046) Pt Will Perform Grooming:  standing  Independently  with modified independence  with adaptive equipment Goal: Pt. Will Transfer To Toilet Flowsheets (Taken 11/21/2020 1046) Pt Will Transfer to Toilet:  with modified independence  ambulating  stand pivot transfer Goal: Pt/Caregiver Will Perform Home Exercise Program Flowsheets (Taken 11/21/2020 1046) Pt/caregiver will Perform Home Exercise Program:  Increased strength  Both right and left upper extremity  Sarah Baez OT, MOT

## 2020-11-21 NOTE — TOC Initial Note (Signed)
Transition of Care [facility]) - Initial/Assessment Note    Patient Details  Name: Denise Page MRN: [id number] Date of Birth: 08/30/1940  [facility] CM/SW Contact:    Natasha Bence, LCSW Phone Number: 11/21/2020, 1:09 PM  Clinical Narrative:                 Patient is a23 year old female admitted for Acute encephalopathy. CSW conducted initial assessment. Patient lives with her son and his wife. Patient is able to complete most ADL's with minimal assistance. However patient receives help from son and daughter in law when needed. Patient's daughter reported that patient and family would like to decline SNF and HH at the time due to having 24 hour supervision at home. Patient's daughter agreeable to revisit [place] if patient's condition alters or if additional concerns are made. TOC to follow.  Expected Discharge Plan: Home/Self Care Barriers to Discharge: Continued Medical Work up   Patient Goals and CMS Choice Patient states their goals for this hospitalization and ongoing recovery are:: Return home CMS Medicare.gov Compare Post Acute Care list provided to:: Patient Choice offered to / list presented to : Patient  Expected Discharge Plan and Services Expected Discharge Plan: Home/Self Care                                              Prior Living Arrangements/Services   Lives with:: Self, Adult Children Patient language and need for interpreter reviewed:: No        Need for Family Participation in Patient Care: Yes (Comment) Care giver support system in place?: Yes (comment)   Criminal Activity/Legal Involvement Pertinent to Current Situation/Hospitalization: No - Comment as needed  Activities of Daily Living Home Assistive Devices/Equipment: None ADL Screening (condition at time of admission) Patient's cognitive ability adequate to safely complete daily activities?: Yes Is the patient deaf or have difficulty hearing?: No Does the patient have difficulty  seeing, even when wearing glasses/contacts?: No Does the patient have difficulty concentrating, remembering, or making decisions?: Yes Patient able to express need for assistance with ADLs?: Yes Does the patient have difficulty dressing or bathing?: No Independently performs ADLs?: Yes (appropriate for developmental age) Does the patient have difficulty walking or climbing stairs?: Yes Weakness of Legs: Both Weakness of Arms/Hands: Both  Permission Sought/Granted Permission sought to share information with : Facility Art therapist granted to share information with : Yes, Release of Information Signed  Share Information with NAME: Bo Mcclintock     Permission granted to share info w Relationship: (Daughter)  Permission granted to share info w Contact Information: [phone]  Emotional Assessment       Orientation: : Oriented to Self, Oriented to Place, Oriented to  Time Alcohol / Substance Use: Not Applicable Psych Involvement: No (comment)  Admission diagnosis:  Trauma [T14.90XA] Disorientation [R41.0] CVA (cerebral vascular accident) [facility]) [I63.9] Acute encephalopathy [G93.40] Patient Active Problem List   Diagnosis Date Noted   Acute encephalopathy 11/20/2020   Other pancytopenia ([place]) 09/30/2020   Pulmonary HTN ([place]) 04/22/2020   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   PCP:  Rosalee Kaufman, PA-C Pharmacy:   [place], [place] [address] [place] [address] Phone: [phone] Fax: [phone]     Social Determinants of Health (SDOH) Interventions    Readmission Risk Interventions No flowsheet  data found.

## 2020-11-21 NOTE — Procedures (Signed)
Patient Name: Denise Page  MRN: [id number]  Epilepsy Attending: Lora Havens  Referring Physician/Provider: Dr. Heath Lark Date: 11/21/2020 Duration: 23.33 mins  Patient history: 80 year old female with altered mental status.  EEG to evaluate for seizures.  Level of alertness: Awake  AEDs during EEG study: None  Technical aspects: This EEG study was done with scalp electrodes positioned according to the 10-20 International system of electrode placement. Electrical activity was acquired at a sampling rate of 500Hz  and reviewed with a high frequency filter of 70Hz  and a low frequency filter of 1Hz . EEG data were recorded continuously and digitally stored.   Description: No posterior dominant rhythm was seen. EEG showed continuous generalized and maximal bitemporal 5 to 6 Hz theta as well as intermittent 2-3hz  delta slowing, at times with triphasic morphology. Hyperventilation and photic stimulation were not performed.     ABNORMALITY - Continuous slow, generalized and maximal bitemporal   IMPRESSION: This study is suggestive of cortical dysfunction arising from bitemporal region, nonspecific etiology. There is also moderate diffuse encephalopathy, nonspecific etiology but could be secondary to toxic-metabolic causes.  No seizures or definite epileptiform discharges were seen throughout the recording.Lora Havens

## 2020-11-21 NOTE — Care Management Important Message (Signed)
Important Message  Patient Details  Name: Denise Page MRN: [id number] Date of Birth: Nov 19, 1940   Medicare Important Message Given:  Yes     Tommy Medal 11/21/2020, 4:06 PM

## 2020-11-21 NOTE — Progress Notes (Signed)
Patient pulled 18 gauge IV to left AC out. Refusing to let me place another IV. Have explained to patient need for IV access, still refuses. Charge nurse and MD notified.

## 2020-11-21 NOTE — Progress Notes (Signed)
EEG completed, results pending. 

## 2020-11-21 NOTE — Evaluation (Signed)
Physical Therapy Evaluation Patient Details Name: Denise Page MRN: [id number] DOB: 1941/02/06 Today's Date: 11/21/2020   History of Present Illness  Denise Page is a 80 y.o. female with medical history significant for anemia, aortic stenosis, severe mitral regurgitation, hypothyroidism, dyslipidemia, and hypertension who was brought to the ED via EMS after her son found her down on the floor in her bedroom at approximately 1030 this morning.  She was more or less unresponsive and noted to be pale and appeared to have a left-sided facial droop.  She was last seen and known well at approximately 8 PM last night.  According to the daughter, she normally wakes up earlier in the morning, but the son did not see her come out of her room which is what alarmed him.  She was also noted to be incontinent of urine.  No other signs of trauma were otherwise noted.  Of note, patient has had significant weakness since October of last year according to the daughter, but refuses to use cane or walker.  She has also been noted to have ongoing upper respiratory symptoms with no diagnosis in the last several months after having seen 5 different providers.  She appears to have recently had TEE on 6/23 with plans for valve replacement soon per cardiology.   Clinical Impression  Patient limited for functional mobility as stated below secondary to BLE weakness, fatigue and impaired standing balance. Patient transitions to seated EOB without assist. She demonstrates good sitting balance and sitting tolerance EOB. She requires min assist to transfer to standing without AD and is unsteady upon standing. She ambulates to chair at bedside and is limited by weakness and fatigue. Patient transfers to standing after rest break with RW and demonstrates fair standing tolerance with RW but is limited by fatigue requesting to sit. Patient ends session seated in chair - RN aware. Patient will benefit from continued physical therapy in  hospital and recommended venue below to increase strength, balance, endurance for safe ADLs and gait.     Follow Up Recommendations SNF;Supervision/Assistance - 24 hour;Supervision for mobility/OOB    Equipment Recommendations  None recommended by PT    Recommendations for Other Services       Precautions / Restrictions Precautions Precautions: Fall Restrictions Weight Bearing Restrictions: No      Mobility  Bed Mobility Overal bed mobility: Modified Independent             General bed mobility comments: slow, labored transition to EOB without assist    Transfers Overall transfer level: Needs assistance Equipment used: None;Rolling walker (2 wheeled) Transfers: Sit to/from [facility] Sit to Stand: Min assist Stand pivot transfers: Min assist       General transfer comment: transfer to standing from bed without AD, unsteady upon standing stating LE weakness  Ambulation/Gait Ambulation/Gait assistance: Min assist Gait Distance (Feet): 3 Feet Assistive device: None Gait Pattern/deviations: Decreased stride length Gait velocity: decreased   General Gait Details: unsteady cadence to chair at bedside, reaching for chair for support  Stairs            Wheelchair Mobility    Modified Rankin (Stroke Patients Only)       Balance Overall balance assessment: Needs assistance Sitting-balance support: No upper extremity supported;Feet supported Sitting balance-Leahy Scale: Good Sitting balance - Comments: seated EOB   Standing balance support: No upper extremity supported Standing balance-Leahy Scale: Poor Standing balance comment: fair/poor without AD  Pertinent Vitals/Pain Pain Assessment: No/denies pain    Home Living Family/patient expects to be discharged to:: Private residence Living Arrangements: Children Available Help at Discharge: Family Type of Home: House Home Access: Stairs to  enter Entrance Stairs-Rails: Right Entrance Stairs-Number of Steps: 4-5 Home Layout: One level Home Equipment: Cane - single point;Shower seat      Prior Function Level of Independence: Needs assistance   Gait / Transfers Assistance Needed: household ambulator without AD  ADL's / Homemaking Assistance Needed: family assists prn        Hand Dominance        Extremity/Trunk Assessment   Upper Extremity Assessment Upper Extremity Assessment: Overall WFL for tasks assessed    Lower Extremity Assessment Lower Extremity Assessment: Generalized weakness    Cervical / Trunk Assessment Cervical / Trunk Assessment: Normal  Communication   Communication: No difficulties  Cognition Arousal/Alertness: Awake/alert Behavior During Therapy: WFL for tasks assessed/performed Overall Cognitive Status: Within Functional Limits for tasks assessed                                        General Comments      Exercises     Assessment/Plan    PT Assessment Patient needs continued PT services  PT Problem List Decreased strength;Decreased mobility;Decreased activity tolerance;Decreased balance;Decreased knowledge of use of DME       PT Treatment Interventions DME instruction;Therapeutic exercise;Gait training;Balance training;Stair training;Neuromuscular re-education;Functional mobility training;Therapeutic activities;Patient/family education    PT Goals (Current goals can be found in the Care Plan section)  Acute Rehab PT Goals Patient Stated Goal: Return home PT Goal Formulation: With patient Time For Goal Achievement: 12/05/20 Potential to Achieve Goals: Good    Frequency Min 3X/week   Barriers to discharge        Co-evaluation               AM-PAC PT "6 Clicks" Mobility  Outcome Measure Help needed turning from your back to your side while in a flat bed without using bedrails?: None Help needed moving from lying on your back to sitting on the  side of a flat bed without using bedrails?: None Help needed moving to and from a bed to a chair (including a wheelchair)?: A Little Help needed standing up from a chair using your arms (e.g., wheelchair or bedside chair)?: A Little Help needed to walk in hospital room?: A Lot Help needed climbing 3-5 steps with a railing? : Total 6 Click Score: 17    End of Session Equipment Utilized During Treatment: Gait belt Activity Tolerance: Patient limited by fatigue Patient left: in chair;with call bell/phone within reach;with chair alarm set Nurse Communication: Mobility status PT Visit Diagnosis: Unsteadiness on feet (R26.81);Other abnormalities of gait and mobility (R26.89);Muscle weakness (generalized) (M62.81)    Time: 1937-9024 PT Time Calculation (min) (ACUTE ONLY): 22 min   Charges:   PT Evaluation $PT Eval Low Complexity: 1 Low PT Treatments $Therapeutic Activity: 8-22 mins        9:01 AM, 11/21/20 Mearl Latin PT, DPT Physical Therapist at [facility]

## 2020-11-21 NOTE — Evaluation (Addendum)
Occupational Therapy Evaluation Patient Details Name: Denise Page MRN: [id number] DOB: 12/04/1940 Today's Date: 11/21/2020    History of Present Illness Denise Page is a 80 y.o. female with medical history significant for anemia, aortic stenosis, severe mitral regurgitation, hypothyroidism, dyslipidemia, and hypertension who was brought to the ED via EMS after her son found her down on the floor in her bedroom at approximately 1030 this morning.  She was more or less unresponsive and noted to be pale and appeared to have a left-sided facial droop.  She was last seen and known well at approximately 8 PM last night.  According to the daughter, she normally wakes up earlier in the morning, but the son did not see her come out of her room which is what alarmed him.  She was also noted to be incontinent of urine.  No other signs of trauma were otherwise noted.  Of note, patient has had significant weakness since October of last year according to the daughter, but refuses to use cane or walker.  She has also been noted to have ongoing upper respiratory symptoms with no diagnosis in the last several months after having seen 5 different providers.  She appears to have recently had TEE on 6/23 with plans for valve replacement soon per cardiology.   Clinical Impression   Pt agreeable to OT evaluation. Pt appeared confused at times asking 2+ times what happened to her to arrive in the hospital. Pt reported conflicting home set up between OT and PT per documentation review. Unsure of pt's baseline cognitive status or support at home. Pt demonstrates need for Min G assist for mobility. One noted instance of LOB walking to sink without AD. Able to groom at the sink with Min G assist. Pt also reports impaired light touch sensation to L UE. General B UE weakness with more noted weakness in L UE than right for elbow extension. Pt demonstrates possible visual deficits in the areas of saccades, pursuits, and  convergence, but cognitive deficits may be impacting performance in those areas. Pt will benefit from continued OT in the hospital and recommended venue below to increase strength, balance, and endurance for safe ADL's.    Follow Up Recommendations  SNF;Supervision/Assistance - 24 hour    Equipment Recommendations  None recommended by OT           Precautions / Restrictions Precautions Precautions: Fall Restrictions Weight Bearing Restrictions: No      Mobility Bed Mobility Overal bed mobility: Modified Independent             General bed mobility comments: slow, labored transition to EOB without assist    Transfers Overall transfer level: Needs assistance Equipment used: None Transfers: Sit to/from [facility] Sit to Stand: Min guard Stand pivot transfers: Min guard       General transfer comment: Pt unsteady at times. One instance of loss of balance when ambulating to sink from chair.    Balance Overall balance assessment: Needs assistance Sitting-balance support: No upper extremity supported;Feet supported Sitting balance-Leahy Scale: Good Sitting balance - Comments: seated EOB   Standing balance support: No upper extremity supported Standing balance-Leahy Scale: Poor Standing balance comment: fair/poor without AD                           ADL either performed or assessed with clinical judgement   ADL Overall ADL's : Needs assistance/impaired     Grooming: Oral care;Standing;Min guard;Supervision/safety Grooming Details (  indicate cue type and reason): Standing at sink with SPV to Min G assist             Lower Body Dressing: Supervision/safety;Modified independent;Sitting/lateral leans Lower Body Dressing Details (indicate cue type and reason): doffing and donning socks seated in chair Toilet Transfer: Min Designer, jewellery Details (indicate cue type and reason): Simulated via chair to EOB transfer  without AD                 Vision Baseline Vision/History: Wears glasses Wears Glasses: Reading only (Wore reading glasses at times in the past) Patient Visual Report: No change from baseline Vision Assessment?: Yes Eye Alignment: Within Functional Limits Tracking/Visual Pursuits: Decreased smoothness of eye movement to RIGHT superior field Saccades: Additional head turns occurred during testing Convergence: Impaired (comment) (R eye able to converge with L eye drifting laterally.) Visual Fields: Other (comment) (Pt kept turning head during testing. Possible deficits or difficulty following directions. Noted difficulty dissociating head and eye movements.)                Pertinent Vitals/Pain Pain Assessment: No/denies pain     Hand Dominance Left   Extremity/Trunk Assessment Upper Extremity Assessment Upper Extremity Assessment: LUE deficits/detail;Generalized weakness;RUE deficits/detail RUE Deficits / Details: 3+/5 MMT shoulder, 4-/5 elbow flexion, ext, and grip. RUE Sensation: WNL RUE Coordination: WNL LUE Deficits / Details: 3+/5 MMT shoulder, 4-/5 elbow flexion and grip; 3+/5 for L elbow extension. Pt reported decreased sensation to light touch on L UE. LUE Sensation: decreased light touch LUE Coordination: WNL   Lower Extremity Assessment Lower Extremity Assessment: Defer to PT evaluation   Cervical / Trunk Assessment Cervical / Trunk Assessment: Normal   Communication Communication Communication: No difficulties   Cognition Arousal/Alertness: Awake/alert Behavior During Therapy: WFL for tasks assessed/performed Overall Cognitive Status: No family/caregiver present to determine baseline cognitive functioning                                 General Comments: Pt seemed confused asking multiple times what happened. Oriented to place but year or president.                    Home Living Family/patient expects to be discharged to::  Private residence Living Arrangements: Children Available Help at Discharge: Family Type of Home: House Home Access: Stairs to enter Technical brewer of Steps: 4-5 Entrance Stairs-Rails: Right;Left;Can reach both (Pt reported bilateral rails) Home Layout: One level     Bathroom Shower/Tub: Tub/shower unit (Pt reported tub/shower to this therapist and walk in to PT.)   Bathroom Toilet: Standard Bathroom Accessibility: Yes   Home Equipment: Cane - single point;Shower seat (Pt did not report a shower seat as she did with PT.)   Additional Comments: Unable to verify pt home living history. Noted to have conflicting reports in set up between OT and PT.  Lives With: Son (Pt reported also living with her mother; stated that her son works.)    Prior Functioning/Environment Level of Independence: Needs Product/process development scientist / Transfers Assistance Needed: household ambulator without AD ADL's / Homemaking Assistance Needed: Pt reported indepednent ADL's and assist from family for IADL's            OT Problem List: Decreased strength;Impaired balance (sitting and/or standing);Impaired vision/perception;Decreased safety awareness;Decreased cognition      OT Treatment/Interventions: Self-care/ADL training;Therapeutic exercise;Therapeutic activities;DME and/or AE instruction;Neuromuscular education;Visual/perceptual remediation/compensation;Patient/family education;Balance training  OT Goals(Current goals can be found in the care plan section) Acute Rehab OT Goals Patient Stated Goal: Return home OT Goal Formulation: With patient Time For Goal Achievement: 12/05/20 Potential to Achieve Goals: Good  OT Frequency: Min 2X/week    End of Session    Activity Tolerance:   Patient left:    OT Visit Diagnosis: Unsteadiness on feet (R26.81);History of falling (Z91.81);Other symptoms and signs involving the nervous system (R29.898);Other symptoms and signs involving cognitive function                 Time: 3343-5686 OT Time Calculation (min): 30 min Charges:  OT General Charges $OT Visit: 1 Visit OT Evaluation $OT Eval Low Complexity: 1 Low  Sharee Sturdy OT, MOT   Larey Seat 11/21/2020, 10:41 AM

## 2020-11-22 LAB — BASIC METABOLIC PANEL
Anion gap: 10 (ref 5–15)
BUN: 11 mg/dL (ref 8–23)
CO2: 23 mmol/L (ref 22–32)
Calcium: 9 mg/dL (ref 8.9–10.3)
Chloride: 106 mmol/L (ref 98–111)
Creatinine, Ser: 0.85 mg/dL (ref 0.44–1.00)
GFR, Estimated: >60 mL/min (ref >60)
Glucose, Bld: 107 mg/dL — ABNORMAL HIGH (ref 70–99)
Potassium: 3.5 mmol/L (ref 3.5–5.1)
Sodium: 139 mmol/L (ref 135–145)

## 2020-11-22 LAB — CBC
HCT: 34.4 % — ABNORMAL LOW (ref 36.0–46.0)
Hemoglobin: 11.5 g/dL — ABNORMAL LOW (ref 12.0–15.0)
MCH: 30.3 pg (ref 26.0–34.0)
MCHC: 33.4 g/dL (ref 30.0–36.0)
MCV: 90.5 fL (ref 80.0–100.0)
Platelets: 130 10*3/uL — ABNORMAL LOW (ref 150–400)
RBC: 3.8 MIL/uL — ABNORMAL LOW (ref 3.87–5.11)
RDW: 14.5 % (ref 11.5–15.5)
WBC: 3.4 10*3/uL — ABNORMAL LOW (ref 4.0–10.5)
nRBC: 0 % (ref 0.0–0.2)

## 2020-11-22 LAB — MAGNESIUM: Magnesium: 2.1 mg/dL (ref 1.7–2.4)

## 2020-11-22 LAB — HEMOGLOBIN A1C
Hgb A1c MFr Bld: 5.5 % (ref 4.8–5.6)
Mean Plasma Glucose: 111 mg/dL

## 2020-11-22 MED ORDER — HALOPERIDOL LACTATE 5 MG/ML IJ SOLN: 2.0000 mg | Freq: Four times a day (QID) | INTRAMUSCULAR | Status: DC | PRN

## 2020-11-22 NOTE — Discharge Summary (Signed)
Physician Discharge Summary  Kerith Sherley YIR:[id number] DOB: 20-Apr-1941 DOA: 11/20/2020  PCP: Rosalee Kaufman, PA-C  Admit date: 11/20/2020  Discharge date: 11/22/2020  Admitted From:Home  Disposition:  Home  Recommendations for Outpatient Follow-up:  Follow up with PCP in 1-2 weeks Please follow-up with cardiologist Dr. Percival Spanish with referral sent.  Patient states that she would like to see her cardiologist before considering the need for any Holter monitoring placement and currently declines Holter monitoring to assess for any silent arrhythmias.  Also follow-up with cardiology regarding need for mitral valve replacement. Continue home medications as prior  Home Health: None, patient declines  Equipment/Devices: None  Discharge Condition:Stable  CODE STATUS: Full  Diet recommendation: Heart Healthy  Brief/Interim Summary:  Denise Page is a 80 y.o. female with medical history significant for anemia, aortic stenosis, severe mitral regurgitation, hypothyroidism, dyslipidemia, and hypertension who was brought to the ED via EMS after her son found her down on the floor in her bedroom at approximately 1030 on the morning of admission.  She was thought to have a possible TIA versus CVA, however her brain MRI has returned negative.  EEG has also returned without any epileptiform activity.  PT has recommended rehab placement, but patient wants to go home and family members also want to take the risk of bringing her home.  She also declines home health PT and RN as well as Holter monitoring.  She would like to see her cardiologist in the outpatient setting prior to considering the need for any cardiac monitoring.  She has done well and has otherwise been at her baseline from a mentation standpoint during the course of this hospitalization.  It appears that she may have had a likely syncopal episode at home.  This could have been due to her severe valvular abnormalities versus an  arrhythmia which was not detected on telemetry during the course of this hospitalization.  Discharge Diagnoses:  Active Problems:   Acute encephalopathy  Principal discharge diagnosis: Acute transient encephalopathy possibly related to syncopal episode at home.  Discharge Instructions  Discharge Instructions     Ambulatory referral to Cardiology   Complete by: As directed    Diet - low sodium heart healthy   Complete by: As directed    Increase activity slowly   Complete by: As directed       Allergies as of 11/22/2020       Reactions   Morphine And Related Anaphylaxis   Sulfa Antibiotics Anaphylaxis   Codeine Nausea And Vomiting   Tramadol Nausea And Vomiting        Medication List     TAKE these medications    acetaminophen 650 MG CR tablet Commonly known as: TYLENOL Take 650 mg by mouth every 8 (eight) hours as needed for pain.   ALPRAZolam 0.5 MG tablet Commonly known as: XANAX Take 0.5 mg by mouth 2 (two) times daily.   aspirin EC 81 MG tablet Take 81 mg by mouth in the morning. Swallow whole.   calcitonin (salmon) 200 UNIT/ACT nasal spray Commonly known as: MIACALCIN/FORTICAL Place 1 spray into alternate nostrils daily as needed (pain).   carvedilol 6.25 MG tablet Commonly known as: COREG TAKE 1 TABLET BY MOUTH TWICE DAILY with a meal   escitalopram 5 MG tablet Commonly known as: LEXAPRO Take 5 mg by mouth at bedtime.   ferrous sulfate 325 (65 FE) MG tablet Take 325 mg by mouth in the morning and at bedtime.   folic acid 1 MG tablet  Commonly known as: FOLVITE Take 1 mg by mouth in the morning.   levothyroxine 25 MCG tablet Commonly known as: SYNTHROID Take 25 mcg by mouth every morning.   lisinopril 5 MG tablet Commonly known as: ZESTRIL TAKE 1 TABLET BY MOUTH EVERY DAY   pantoprazole 40 MG tablet Commonly known as: PROTONIX Take 40 mg by mouth in the morning.   rosuvastatin 5 MG tablet Commonly known as: CRESTOR TAKE 1 TABLET BY  MOUTH EVERY DAY   sucralfate 1 g tablet Commonly known as: CARAFATE Take 1 g by mouth 2 (two) times daily.   vitamin B-12 1000 MCG tablet Commonly known as: CYANOCOBALAMIN Take 1,000 mcg by mouth in the morning.        Follow-up Information     Rosalee Kaufman, PA-C. Schedule an appointment as soon as possible for a visit in 1 week(s).   Specialty: Physician Assistant Contact information: [place] [address] [phone]         Minus Breeding, MD. Schedule an appointment as soon as possible for a visit in 2 week(s).   Specialty: Cardiology Contact information: [place] [address] [phone]                Allergies  Allergen Reactions   Morphine And Related Anaphylaxis   Sulfa Antibiotics Anaphylaxis   Codeine Nausea And Vomiting   Tramadol Nausea And Vomiting    Consultations: None   Procedures/Studies: CT Angio Head W or Wo Contrast  Result Date: 11/20/2020 CLINICAL DATA:  Stroke/TIA.  Found down. EXAM: CT HEAD WITHOUT CONTRAST CT ANGIOGRAPHY OF THE HEAD AND NECK TECHNIQUE: Contiguous axial images were obtained from the base of the skull through the vertex without intravenous contrast. Multidetector CT imaging of the head and neck was performed using the standard protocol during bolus administration of intravenous contrast. Multiplanar CT image reconstructions and MIPs were obtained to evaluate the vascular anatomy. Carotid stenosis measurements (when applicable) are obtained utilizing NASCET criteria, using the distal internal carotid diameter as the denominator. CONTRAST:  69mL OMNIPAQUE IOHEXOL 350 MG/ML SOLN COMPARISON:  None. FINDINGS: CT HEAD Brain: No evidence of acute large vascular territory infarction, hemorrhage, hydrocephalus, extra-axial collection or mass lesion/mass effect. Partially empty sella. Vascular: See below. Skull: No acute fracture. Visualized sinuses are largely clear. No acute orbital  findings. Sinuses: Visualized sinuses are largely clear. No acute orbital findings. Other: No mastoid effusions. CTA NECK Aortic arch: Great vessel origins are patent. Right carotid system: Moderate calcific and noncalcific atherosclerosis at the carotid bifurcation without greater than 50% narrowing. Left carotid system: Mild atherosclerosis at the carotid bifurcation without greater than 50% stenosis. Vertebral arteries:Mild left dominant. Patent without greater than 50% stenosis. Skeleton: Please see concurrent CT cervical spine for further evaluation of the spine. Other neck: Enlarged and heterogeneous thyroid gland. This has been evaluated on previous imaging. (ref: J Am Coll Radiol. 2015 Feb;12(2): 143-50). CTA HEAD Motion limited evaluation. Anterior circulation: Calcific atherosclerosis of the right greater than left cavernous and paraclinoid ICAs without evidence of greater than 50% narrowing. No evidence of large vessel occlusion or proximal flow limiting stenosis. Limited evaluation of the distal MCAs and ACAs due to patient motion and venous timing. Posterior circulation: Small vertebrobasilar system without evidence of a large vessel occlusion are flow limiting proximal stenosis. Left posterior communicating artery. Venous sinuses: No evidence of dural venous sinus thrombosis. IMPRESSION: CT head: No evidence of acute large vascular territory infarct or acute hemorrhage. CTA  head: 1. No large vessel occlusion or flow limiting proximal stenosis. Limited evaluation distally due to patient motion and venous contamination. 2. Small vertebrobasilar system. CTA neck: 1. No evidence of significant (greater than 50%) stenosis. 2. Left greater than right carotid bifurcation atherosclerosis. 3. Please see concurrent CT of the cervical spine for evaluation of the cervical spine. Electronically Signed   By: Margaretha Sheffield MD   On: 11/20/2020 13:18   DG Chest 2 View  Result Date: 11/20/2020 CLINICAL DATA:   Fall. EXAM: CHEST - 2 VIEW COMPARISON:  March 19, 2020. FINDINGS: Stable cardiomegaly. No pneumothorax or pleural effusion is noted. Mild central pulmonary vascular may be present with possible bibasilar atelectasis or edema. No acute bony abnormality is noted. IMPRESSION: Stable cardiomegaly with mild central pulmonary vascular congestion. Possible bibasilar subsegmental atelectasis or edema is noted. Electronically Signed   By: Marijo Conception M.D.   On: 11/20/2020 13:43   DG Pelvis 1-2 Views  Result Date: 11/20/2020 CLINICAL DATA:  Status post fall. EXAM: PELVIS - 1-2 VIEW COMPARISON:  None. FINDINGS: There is no evidence of pelvic fracture or diastasis. No pelvic bone lesions are seen. Contrast in the ureters and bladder from the patient's CT angiogram of the head and neck today noted. IMPRESSION: No acute abnormality. Electronically Signed   By: Inge Rise M.D.   On: 11/20/2020 13:44   CT Angio Neck W and/or Wo Contrast  Result Date: 11/20/2020 CLINICAL DATA:  Stroke/TIA.  Found down. EXAM: CT HEAD WITHOUT CONTRAST CT ANGIOGRAPHY OF THE HEAD AND NECK TECHNIQUE: Contiguous axial images were obtained from the base of the skull through the vertex without intravenous contrast. Multidetector CT imaging of the head and neck was performed using the standard protocol during bolus administration of intravenous contrast. Multiplanar CT image reconstructions and MIPs were obtained to evaluate the vascular anatomy. Carotid stenosis measurements (when applicable) are obtained utilizing NASCET criteria, using the distal internal carotid diameter as the denominator. CONTRAST:  1mL OMNIPAQUE IOHEXOL 350 MG/ML SOLN COMPARISON:  None. FINDINGS: CT HEAD Brain: No evidence of acute large vascular territory infarction, hemorrhage, hydrocephalus, extra-axial collection or mass lesion/mass effect. Partially empty sella. Vascular: See below. Skull: No acute fracture. Visualized sinuses are largely clear. No acute  orbital findings. Sinuses: Visualized sinuses are largely clear. No acute orbital findings. Other: No mastoid effusions. CTA NECK Aortic arch: Great vessel origins are patent. Right carotid system: Moderate calcific and noncalcific atherosclerosis at the carotid bifurcation without greater than 50% narrowing. Left carotid system: Mild atherosclerosis at the carotid bifurcation without greater than 50% stenosis. Vertebral arteries:Mild left dominant. Patent without greater than 50% stenosis. Skeleton: Please see concurrent CT cervical spine for further evaluation of the spine. Other neck: Enlarged and heterogeneous thyroid gland. This has been evaluated on previous imaging. (ref: J Am Coll Radiol. 2015 Feb;12(2): 143-50). CTA HEAD Motion limited evaluation. Anterior circulation: Calcific atherosclerosis of the right greater than left cavernous and paraclinoid ICAs without evidence of greater than 50% narrowing. No evidence of large vessel occlusion or proximal flow limiting stenosis. Limited evaluation of the distal MCAs and ACAs due to patient motion and venous timing. Posterior circulation: Small vertebrobasilar system without evidence of a large vessel occlusion are flow limiting proximal stenosis. Left posterior communicating artery. Venous sinuses: No evidence of dural venous sinus thrombosis. IMPRESSION: CT head: No evidence of acute large vascular territory infarct or acute hemorrhage. CTA head: 1. No large vessel occlusion or flow limiting proximal stenosis. Limited evaluation distally due to  patient motion and venous contamination. 2. Small vertebrobasilar system. CTA neck: 1. No evidence of significant (greater than 50%) stenosis. 2. Left greater than right carotid bifurcation atherosclerosis. 3. Please see concurrent CT of the cervical spine for evaluation of the cervical spine. Electronically Signed   By: Margaretha Sheffield MD   On: 11/20/2020 13:18   MR BRAIN WO CONTRAST  Result Date:  11/20/2020 CLINICAL DATA:  Mental status change EXAM: MRI HEAD WITHOUT CONTRAST TECHNIQUE: Multiplanar, multiecho pulse sequences of the brain and surrounding structures were obtained without intravenous contrast. COMPARISON:  CT head 11/20/2020 FINDINGS: Brain: Motion degraded study Negative for acute infarct. Negative for hemorrhage or mass. Ventricle size normal. Mild white matter FLAIR hyperintensity bilaterally. Brainstem intact. Vascular: Normal arterial flow voids Skull and upper cervical spine: Negative Sinuses/Orbits: Mild mucosal edema paranasal sinuses. Negative orbit Other: None IMPRESSION: No acute abnormality.  Motion degraded study Electronically Signed   By: Franchot Gallo M.D.   On: 11/20/2020 19:03   US Carotid Bilateral (at [facility] and [facility] only)  Result Date: 11/20/2020 CLINICAL DATA:  CVA. Altered mental status. History of CAD, hypertension and hyperlipidemia. EXAM: BILATERAL CAROTID DUPLEX ULTRASOUND TECHNIQUE: Pearline Cables scale imaging, color Doppler and duplex ultrasound were performed of bilateral carotid and vertebral arteries in the neck. COMPARISON:  Carotid Doppler ultrasound - 03/20/2020; thyroid ultrasound - 03/20/2020 FINDINGS: Criteria: Quantification of carotid stenosis is based on velocity parameters that correlate the residual internal carotid diameter with NASCET-based stenosis levels, using the diameter of the distal internal carotid lumen as the denominator for stenosis measurement. The following velocity measurements were obtained: RIGHT ICA: 146/40 cm/sec CCA: 46/27 cm/sec SYSTOLIC ICA/CCA RATIO:  1.7 ECA: 66 cm/sec LEFT ICA: 102/29 cm/sec CCA: 03/50 cm/sec SYSTOLIC ICA/CCA RATIO:  1.1 ECA: 94 cm/sec RIGHT CAROTID ARTERY: There is a moderate amount of eccentric echogenic plaque within the right carotid bulb (image 15 and 17), extending to involve the origin and proximal aspects the right internal carotid artery (image 25), morphologically similar to the 02/2020 examination resulting  in mildly elevated peak systolic velocities within the mid and distal aspects of the right internal carotid artery though velocity measurements may be artifactually elevated due to vessel tortuosity (representative image 29). Greatest acquired peak systolic velocity within distal aspect the right internal carotid artery measures 146 centimeters/second (image 33), previously, greatest acquired peak systolic velocity within mid right ICA measured 171 centimeters/second. RIGHT VERTEBRAL ARTERY:  Antegrade Flow LEFT CAROTID ARTERY: There is a minimal amount of eccentric echogenic plaque involving the origin and proximal aspects of the left internal carotid artery (image 64), similar to the 02/2020 examination and again not resulting in elevated peak systolic velocities within the interrogated course the left internal carotid artery to suggest a hemodynamically significant stenosis. Mildly elevated peak systolic velocity within distal aspect the left internal carotid artery is felt to be factitious slightly elevated due to sampling at a location of turbulent flow at the periphery of the vessel. LEFT VERTEBRAL ARTERY:  Antegrade flow Marked heterogeneity of the incidentally imaged thyroid (images 37 through 41). IMPRESSION: 1. Moderate amount of right-sided atherosclerotic plaque, similar to the 10/21 examination, again resulting in elevated peak systolic velocities within the right internal carotid artery compatible with the 50-69% luminal narrowing range, though velocity measurements may be artifactually elevated due to vessel tortuosity. Further evaluation with CTA could be performed as clinically indicated. 2. Minimal amount of left-sided atherosclerotic plaque, similar to the 02/2020 examination and again not resulting in a hemodynamically significant stenosis. 3.  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 Electronically Signed   By: Sandi Mariscal M.D.   On: 11/20/2020 16:51   CT C-SPINE NO CHARGE  Result Date: 11/20/2020 CLINICAL DATA:  Found on floor.  Probable fall. EXAM: CT CERVICAL SPINE WITHOUT CONTRAST TECHNIQUE: Multidetector CT imaging of the cervical spine was performed without intravenous contrast. Multiplanar CT image reconstructions were also generated. COMPARISON:  CT angio head and neck 11/20/2020 FINDINGS: Alignment: Mild anterolisthesis C5-6 Skull base and vertebrae: Negative for fracture or mass lesion. Soft tissues and spinal canal: Mild thyromegaly. No soft tissue mass in the neck. Disc levels: Mild disc degeneration and spurring most prominent C5-6 and C6-7. No significant spinal stenosis. Upper chest: Lung apices clear bilaterally. Other: None IMPRESSION: Negative for cervical fracture. Electronically Signed   By: Franchot Gallo M.D.   On: 11/20/2020 13:20   DG Knee Complete 4 Views Right  Result Date: 11/20/2020 CLINICAL DATA:  Right knee pain after fall. EXAM: RIGHT KNEE - COMPLETE 4+ VIEW COMPARISON:  None. FINDINGS: No evidence of fracture, dislocation, or joint effusion. Moderate to severe narrowing of medial joint space is noted. Soft tissues are unremarkable. 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 No acute abnormality seen. Electronically Signed   By: Marijo Conception M.D.   On: 11/20/2020 13:44   EEG adult  Result Date: 11/21/2020 Lora Havens, MD     11/21/2020  6:23 PM Patient Name: Denise Page MRN: [id number] Epilepsy Attending: Lora Havens Referring Physician/Provider: Dr. Heath Lark Date: 11/21/2020 Duration: 23.33 mins Patient history: 80 year old female with altered mental status.  EEG to evaluate for seizures. Level of alertness: Awake AEDs during EEG study: None Technical aspects: This EEG study was done with scalp electrodes positioned according to the 10-20 International system of electrode  placement. Electrical activity was acquired at a sampling rate of 500Hz  and reviewed with a high frequency filter of 70Hz  and a low frequency filter of 1Hz . EEG data were recorded continuously and digitally stored. Description: No posterior dominant rhythm was seen. EEG showed continuous generalized and maximal bitemporal 5 to 6 Hz theta as well as intermittent 2-3hz  delta slowing, at times with triphasic morphology. Hyperventilation and photic stimulation were not performed.   ABNORMALITY - Continuous slow, generalized and maximal bitemporal IMPRESSION: This study is suggestive of cortical dysfunction arising from bitemporal region, nonspecific etiology. There is also moderate diffuse encephalopathy, nonspecific etiology but could be secondary to toxic-metabolic causes.  No seizures or definite epileptiform discharges were seen throughout the recording.Lora Havens   ECHO TEE  Result Date: 11/13/2020    TRANSESOPHOGEAL ECHO REPORT   Patient Name:   EMILIA KAYES Date of Exam: 11/13/2020 Medical Rec #:  [id number]        Height:       68.0 in Accession #:    [id number]       Weight:       173.0 lb Date of Birth:  Sep 24, 1940         BSA:          1.922 m Patient Age:    80 years         BP:           155/76 mmHg Patient Gender: F                HR:  66 bpm. Exam Location:  Outpatient Procedure: Transesophageal Echo, 3D Echo, Cardiac Doppler and Color Doppler Indications:     Nonrheumatic aortic (valve) stenosis I35.0  History:         Patient has prior history of Echocardiogram examinations, most                  recent 10/22/2020. Previous Myocardial Infarction, Carotid                  Disease; Risk Factors:Dyslipidemia and Hypertension. Anemia.  Sonographer:     Darlina Sicilian RDCS Referring Phys:  [place] Diagnosing Phys: Jenkins Rouge MD PROCEDURE: After discussion of the risks and benefits of a TEE, an informed consent was obtained from the patient. The transesophogeal probe was  passed without difficulty through the esophogus of the patient. Imaged were obtained with the patient in a left lateral decubitus position. Local oropharyngeal anesthetic was provided with Benzocaine spray. Sedation performed by different physician. The patient was monitored while under deep sedation. Anesthestetic sedation was provided intravenously by Anesthesiology: 235.5mg  of Propofol. Image quality was good. The patient's vital signs; including heart rate, blood pressure, and oxygen saturation; remained stable throughout the procedure. The patient developed no complications during the procedure. IMPRESSIONS  1. Paitent will likely need complex multi valve surgery involving TV ring, AVR and mitral valve repair.  2. Left ventricular ejection fraction, by estimation, is 55 to 60%. The left ventricle has normal function. There is mild left ventricular hypertrophy.  3. Right ventricular systolic function is normal. The right ventricular size is normal.  4. Left atrial size was severely dilated. No left atrial/left atrial appendage thrombus was detected.  5. Right atrial size was moderately dilated.  6. 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 The mitral valve is abnormal. Severe mitral valve regurgitation.  7. Tricuspid valve regurgitation is moderate.  8. The aortic valve is tricuspid. There is moderate calcification of the aortic valve. There is moderate thickening of the aortic valve. Aortic valve regurgitation is mild. Moderate to severe aortic valve stenosis. FINDINGS  Left Ventricle: Left ventricular ejection fraction, by estimation, is 55 to 60%. The left ventricle has normal function. The left ventricular internal cavity size was normal in size. There is mild left ventricular hypertrophy. Right Ventricle: The right ventricular size is normal. No increase in right ventricular wall thickness. Right ventricular systolic function is normal. Left Atrium: Left atrial size was severely  dilated. No left atrial/left atrial appendage thrombus was detected. Right Atrium: Right atrial size was moderately dilated. Pericardium: There is no evidence of pericardial effusion. 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 The mitral valve is abnormal. Severe mitral valve regurgitation. Tricuspid Valve: The tricuspid valve is normal in structure. Tricuspid valve regurgitation is moderate. Aortic Valve: The aortic valve is tricuspid. There is moderate calcification of the aortic valve. There is moderate thickening of the aortic valve. There is moderate aortic valve annular calcification. Aortic valve regurgitation is mild. Moderate to severe aortic stenosis is present. Aortic valve mean gradient measures 20.0 mmHg. Aortic valve peak gradient measures 35.5 mmHg. Aortic valve area, by VTI measures 0.68 cm. Pulmonic Valve: The pulmonic valve was normal in structure. Pulmonic valve regurgitation is mild. Aorta: The aortic root is normal in size and structure. IAS/Shunts: No atrial level shunt detected by color flow Doppler. Additional Comments: Paitent will likely need complex multi valve surgery involving TV ring, AVR and mitral valve repair.  LEFT VENTRICLE PLAX 2D LVOT diam:     2.40 cm LV SV:         48 LV SV Index:   25 LVOT Area:     4.52 cm  AORTIC VALVE AV Area (Vmax):    0.89 cm AV Area (Vmean):   0.81 cm AV Area (VTI):     0.68 cm AV Vmax:           298.00 cm/s AV Vmean:          204.000 cm/s AV VTI:            0.699 m AV Peak Grad:      35.5 mmHg AV Mean Grad:      20.0 mmHg LVOT Vmax:         58.80 cm/s LVOT Vmean:        36.400 cm/s LVOT VTI:          0.105 m LVOT/AV VTI ratio: 0.15 MR Peak grad:    147.9 mmHg MR Mean grad:    96.0 mmHg   SHUNTS MR Vmax:         608.00 cm/s Systemic VTI:  0.10 m MR Vmean:        463.0 cm/s  Systemic Diam: 2.40 cm MR PISA:         1.90 cm MR PISA Eff ROA: 10 mm MR PISA Radius:  0.55 cm Jenkins Rouge MD Electronically signed by  Jenkins Rouge MD Signature Date/Time: 11/13/2020/12:57:53 PM    Final      Discharge Exam: Vitals:   11/21/20 2051 11/22/20 0532  BP: (!) 139/59 (!) 147/66  Pulse: 74 63  Resp: 18 15  Temp: 99.6 F (37.6 C) 98.8 F (37.1 C)  SpO2: 95% 97%   Vitals:   11/21/20 1300 11/21/20 1822 11/21/20 2051 11/22/20 0532  BP: (!) 135/48 (!) 110/21 (!) 139/59 (!) 147/66  Pulse: 67 80 74 63  Resp: 19 20 18 15   Temp: 99.7 F (37.6 C)  99.6 F (37.6 C) 98.8 F (37.1 C)  TempSrc: Oral  Oral Oral  SpO2: 98% 98% 95% 97%  Weight:      Height:        General: Pt is alert, awake, not in acute distress Cardiovascular: RRR, S1/S2 +, no rubs, no gallops Respiratory: CTA bilaterally, no wheezing, no rhonchi Abdominal: Soft, NT, ND, bowel sounds + Extremities: no edema, no cyanosis    The results of significant diagnostics from this hospitalization (including imaging, microbiology, ancillary and laboratory) are listed below for reference.     Microbiology: Recent Results (from the past 240 hour(s))  Resp Panel by RT-PCR (Flu A&B, Covid) Nasopharyngeal Swab     Status: None   Collection Time: 11/20/20  2:59 PM   Specimen: Nasopharyngeal Swab; Nasopharyngeal(NP) swabs in vial transport medium  Result Value Ref Range Status   SARS Coronavirus 2 by RT PCR NEGATIVE NEGATIVE Final    Comment: (NOTE) SARS-CoV-2 target nucleic acids are NOT DETECTED.  The SARS-CoV-2 RNA is generally detectable in upper respiratory specimens during the acute phase of infection. The lowest concentration of SARS-CoV-2 viral copies this assay can detect is 138 copies/mL. A negative result does not preclude SARS-Cov-2 infection and should not be used as the sole basis for treatment or other patient management decisions. A negative result may occur with  improper specimen collection/handling, submission of specimen other than nasopharyngeal swab, presence of viral mutation(s) within the areas targeted by this assay, and  inadequate number  of viral copies(<138 copies/mL). A negative result must be combined with clinical observations, patient history, and epidemiological information. The expected result is Negative.  Fact Sheet for Patients:  EntrepreneurPulse.com.au  Fact Sheet for Healthcare Providers:  IncredibleEmployment.be  This test is no t yet approved or cleared by the [place] FDA and  has been authorized for detection and/or diagnosis of SARS-CoV-2 by FDA under an Emergency Use Authorization (EUA). This EUA will remain  in effect (meaning this test can be used) for the duration of the COVID-19 declaration under Section 564(b)(1) of the Act, 21 U.S.C.section 360bbb-3(b)(1), unless the authorization is terminated  or revoked sooner.       Influenza A by PCR NEGATIVE NEGATIVE Final   Influenza B by PCR NEGATIVE NEGATIVE Final    Comment: (NOTE) The Xpert Xpress SARS-CoV-2/FLU/RSV plus assay is intended as an aid in the diagnosis of influenza from Nasopharyngeal swab specimens and should not be used as a sole basis for treatment. Nasal washings and aspirates are unacceptable for Xpert Xpress SARS-CoV-2/FLU/RSV testing.  Fact Sheet for Patients: EntrepreneurPulse.com.au  Fact Sheet for Healthcare Providers: IncredibleEmployment.be  This test is not yet approved or cleared by the [place] FDA and has been authorized for detection and/or diagnosis of SARS-CoV-2 by FDA under an Emergency Use Authorization (EUA). This EUA will remain in effect (meaning this test can be used) for the duration of the COVID-19 declaration under Section 564(b)(1) of the Act, 21 U.S.C. section 360bbb-3(b)(1), unless the authorization is terminated or revoked.  Performed at [facility], [address], [place], [place] [address]      Labs: BNP (last 3 results) No results for input(s): BNP in the last 8760 hours. Basic  Metabolic Panel: Recent Labs  Lab 11/20/20 1141 11/20/20 1147 11/20/20 1601 11/22/20 0700  NA 134* 138  --  139  K 4.1 4.3  --  3.5  CL 101 101  --  106  CO2 28  --   --  23  GLUCOSE 118* 113*  --  107*  BUN 19 18  --  11  CREATININE 1.08* 1.10* 0.91 0.85  CALCIUM 8.5*  --   --  9.0  MG  --   --   --  2.1   Liver Function Tests: Recent Labs  Lab 11/20/20 1141  AST 22  ALT 13  ALKPHOS 48  BILITOT 0.6  PROT 6.8  ALBUMIN 4.2   No results for input(s): LIPASE, AMYLASE in the last 168 hours. Recent Labs  Lab 11/20/20 1157  AMMONIA 26   CBC: Recent Labs  Lab 11/20/20 1141 11/20/20 1147 11/20/20 1601 11/22/20 0700  WBC 4.6  --  4.2 3.4*  NEUTROABS 2.7  --   --   --   HGB 11.3* 11.6* 11.4* 11.5*  HCT 34.3* 34.0* 34.4* 34.4*  MCV 92.0  --  91.0 90.5  PLT 133*  --  114* 130*   Cardiac Enzymes: Recent Labs  Lab 11/20/20 1141  CKTOTAL 361*   BNP: Invalid input(s): POCBNP CBG: No results for input(s): GLUCAP in the last 168 hours. D-Dimer No results for input(s): DDIMER in the last 72 hours. Hgb A1c Recent Labs    11/21/20 0525  HGBA1C 5.5   Lipid Profile Recent Labs    11/21/20 0525  CHOL 130  HDL 49  LDLCALC 71  TRIG 50  CHOLHDL 2.7   Thyroid function studies Recent Labs    11/20/20 1141  TSH 3.533   Anemia work up No  results for input(s): VITAMINB12, FOLATE, FERRITIN, TIBC, IRON, RETICCTPCT in the last 72 hours. Urinalysis    Component Value Date/Time   COLORURINE YELLOW 11/20/2020 1141   APPEARANCEUR CLEAR 11/20/2020 1141   LABSPEC 1.015 11/20/2020 1141   PHURINE 5.0 11/20/2020 1141   GLUCOSEU NEGATIVE 11/20/2020 1141   HGBUR SMALL (A) 11/20/2020 1141   BILIRUBINUR NEGATIVE 11/20/2020 1141   KETONESUR NEGATIVE 11/20/2020 1141   PROTEINUR NEGATIVE 11/20/2020 1141   NITRITE NEGATIVE 11/20/2020 1141   LEUKOCYTESUR NEGATIVE 11/20/2020 1141   Sepsis Labs Invalid input(s): PROCALCITONIN,  WBC,  LACTICIDVEN Microbiology Recent  Results (from the past 240 hour(s))  Resp Panel by RT-PCR (Flu A&B, Covid) Nasopharyngeal Swab     Status: None   Collection Time: 11/20/20  2:59 PM   Specimen: Nasopharyngeal Swab; Nasopharyngeal(NP) swabs in vial transport medium  Result Value Ref Range Status   SARS Coronavirus 2 by RT PCR NEGATIVE NEGATIVE Final    Comment: (NOTE) SARS-CoV-2 target nucleic acids are NOT DETECTED.  The SARS-CoV-2 RNA is generally detectable in upper respiratory specimens during the acute phase of infection. The lowest concentration of SARS-CoV-2 viral copies this assay can detect is 138 copies/mL. A negative result does not preclude SARS-Cov-2 infection and should not be used as the sole basis for treatment or other patient management decisions. A negative result may occur with  improper specimen collection/handling, submission of specimen other than nasopharyngeal swab, presence of viral mutation(s) within the areas targeted by this assay, and inadequate number of viral copies(<138 copies/mL). A negative result must be combined with clinical observations, patient history, and epidemiological information. The expected result is Negative.  Fact Sheet for Patients:  EntrepreneurPulse.com.au  Fact Sheet for Healthcare Providers:  IncredibleEmployment.be  This test is no t yet approved or cleared by the [place] FDA and  has been authorized for detection and/or diagnosis of SARS-CoV-2 by FDA under an Emergency Use Authorization (EUA). This EUA will remain  in effect (meaning this test can be used) for the duration of the COVID-19 declaration under Section 564(b)(1) of the Act, 21 U.S.C.section 360bbb-3(b)(1), unless the authorization is terminated  or revoked sooner.       Influenza A by PCR NEGATIVE NEGATIVE Final   Influenza B by PCR NEGATIVE NEGATIVE Final    Comment: (NOTE) The Xpert Xpress SARS-CoV-2/FLU/RSV plus assay is intended as an aid in the  diagnosis of influenza from Nasopharyngeal swab specimens and should not be used as a sole basis for treatment. Nasal washings and aspirates are unacceptable for Xpert Xpress SARS-CoV-2/FLU/RSV testing.  Fact Sheet for Patients: EntrepreneurPulse.com.au  Fact Sheet for Healthcare Providers: IncredibleEmployment.be  This test is not yet approved or cleared by the [place] FDA and has been authorized for detection and/or diagnosis of SARS-CoV-2 by FDA under an Emergency Use Authorization (EUA). This EUA will remain in effect (meaning this test can be used) for the duration of the COVID-19 declaration under Section 564(b)(1) of the Act, 21 U.S.C. section 360bbb-3(b)(1), unless the authorization is terminated or revoked.  Performed at [facility], [address], [place], [place] [address]      Time coordinating discharge: 35 minutes  SIGNED:   Rodena Goldmann, DO [facility] 11/22/2020, 9:26 AM  If 7PM-7AM, please contact night-coverage [email or web address]

## 2020-11-22 NOTE — Progress Notes (Signed)
Pt walked down the hall trying to leave via elevator, Nurse stopped and redirected her back to pts room. Pt is agitated and anxious about leaving. Pt has been redirected several times and educated about safety and not getting out of chair alone. Pt pulled tele leads off and refuses to allow [place] to put them back on. MD notified.  Nurse called daughter via phone to update her. Pts mother said to be on the way to the hospital.

## 2020-11-22 NOTE — Progress Notes (Signed)
Nsg Discharge Note  Admit Date:  11/20/2020 Discharge date: 11/22/2020   Favor Kreh to be D/C'd Home per MD order.  AVS completed.  Copy for chart, and copy for patient signed, and dated. Patient/caregiver able to verbalize understanding.  Discharge Medication: Allergies as of 11/22/2020       Reactions   Morphine And Related Anaphylaxis   Sulfa Antibiotics Anaphylaxis   Codeine Nausea And Vomiting   Tramadol Nausea And Vomiting        Medication List     TAKE these medications    acetaminophen 650 MG CR tablet Commonly known as: TYLENOL Take 650 mg by mouth every 8 (eight) hours as needed for pain.   ALPRAZolam 0.5 MG tablet Commonly known as: XANAX Take 0.5 mg by mouth 2 (two) times daily.   aspirin EC 81 MG tablet Take 81 mg by mouth in the morning. Swallow whole.   calcitonin (salmon) 200 UNIT/ACT nasal spray Commonly known as: MIACALCIN/FORTICAL Place 1 spray into alternate nostrils daily as needed (pain).   carvedilol 6.25 MG tablet Commonly known as: COREG TAKE 1 TABLET BY MOUTH TWICE DAILY with a meal   escitalopram 5 MG tablet Commonly known as: LEXAPRO Take 5 mg by mouth at bedtime.   ferrous sulfate 325 (65 FE) MG tablet Take 325 mg by mouth in the morning and at bedtime.   folic acid 1 MG tablet Commonly known as: FOLVITE Take 1 mg by mouth in the morning.   levothyroxine 25 MCG tablet Commonly known as: SYNTHROID Take 25 mcg by mouth every morning.   lisinopril 5 MG tablet Commonly known as: ZESTRIL TAKE 1 TABLET BY MOUTH EVERY DAY   pantoprazole 40 MG tablet Commonly known as: PROTONIX Take 40 mg by mouth in the morning.   rosuvastatin 5 MG tablet Commonly known as: CRESTOR TAKE 1 TABLET BY MOUTH EVERY DAY   sucralfate 1 g tablet Commonly known as: CARAFATE Take 1 g by mouth 2 (two) times daily.   vitamin B-12 1000 MCG tablet Commonly known as: CYANOCOBALAMIN Take 1,000 mcg by mouth in the morning.        Discharge  Assessment: Vitals:   11/21/20 2051 11/22/20 0532  BP: (!) 139/59 (!) 147/66  Pulse: 74 63  Resp: 18 15  Temp: 99.6 F (37.6 C) 98.8 F (37.1 C)  SpO2: 95% 97%   Skin clean, dry and intact without evidence of skin break down, no evidence of skin tears noted. IV catheter discontinued intact. Site without signs and symptoms of complications - no redness or edema noted at insertion site, patient denies c/o pain - only slight tenderness at site.  Dressing with slight pressure applied.  D/c Instructions-Education: Discharge instructions given to patient/family with verbalized understanding. D/c education completed with patient/family including follow up instructions, medication list, d/c activities limitations if indicated, with other d/c instructions as indicated by MD - patient able to verbalize understanding, all questions fully answered. Patient instructed to return to ED, call 911, or call MD for any changes in condition.  Patient escorted via [place], and D/C home via private auto.  Clovis Fredrickson, LPN 06/27/8248 0:37 AM

## 2020-11-25 ENCOUNTER — Telehealth: Payer: Self-pay | Admitting: Cardiology

## 2020-11-25 NOTE — Telephone Encounter (Signed)
Patient's daughter called and said that patient was admitted into hospital on 11/20/20. Found patient at son's house on the floor. Patient was breathing was wasn't really coherent. Hospital ruled out stroke and seizure. Patient is adamant that she will not see the doctor. Daughter said that she ran halls all night. Has not be diagnosed with dementia. Daughter says that she wanted to let Dr. Percival Spanish and nurse know and wants to keep appt for now if he change needs to happen then please let her know

## 2021-01-06 NOTE — Progress Notes (Signed)
Cardiology Office Note   Date:  01/07/2021   ID:  Denise Page, DOB May 27, 1940, MRN [id number]  PCP:  Rosalee Kaufman, PA-C  Cardiologist:   Minus Breeding, MD Referring:  Rosalee Kaufman, PA-C  Chief Complaint  Patient presents with   Valvular Heart Disease        History of Present Illness: Denise Page is a 80 y.o. female who is referred by Rosalee Kaufman, PA-C for evaluation of aortic stenosis.  Echo in Dec last year at [facility] demonstrated left atrial dilatation.  EF was 55%.  There was moderate to severe aortic stenosis with mean gradient of 27 mm Hg.  She had chest pain and had a negative perfusion study in 2021.  I sent her for a TEE after the last visit.  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  There was moderate TR.  She had moderate to severe AS.  She was to come back to see me about this but she was in the hospital in June with AMS.  I reviewed these records for this visit.    She did had an MRI without acute changes and EEG without seizures.    She returns to discuss her valve disease.  She is with her daughter.  Patient is under the impression that if she were to have surgery she would not survive it.  Her daughter wants her to understand more about the options.  The patient has comorbidities to include anemia.  In fact when I first saw her she had been at [facility] with anemia.  I was able to see the work-up of that from March she was found to have hiatal hernia with erythematous mucosa that was thought to be losing blood.  She did have some colonic polyps.  It was said that she would just have to have periodic follow-up with possibly transfusions but her hemoglobin has been stable.  I also see notes from a hematologist who saw her for pancytopenia.  This was mild and stable but they had suggested a bone marrow which he rejected.  But that is back when she returns.  She lives by herself.  She does  some minor housework such as going down to feed the dog once in a while or hanging some close on the line.  Her daughter said however she is relatively sedentary.  She does not use walker or cane.  She is not having any acute complaints such as chest pressure, neck or arm discomfort.  He is having no new shortness of breath, PND or orthopnea.  He has had some nosebleeds infrequently and some bruising.  Although she and her daughter do not report a baseline memory disorder she does not remember conversations we had previously.  She clearly had delirium when she was hospitalized with her bleeding per her daughter and then she had the episode of altered mental status of her baseline neurologic function is questionable but again she lives independently.  Past Medical History:  Diagnosis Date   Anemia    Aortic stenosis    Bilateral carotid artery disease ([place])    Enlarged thyroid    Hyperlipidemia    Hypertension    NSTEMI (non-ST elevated myocardial infarction) [facility])     Past Surgical History:  Procedure Laterality Date   BIOPSY  08/01/2020   Procedure: BIOPSY;  Surgeon: Ronnette Juniper, MD;  Location: [facility] ENDOSCOPY;  Service: Gastroenterology;;  EGD and COLON  CHOLECYSTECTOMY     COLONOSCOPY N/A 08/01/2020   Procedure: COLONOSCOPY;  Surgeon: Ronnette Juniper, MD;  Location: [facility] ENDOSCOPY;  Service: Gastroenterology;  Laterality: N/A;   CYSTECTOMY     ESOPHAGOGASTRODUODENOSCOPY (EGD) WITH PROPOFOL N/A 08/01/2020   Procedure: ESOPHAGOGASTRODUODENOSCOPY (EGD) WITH PROPOFOL;  Surgeon: Ronnette Juniper, MD;  Location: [facility] ENDOSCOPY;  Service: Gastroenterology;  Laterality: N/A;   POLYPECTOMY  08/01/2020   Procedure: POLYPECTOMY;  Surgeon: Ronnette Juniper, MD;  Location: [facility] ENDOSCOPY;  Service: Gastroenterology;;   TEE WITHOUT CARDIOVERSION N/A 11/13/2020   Procedure: TRANSESOPHAGEAL ECHOCARDIOGRAM (TEE);  Surgeon: Josue Hector, MD;  Location: [facility] ENDOSCOPY;  Service: Cardiovascular;  Laterality: N/A;   TONSILLECTOMY        Current Outpatient Medications  Medication Sig Dispense Refill   acetaminophen (TYLENOL) 650 MG CR tablet Take 650 mg by mouth every 8 (eight) hours as needed for pain.     ALPRAZolam (XANAX) 0.5 MG tablet Take 0.5 mg by mouth 2 (two) times daily.     aspirin EC 81 MG tablet Take 81 mg by mouth in the morning. Swallow whole.     calcitonin, salmon, (MIACALCIN/FORTICAL) 200 UNIT/ACT nasal spray Place 1 spray into alternate nostrils daily as needed (pain).     carvedilol (COREG) 6.25 MG tablet TAKE 1 TABLET BY MOUTH TWICE DAILY with a meal 180 tablet 1   escitalopram (LEXAPRO) 5 MG tablet Take 5 mg by mouth at bedtime.     ferrous sulfate 325 (65 FE) MG tablet Take 325 mg by mouth in the morning and at bedtime.     folic acid (FOLVITE) 1 MG tablet Take 1 mg by mouth in the morning.     levothyroxine (SYNTHROID) 25 MCG tablet Take 25 mcg by mouth every morning.     lisinopril (ZESTRIL) 5 MG tablet TAKE 1 TABLET BY MOUTH EVERY DAY 90 tablet 1   pantoprazole (PROTONIX) 40 MG tablet Take 40 mg by mouth 2 (two) times daily.     rosuvastatin (CRESTOR) 5 MG tablet TAKE 1 TABLET BY MOUTH EVERY DAY 90 tablet 1   sucralfate (CARAFATE) 1 g tablet Take 1 g by mouth 2 (two) times daily.     vitamin B-12 (CYANOCOBALAMIN) 1000 MCG tablet Take 1,000 mcg by mouth in the morning.     No current facility-administered medications for this visit.    Allergies:   Morphine and related, Sulfa antibiotics, Codeine, and Tramadol   ROS:  Please see the history of present illness.   Otherwise, review of systems are positive for none.   All other systems are reviewed and negative.    PHYSICAL EXAM: VS:  BP 120/72   Pulse 80   Ht '5\' 7"'$  (1.702 m)   Wt 167 lb (75.8 kg)   BMI 26.16 kg/m  , BMI Body mass index is 26.16 kg/m. GENERAL:  Well appearing.  She looks her stated age but not frail. NECK:  No jugular venous distention, waveform within normal limits, carotid upstroke brisk and symmetric, no bruits, no  thyromegaly LUNGS:  Clear to auscultation bilaterally CHEST:  Unremarkable HEART:  PMI not displaced or sustained,S1 and S2 within normal limits, no S3, no S4, no clicks, no rubs, 3 out of 6 holosystolic murmur at the apex and radiating to the axilla and also slightly at the aortic outflow tract, no diastolic murmurs ABD:  Flat, positive bowel sounds normal in frequency in pitch, no bruits, no rebound, no guarding, no midline pulsatile mass, no hepatomegaly, no splenomegaly EXT:  2  plus pulses throughout, no edema, no cyanosis no clubbing   TEE:     1. Paitent will likely need complex multi valve surgery involving TV  ring, AVR and mitral valve repair.   2. Left ventricular ejection fraction, by estimation, is 55 to 60%. The  left ventricle has normal function. There is mild left ventricular  hypertrophy.   3. Right ventricular systolic function is normal. The right ventricular  size is normal.   4. Left atrial size was severely dilated. No left atrial/left atrial  appendage thrombus was detected.   5. Right atrial size was moderately dilated.   6. 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 The  mitral valve is abnormal. Severe mitral valve regurgitation.   7. Tricuspid valve regurgitation is moderate.   8. The aortic valve is tricuspid. There is moderate calcification of the  aortic valve. There is moderate thickening of the aortic valve. Aortic  valve regurgitation is mild. Moderate to severe aortic valve stenosis.    EKG:  EKG is not ordered today.    Recent Labs: 11/20/2020: ALT 13; TSH 3.533 11/22/2020: BUN 11; Creatinine, Ser 0.85; Hemoglobin 11.5; Magnesium 2.1; Platelets 130; Potassium 3.5; Sodium 139    Lipid Panel    Component Value Date/Time   CHOL 130 11/21/2020 0525   TRIG 50 11/21/2020 0525   HDL 49 11/21/2020 0525   CHOLHDL 2.7 11/21/2020 0525   VLDL 10 11/21/2020 0525   LDLCALC 71 11/21/2020 0525      Wt Readings from Last 3  Encounters:  01/07/21 167 lb (75.8 kg)  11/20/20 171 lb (77.6 kg)  11/13/20 173 lb (78.5 kg)      Other studies Reviewed: Additional studies/ records that were reviewed today include: Hospital records.   (Greater than 40 minutes reviewing all data with greater than 50% face to face with the patient). Review of the above records demonstrates:  Please see elsewhere in the note.     ASSESSMENT AND PLAN:  AORTIC STENOSIS:   We had a long discussion today about her multiple valve issues.  She certainly would be at higher risk for multivalve surgery particularly noted.  She has and she is not sure she would want to do that but she does agree to have consultation which I would arrange at [facility].  For now she will continue the meds as listed.   PULMONARY HTN:   This can be followed clinically and pending her decision about.   MR:   This will need surgical repair as above.  No change in therapy at this time.   ANEMIA:    She has seen hematology.  She has seen GI.  Her primary care is following her hemoglobin and will transfuse as needed.   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  This was in June and she will need follow up Doppler next year.    Current medicines are reviewed at length with the patient today.  The patient does not have concerns regarding medicines.    The following changes have been made:  None  Labs/ tests ordered today include:  None  No orders of the defined types were placed in this encounter.     Disposition:   FU with me in 6 months or sooner if she decides to pursue medical management only   Signed, Minus Breeding, MD  01/07/2021 2:06 PM    [facility]

## 2021-01-07 ENCOUNTER — Ambulatory Visit (INDEPENDENT_AMBULATORY_CARE_PROVIDER_SITE_OTHER): Payer: Medicare Other | Admitting: Cardiology

## 2021-01-07 ENCOUNTER — Other Ambulatory Visit: Payer: Self-pay

## 2021-01-07 ENCOUNTER — Encounter: Payer: Self-pay | Admitting: Cardiology

## 2021-01-07 VITALS — BP 120/72 | HR 80 | Ht 67.0 in | Wt 167.0 lb

## 2021-01-07 DIAGNOSIS — I350 Nonrheumatic aortic (valve) stenosis: Secondary | ICD-10-CM | POA: Diagnosis not present

## 2021-01-07 DIAGNOSIS — I639 Cerebral infarction, unspecified: Secondary | ICD-10-CM

## 2021-01-07 DIAGNOSIS — I340 Nonrheumatic mitral (valve) insufficiency: Secondary | ICD-10-CM

## 2021-01-07 DIAGNOSIS — I361 Nonrheumatic tricuspid (valve) insufficiency: Secondary | ICD-10-CM | POA: Diagnosis not present

## 2021-01-07 NOTE — Patient Instructions (Signed)
Medication Instructions:  The current medical regimen is effective;  continue present plan and medications.  *If you need a refill on your cardiac medications before your next appointment, please call your pharmacy*  Follow-Up: At [facility], you and your health needs are our priority.  As part of our continuing mission to provide you with exceptional heart care, we have created designated Provider Care Teams.  These Care Teams include your primary Cardiologist (physician) and Advanced Practice Providers (APPs -  Physician Assistants and Nurse Practitioners) who all work together to provide you with the care you need, when you need it.  We recommend signing up for the patient portal called "MyChart".  Sign up information is provided on this After Visit Summary.  MyChart is used to connect with patients for Virtual Visits (Telemedicine).  Patients are able to view lab/test results, encounter notes, upcoming appointments, etc.  Non-urgent messages can be sent to your provider as well.   To learn more about what you can do with MyChart, go to [email or web address].    Your next appointment:   6 month(s)  The format for your next appointment:   In Person  Provider:   James Hochrein, MD   Thank you for choosing [facility]!!     

## 2021-01-19 NOTE — Progress Notes (Signed)
Denise Page NOTE  Patient Care Team: Rosine Door as PCP - General (Physician Assistant) Minus Breeding, MD as PCP - Cardiology (Cardiology)  CHIEF COMPLAINTS/PURPOSE OF CONSULTATION:  Pancytopenia.  ASSESSMENT & PLAN:  Other pancytopenia ([place]) Patient is a very pleasant 80 year old female patient with past medical history significant for hypertension, non-STEMI, aortic stenosis referred to hematology for evaluation of pancytopenia.  During her initial visit we added some blood work which suggested presence of hypothyroidism.  She was started on levothyroxine supplementation, iron and B12 supplementation. She is here for FU. She is doing better, energy levels are better. Physical examination today, unremarkable, no palpable lymphadenopathy, baseline bilateral lower extremity swelling noted, no asymmetry.  I recommended that she proceed with CBC, iron panel, ferritin, B12 and TSH levels today. Labs today without any significant change.  She is also reluctant to do any aggressive procedures such as a bone marrow aspiration and biopsy. We will continue monitoring on a periodic basis. RTC in 4 months. She should continue all her supplementation until otherwise advised. Thank you for consulting [place] in the care of this patient.  Please not hesitate to contact us with any additional questions or concerns  Orders Placed This Encounter  Procedures   CBC with Differential/Platelet    Standing Status:   Standing    Number of Occurrences:   22    Standing Expiration Date:   01/20/2022   TSH    Standing Status:   Standing    Number of Occurrences:   22    Standing Expiration Date:   01/20/2022   Vitamin B12    Standing Status:   Future    Number of Occurrences:   1    Standing Expiration Date:   01/20/2022   Iron and TIBC    Standing Status:   Future    Number of Occurrences:   1    Standing Expiration Date:   01/20/2022   Ferritin    Standing Status:   Future     Number of Occurrences:   1    Standing Expiration Date:   01/20/2022     HISTORY OF PRESENTING ILLNESS:   Denise Page 80 y.o. female is here because of pancytopenia.  This is a very pleasant 80 yr old female patient with PMH significant for HTN, dyslipidemia, bilateral carotid disease, AS, NSTEMI referred to hematology for evaluation of pancytopenia.     Interval history Patient is here for a follow-up with her daughter.  Since her last visit, she continues on iron supplementation, recently started levothyroxine and B12 supplementation about 2 weeks ago. Since her last visit, she has been doing well according to her daughter, eating better, energy levels are better.  Daughter feels like she could have a conversation with her mom now.  She is compliant with all her medications.  No interim infections or hospitalizations.  She is recommended to meet a cardiothoracic surgeon at [facility] for a possible valve replacement.  She is understandably nervous about the surgery. Rest of the pertinent 10 point ROS reviewed and negative.  MEDICAL HISTORY:  Past Medical History:  Diagnosis Date   Anemia    Aortic stenosis    Bilateral carotid artery disease (HCC)    Enlarged thyroid    Hyperlipidemia    Hypertension    NSTEMI (non-ST elevated myocardial infarction) [facility])     SURGICAL HISTORY: Past Surgical History:  Procedure Laterality Date   BIOPSY  08/01/2020   Procedure: BIOPSY;  Surgeon: Ronnette Juniper, MD;  Location: [facility];  Service: Gastroenterology;;  EGD and COLON   CHOLECYSTECTOMY     COLONOSCOPY N/A 08/01/2020   Procedure: COLONOSCOPY;  Surgeon: Ronnette Juniper, MD;  Location: [facility] ENDOSCOPY;  Service: Gastroenterology;  Laterality: N/A;   CYSTECTOMY     ESOPHAGOGASTRODUODENOSCOPY (EGD) WITH PROPOFOL N/A 08/01/2020   Procedure: ESOPHAGOGASTRODUODENOSCOPY (EGD) WITH PROPOFOL;  Surgeon: Ronnette Juniper, MD;  Location: [facility] ENDOSCOPY;  Service: Gastroenterology;  Laterality: N/A;    POLYPECTOMY  08/01/2020   Procedure: POLYPECTOMY;  Surgeon: Ronnette Juniper, MD;  Location: [facility] ENDOSCOPY;  Service: Gastroenterology;;   TEE WITHOUT CARDIOVERSION N/A 11/13/2020   Procedure: TRANSESOPHAGEAL ECHOCARDIOGRAM (TEE);  Surgeon: Josue Hector, MD;  Location: [facility] ENDOSCOPY;  Service: Cardiovascular;  Laterality: N/A;   TONSILLECTOMY      SOCIAL HISTORY: Social History   Socioeconomic History   Marital status: Single    Spouse name: Not on file   Number of children: Not on file   Years of education: Not on file   Highest education level: Not on file  Occupational History   Not on file  Tobacco Use   Smoking status: Never   Smokeless tobacco: Never  Substance and Sexual Activity   Alcohol use: Not on file   Drug use: Not on file   Sexual activity: Not on file  Other Topics Concern   Not on file  Social History Narrative   Not on file   Social Determinants of Health   Financial Resource Strain: Not on file  Food Insecurity: Not on file  Transportation Needs: Not on file  Physical Activity: Not on file  Stress: Not on file  Social Connections: Not on file  Intimate Partner Violence: Not on file    FAMILY HISTORY: Family History  Problem Relation Age of Onset   Throat cancer Sister    Prostate cancer Brother     ALLERGIES:  is allergic to morphine and related, sulfa antibiotics, codeine, and tramadol.  MEDICATIONS:  Current Outpatient Medications  Medication Sig Dispense Refill   acetaminophen (TYLENOL) 650 MG CR tablet Take 650 mg by mouth every 8 (eight) hours as needed for pain.     ALPRAZolam (XANAX) 0.5 MG tablet Take 0.5 mg by mouth 2 (two) times daily.     aspirin EC 81 MG tablet Take 81 mg by mouth in the morning. Swallow whole.     calcitonin, salmon, (MIACALCIN/FORTICAL) 200 UNIT/ACT nasal spray Place 1 spray into alternate nostrils daily as needed (pain).     carvedilol (COREG) 6.25 MG tablet TAKE 1 TABLET BY MOUTH TWICE DAILY with a meal 180 tablet  1   escitalopram (LEXAPRO) 5 MG tablet Take 5 mg by mouth at bedtime.     ferrous sulfate 325 (65 FE) MG tablet Take 325 mg by mouth in the morning and at bedtime.     folic acid (FOLVITE) 1 MG tablet Take 1 mg by mouth in the morning.     levothyroxine (SYNTHROID) 25 MCG tablet Take 25 mcg by mouth every morning.     lisinopril (ZESTRIL) 5 MG tablet TAKE 1 TABLET BY MOUTH EVERY DAY 90 tablet 1   pantoprazole (PROTONIX) 40 MG tablet Take 40 mg by mouth 2 (two) times daily.     rosuvastatin (CRESTOR) 5 MG tablet TAKE 1 TABLET BY MOUTH EVERY DAY 90 tablet 1   sucralfate (CARAFATE) 1 g tablet Take 1 g by mouth 2 (two) times daily.     vitamin B-12 (CYANOCOBALAMIN) 1000  MCG tablet Take 1,000 mcg by mouth in the morning.     No current facility-administered medications for this visit.   PHYSICAL EXAMINATION: ECOG PERFORMANCE STATUS: 0 - Asymptomatic  Vitals:   01/20/21 1152  BP: (!) 152/70  Pulse: 70  Resp: 18  Temp: 97.9 F (36.6 C)  SpO2: 99%   Filed Weights   01/20/21 1152  Weight: 168 lb 11.2 oz (76.5 kg)    Physical Exam Constitutional:      Appearance: Normal appearance. She is normal weight.  HENT:     Head: Normocephalic and atraumatic.  Cardiovascular:     Rate and Rhythm: Normal rate and regular rhythm.     Pulses: Normal pulses.     Heart sounds: Normal heart sounds.  Pulmonary:     Effort: Pulmonary effort is normal.     Breath sounds: Normal breath sounds.  Abdominal:     General: Abdomen is flat. Bowel sounds are normal.     Palpations: Abdomen is soft.  Musculoskeletal:        General: Swelling (Bilateral lower extremity swelling, symmetrical and 1+.) present. No tenderness.  Skin:    General: Skin is warm and dry.  Neurological:     General: No focal deficit present.     Mental Status: She is alert.  Psychiatric:        Mood and Affect: Mood normal.        Behavior: Behavior normal.     LABORATORY DATA:  I have reviewed the data as listed Lab  Results  Component Value Date   WBC 3.0 (L) 01/20/2021   HGB 11.5 (L) 01/20/2021   HCT 35.1 (L) 01/20/2021   MCV 90.2 01/20/2021   PLT 148 (L) 01/20/2021     Chemistry      Component Value Date/Time   NA 139 11/22/2020 0700   K 3.5 11/22/2020 0700   CL 106 11/22/2020 0700   CO2 23 11/22/2020 0700   BUN 11 11/22/2020 0700   CREATININE 0.85 11/22/2020 0700   CREATININE 1.26 (H) 10/21/2020 1517      Component Value Date/Time   CALCIUM 9.0 11/22/2020 0700   ALKPHOS 48 11/20/2020 1141   AST 22 11/20/2020 1141   AST 13 (L) 10/21/2020 1517   ALT 13 11/20/2020 1141   ALT 11 10/21/2020 1517   BILITOT 0.6 11/20/2020 1141   BILITOT 0.9 10/21/2020 1517     I have reviewed labs. CBC repeated today WBC 3K, Hb 11.5, platelet count 148K. Ferritin 48, TSH normal.  RADIOGRAPHIC STUDIES: I have personally reviewed the radiological images as listed and agreed with the findings in the report. No results found.  All questions were answered. The patient knows to call the clinic with any problems, questions or concerns. I spent 20 minutes in the care of this patient including H and P, review of records, counseling and coordination of care. Repeat labs ordered.    Benay Pike, MD 01/20/2021 2:35 PM

## 2021-01-19 NOTE — Assessment & Plan Note (Addendum)
Patient is a very pleasant 79 year old female patient with past medical history significant for hypertension, non-STEMI, aortic stenosis referred to hematology for evaluation of pancytopenia.  During her initial visit we added some blood work which suggested presence of hypothyroidism.  She was started on levothyroxine supplementation, iron and B12 supplementation. She is here for FU. She is doing better, energy levels are better. Physical examination today, unremarkable, no palpable lymphadenopathy, baseline bilateral lower extremity swelling noted, no asymmetry.  I recommended that she proceed with CBC, iron panel, ferritin, B12 and TSH levels today. Labs today without any significant change.  She is also reluctant to do any aggressive procedures such as a bone marrow aspiration and biopsy. We will continue monitoring on a periodic basis. RTC in 4 months. She should continue all her supplementation until otherwise advised. Thank you for consulting [place] in the care of this patient.  Please not hesitate to contact us with any additional questions or concerns

## 2021-01-20 ENCOUNTER — Encounter: Payer: Self-pay | Admitting: Hematology and Oncology

## 2021-01-20 ENCOUNTER — Other Ambulatory Visit: Payer: Self-pay

## 2021-01-20 ENCOUNTER — Inpatient Hospital Stay: Payer: Medicare Other

## 2021-01-20 ENCOUNTER — Inpatient Hospital Stay: Payer: Medicare Other | Attending: Hematology and Oncology | Admitting: Hematology and Oncology

## 2021-01-20 VITALS — BP 152/70 | HR 70 | Temp 97.9°F | Resp 18 | Ht 67.0 in | Wt 168.7 lb

## 2021-01-20 DIAGNOSIS — E538 Deficiency of other specified B group vitamins: Secondary | ICD-10-CM

## 2021-01-20 DIAGNOSIS — D61818 Other pancytopenia: Secondary | ICD-10-CM | POA: Insufficient documentation

## 2021-01-20 DIAGNOSIS — Z79890 Hormone replacement therapy (postmenopausal): Secondary | ICD-10-CM | POA: Diagnosis not present

## 2021-01-20 DIAGNOSIS — I350 Nonrheumatic aortic (valve) stenosis: Secondary | ICD-10-CM | POA: Diagnosis not present

## 2021-01-20 DIAGNOSIS — E785 Hyperlipidemia, unspecified: Secondary | ICD-10-CM | POA: Diagnosis not present

## 2021-01-20 DIAGNOSIS — D509 Iron deficiency anemia, unspecified: Secondary | ICD-10-CM | POA: Diagnosis not present

## 2021-01-20 DIAGNOSIS — E039 Hypothyroidism, unspecified: Secondary | ICD-10-CM | POA: Diagnosis not present

## 2021-01-20 DIAGNOSIS — D649 Anemia, unspecified: Secondary | ICD-10-CM | POA: Diagnosis not present

## 2021-01-20 DIAGNOSIS — I10 Essential (primary) hypertension: Secondary | ICD-10-CM | POA: Insufficient documentation

## 2021-01-20 DIAGNOSIS — I252 Old myocardial infarction: Secondary | ICD-10-CM | POA: Diagnosis not present

## 2021-01-20 DIAGNOSIS — M7989 Other specified soft tissue disorders: Secondary | ICD-10-CM | POA: Diagnosis not present

## 2021-01-20 LAB — CBC WITH DIFFERENTIAL/PLATELET
Abs Immature Granulocytes: 0.02 10*3/uL (ref 0.00–0.07)
Basophils Absolute: 0 10*3/uL (ref 0.0–0.1)
Basophils Relative: 0 %
Eosinophils Absolute: 0 10*3/uL (ref 0.0–0.5)
Eosinophils Relative: 1 %
HCT: 35.1 % — ABNORMAL LOW (ref 36.0–46.0)
Hemoglobin: 11.5 g/dL — ABNORMAL LOW (ref 12.0–15.0)
Immature Granulocytes: 1 %
Lymphocytes Relative: 37 %
Lymphs Abs: 1.1 10*3/uL (ref 0.7–4.0)
MCH: 29.6 pg (ref 26.0–34.0)
MCHC: 32.8 g/dL (ref 30.0–36.0)
MCV: 90.2 fL (ref 80.0–100.0)
Monocytes Absolute: 0.7 10*3/uL (ref 0.1–1.0)
Monocytes Relative: 23 %
Neutro Abs: 1.2 10*3/uL — ABNORMAL LOW (ref 1.7–7.7)
Neutrophils Relative %: 38 %
Platelets: 148 10*3/uL — ABNORMAL LOW (ref 150–400)
RBC: 3.89 MIL/uL (ref 3.87–5.11)
RDW: 13.6 % (ref 11.5–15.5)
WBC: 3 10*3/uL — ABNORMAL LOW (ref 4.0–10.5)
nRBC: 0 % (ref 0.0–0.2)

## 2021-01-20 LAB — IRON AND TIBC
Iron: 54 ug/dL (ref 41–142)
Saturation Ratios: 17 % — ABNORMAL LOW (ref 21–57)
TIBC: 311 ug/dL (ref 236–444)
UIBC: 258 ug/dL (ref 120–384)

## 2021-01-20 LAB — TSH: TSH: 1.152 u[IU]/mL (ref 0.308–3.960)

## 2021-01-20 LAB — VITAMIN B12: Vitamin B-12: 596 pg/mL (ref 180–914)

## 2021-01-20 LAB — T4, FREE: Free T4: 0.81 ng/dL (ref 0.61–1.12)

## 2021-01-20 LAB — FERRITIN: Ferritin: 48 ng/mL (ref 11–307)

## 2021-01-22 ENCOUNTER — Telehealth: Payer: Self-pay | Admitting: *Deleted

## 2021-01-22 NOTE — Telephone Encounter (Signed)
-----   Message from Eileen Stanford, PA-C sent at 01/22/2021  3:13 PM EDT ----- Oh that is great to know. Will forward to Valetta Fuller and Lauren!  ----- Message ----- From: Cristopher Estimable, RN Sent: 01/22/2021   2:08 PM EDT To: Minus Breeding, MD, Eileen Stanford, PA-C  Just FYI I called Dr Aundra Millet office and all we have to do to refer the patient is send the records, there is no form to fill out. Also, vanessa can share all the echo's and TEE's with them and we do not have to burn a CD. : )  ----- Message ----- From: Eileen Stanford, PA-C Sent: 01/19/2021   3:53 PM EDT To: Minus Breeding, MD, Cristopher Estimable, RN  Have your nurse fill out the [facility] referral form Lenice Llamas just googled it and emailed it to you). You will have to have copies of all her imaging burned onto CDs and FedEx'd in a envelope with all the printed records and referral form. TOTAL PAIN. Valetta Fuller is sending you and Hilda Blades an e-mail with the form and address.   KT ----- Message ----- From: Minus Breeding, MD Sent: 01/19/2021   3:25 PM EDT To: Eileen Stanford, PA-C  Hi,  Do you know how I make a referral to this guy?  I understand it is not just a phone call to him and his office?  Thanks.   ----- Message ----- From: Sherren Mocha, MD Sent: 01/08/2021  10:44 AM EDT To: Minus Breeding, MD  I would send to Delano Metz at [facility]. I will forward an email to you with his contact info ----- Message ----- From: Minus Breeding, MD Sent: 01/07/2021   1:35 PM EDT To: Sherren Mocha, MD  Ronalee Belts,  This patient would need multiple valves.  I would send her to [facility].  Do they do a valve clinic kind of thing?  Would I send her straight to the surgeons?  He has memory problems but lives alone.  She is not committed to surgery but would consider it.

## 2021-01-22 NOTE — Telephone Encounter (Signed)
Information faxed to dr Cheree Ditto at [phone]

## 2021-03-24 DIAGNOSIS — I350 Nonrheumatic aortic (valve) stenosis: Secondary | ICD-10-CM

## 2021-03-24 HISTORY — DX: Nonrheumatic aortic (valve) stenosis: I35.0

## 2021-05-03 ENCOUNTER — Other Ambulatory Visit: Payer: Self-pay | Admitting: Cardiology

## 2021-05-07 ENCOUNTER — Other Ambulatory Visit: Payer: Self-pay | Admitting: Cardiology

## 2021-05-11 ENCOUNTER — Encounter (HOSPITAL_COMMUNITY): Payer: Self-pay | Admitting: *Deleted

## 2021-05-11 ENCOUNTER — Emergency Department (HOSPITAL_COMMUNITY)
Admission: EM | Admit: 2021-05-11 | Discharge: 2021-05-11 | Disposition: A | Discharge status: Home / Self Care | Payer: Medicare Other | Attending: Emergency Medicine | Admitting: Emergency Medicine

## 2021-05-11 ENCOUNTER — Emergency Department (HOSPITAL_COMMUNITY): Payer: Medicare Other

## 2021-05-11 DIAGNOSIS — I2510 Atherosclerotic heart disease of native coronary artery without angina pectoris: Secondary | ICD-10-CM | POA: Diagnosis not present

## 2021-05-11 DIAGNOSIS — Z79899 Other long term (current) drug therapy: Secondary | ICD-10-CM | POA: Insufficient documentation

## 2021-05-11 DIAGNOSIS — Z7982 Long term (current) use of aspirin: Secondary | ICD-10-CM | POA: Insufficient documentation

## 2021-05-11 DIAGNOSIS — Z20822 Contact with and (suspected) exposure to covid-19: Secondary | ICD-10-CM | POA: Insufficient documentation

## 2021-05-11 DIAGNOSIS — R410 Disorientation, unspecified: Secondary | ICD-10-CM | POA: Diagnosis present

## 2021-05-11 DIAGNOSIS — I10 Essential (primary) hypertension: Secondary | ICD-10-CM | POA: Insufficient documentation

## 2021-05-11 LAB — CBC
HCT: 34.7 % — ABNORMAL LOW (ref 36.0–46.0)
Hemoglobin: 11.3 g/dL — ABNORMAL LOW (ref 12.0–15.0)
MCH: 29.1 pg (ref 26.0–34.0)
MCHC: 32.6 g/dL (ref 30.0–36.0)
MCV: 89.4 fL (ref 80.0–100.0)
Platelets: 138 10*3/uL — ABNORMAL LOW (ref 150–400)
RBC: 3.88 MIL/uL (ref 3.87–5.11)
RDW: 14.1 % (ref 11.5–15.5)
WBC: 3.4 10*3/uL — ABNORMAL LOW (ref 4.0–10.5)
nRBC: 0 % (ref 0.0–0.2)

## 2021-05-11 LAB — URINALYSIS, ROUTINE W REFLEX MICROSCOPIC
Bilirubin Urine: NEGATIVE
Glucose, UA: NEGATIVE mg/dL
Hgb urine dipstick: NEGATIVE
Ketones, ur: NEGATIVE mg/dL
Leukocytes,Ua: NEGATIVE
Nitrite: NEGATIVE
Protein, ur: NEGATIVE mg/dL
Specific Gravity, Urine: 1.017 (ref 1.005–1.030)
pH: 6 (ref 5.0–8.0)

## 2021-05-11 LAB — COMPREHENSIVE METABOLIC PANEL
ALT: 12 U/L (ref 0–44)
AST: 19 U/L (ref 15–41)
Albumin: 4.3 g/dL (ref 3.5–5.0)
Alkaline Phosphatase: 64 U/L (ref 38–126)
Anion gap: 7 (ref 5–15)
BUN: 13 mg/dL (ref 8–23)
CO2: 26 mmol/L (ref 22–32)
Calcium: 9.1 mg/dL (ref 8.9–10.3)
Chloride: 106 mmol/L (ref 98–111)
Creatinine, Ser: 0.98 mg/dL (ref 0.44–1.00)
GFR, Estimated: 58 mL/min — ABNORMAL LOW (ref >60)
Glucose, Bld: 110 mg/dL — ABNORMAL HIGH (ref 70–99)
Potassium: 4 mmol/L (ref 3.5–5.1)
Sodium: 139 mmol/L (ref 135–145)
Total Bilirubin: 0.9 mg/dL (ref 0.3–1.2)
Total Protein: 7.3 g/dL (ref 6.5–8.1)

## 2021-05-11 LAB — RESP PANEL BY RT-PCR (FLU A&B, COVID) ARPGX2
Influenza A by PCR: NEGATIVE
Influenza B by PCR: NEGATIVE
SARS Coronavirus 2 by RT PCR: NEGATIVE

## 2021-05-11 IMAGING — MR MR HEAD W/O CM
11 of 12 series · 41 of 48 positions shown · non-contrast
Comparison: Brain MRI [DATE].

CLINICAL DATA: Mental status change, unknown cause. Altered mental
status, confusion today, incontinent of urine.

EXAM:
MRI HEAD WITHOUT CONTRAST
TECHNIQUE: Multiplanar, multiecho pulse sequences of the brain and surrounding
structures were obtained without intravenous contrast.

[Series 5: DWI · axial · 4.0mm · 0.88mm/px · z∈[-63,+70]mm · 4 of 36 slices shown (1 of 6)]
[im 1/36]
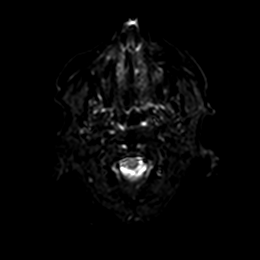
[im 12/36]
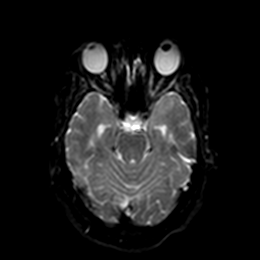
[im 24/36]
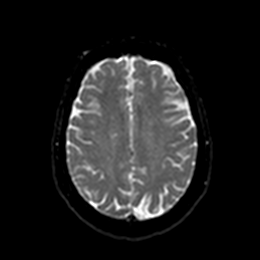
[im 36/36]
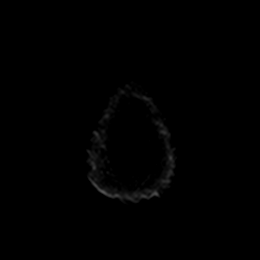

[Series 5: DWI · axial · 4.0mm · 0.88mm/px · z∈[-63,+70]mm · 4 of 36 slices shown (2 of 6)]
[im 1/36]
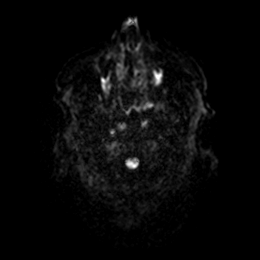
[im 12/36]
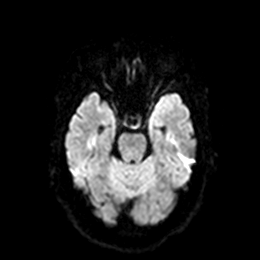
[im 24/36]
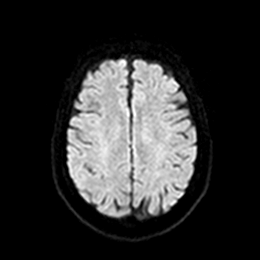
[im 36/36]
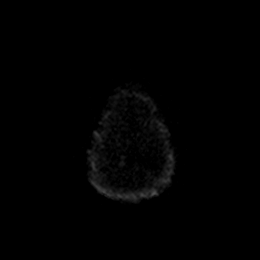

[Series 6: DWI · axial · 4.0mm · 0.88mm/px · z∈[-63,+70]mm · 4 of 36 slices shown (3 of 6)]
[im 1/36]
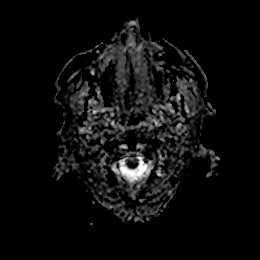
[im 12/36]
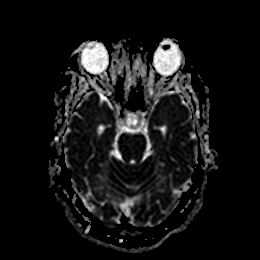
[im 24/36]
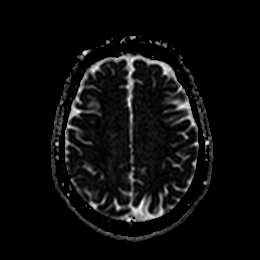
[im 36/36]
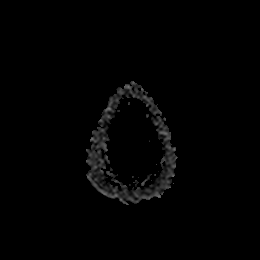

[Series 7: DWI · coronal · 5.0mm · 0.88mm/px · 4 of 28 slices shown (4 of 6)]
[im 1/28]
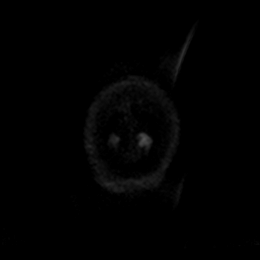
[im 10/28]
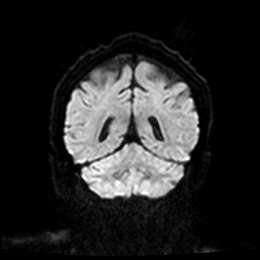
[im 19/28]
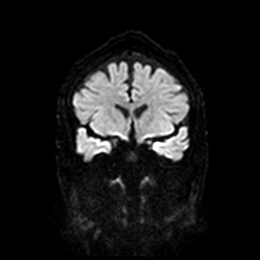
[im 28/28]
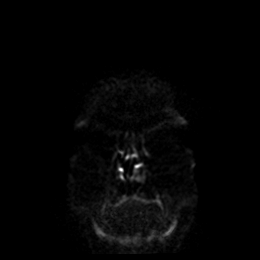

[Series 7: DWI · coronal · 5.0mm · 0.88mm/px · 4 of 28 slices shown (5 of 6)]
[im 1/28]
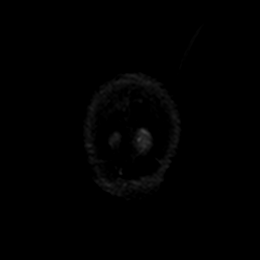
[im 10/28]
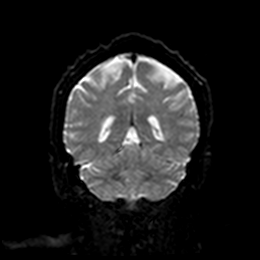
[im 19/28]
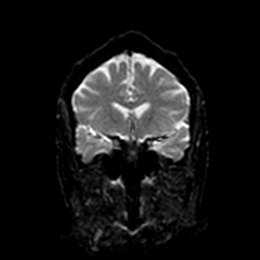
[im 28/28]
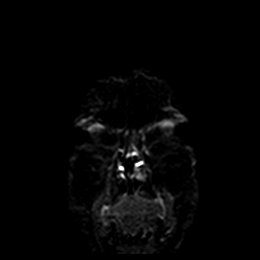

[Series 8: DWI · coronal · 5.0mm · 0.88mm/px · 4 of 28 slices shown (6 of 6)]
[im 1/28]
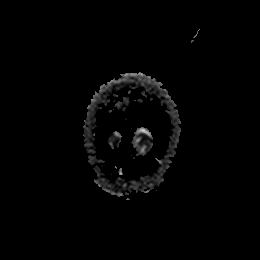
[im 10/28]
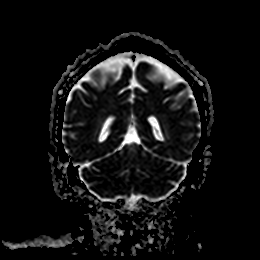
[im 19/28]
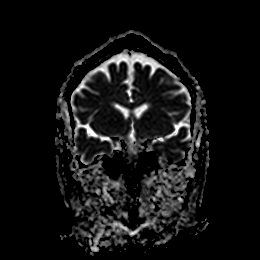
[im 28/28]
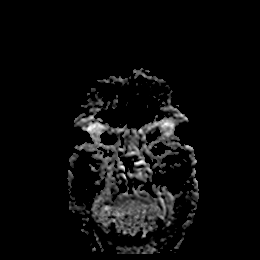

[Series 9: T1 · sagittal · 5.0mm · 0.94mm/px · 3 of 21 slices shown]
[im 1/21]
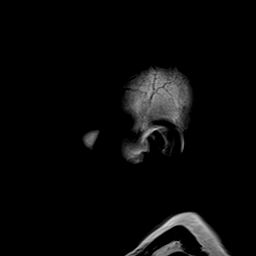
[im 11/21]
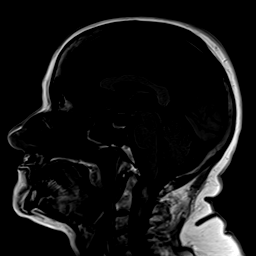
[im 21/21]
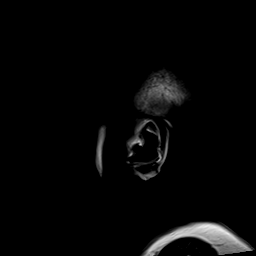

[Series 10: T2 · axial · 5.0mm · 0.72mm/px · z∈[-60,+66]mm · 3 of 20 slices shown (1 of 2)]
[im 1/20]
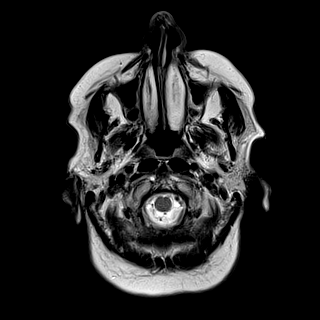
[im 10/20]
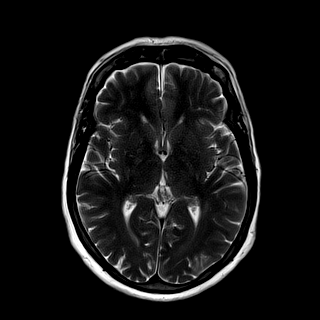
[im 20/20]
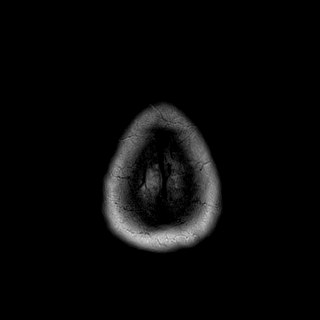

[Series 11: ax hemo · axial · 5.0mm · 0.86mm/px · z∈[-65,+72]mm · 3 of 25 slices shown]
[im 1/25]
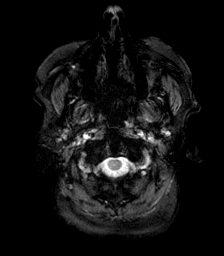
[im 13/25]
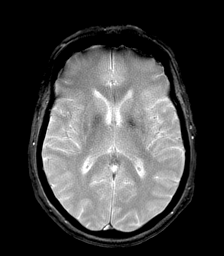
[im 25/25]
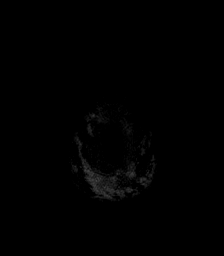

[Series 12: FLAIR · axial · 4.0mm · 0.43mm/px · z∈[-63,+70]mm · 5 of 36 slices shown]
[im 1/36]
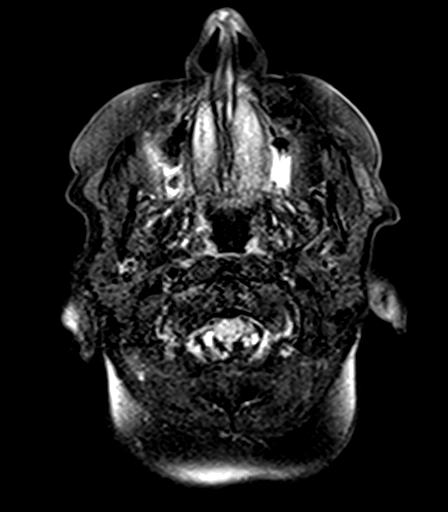
[im 9/36]
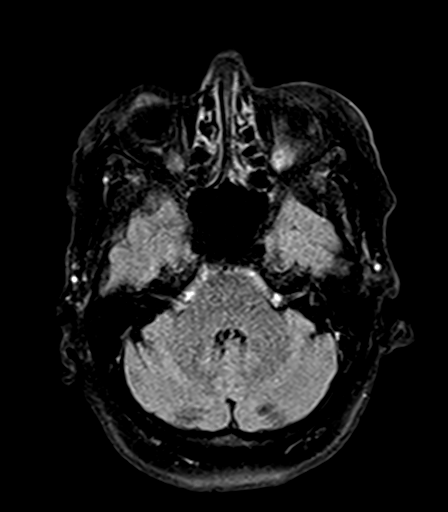
[im 18/36]
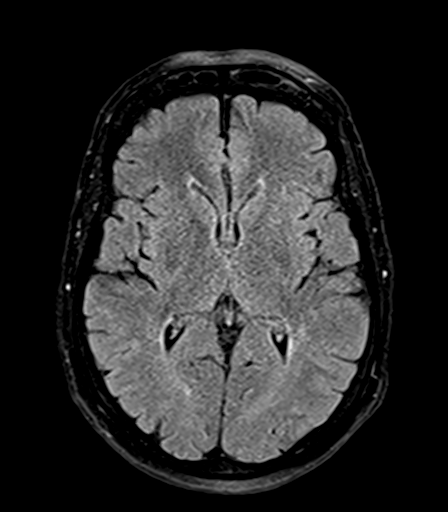
[im 27/36]
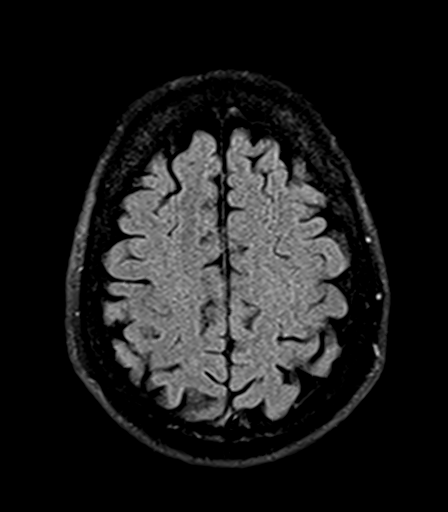
[im 36/36]
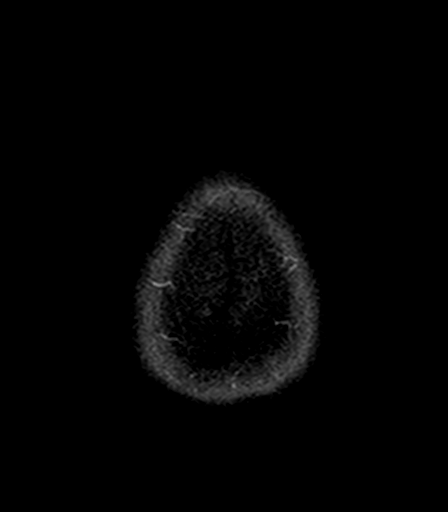

[Series 14: T2 · coronal · 5.0mm · 0.72mm/px · 3 of 26 slices shown (2 of 2)]
[im 1/26]
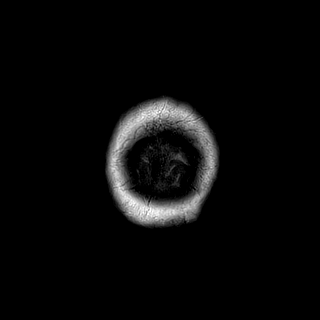
[im 13/26]
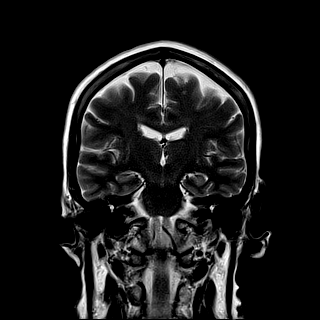
[im 26/26]
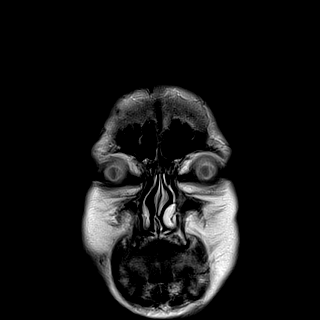

[41 of 48 positions shown; findings below may reference images not displayed]

FINDINGS: Brain:

Cerebral volume appears normal for age.

Minimal multifocal T2 FLAIR hyperintense signal abnormality within
the cerebral white matter, nonspecific but compatible with chronic
small vessel ischemic disease.

There is no acute infarct.

No evidence of an intracranial mass.

No chronic intracranial blood products.

No extra-axial fluid collection.

No midline shift.

Vascular: Maintained flow voids within the proximal large arterial
vessels.

Skull and upper cervical spine: No focal suspicious marrow lesion.

Sinuses/Orbits: Visualized orbits show no acute finding. Trace
mucosal thickening within the bilateral ethmoid sinuses. No
significant paranasal sinus disease.
IMPRESSION: No evidence of acute intracranial abnormality.

Minimal chronic small vessel ischemic changes within the cerebral
white matter.

Trace mucosal thickening within the bilateral ethmoid sinuses.

## 2021-05-11 MED ORDER — SODIUM CHLORIDE 0.9 % IV BOLUS
500.0000 mL | Freq: Once | INTRAVENOUS | Status: AC
  Administered 2021-05-11: 18:00:00 500 mL via INTRAVENOUS

## 2021-05-11 MED ORDER — CEPHALEXIN 500 MG PO CAPS: 500.0000 mg | ORAL_CAPSULE | Freq: Four times a day (QID) | ORAL | 0 refills | Status: DC

## 2021-05-11 NOTE — Discharge Instructions (Signed)
Follow-up with your family doctor the end of the week to check on the urine culture.  Make sure you keep your appointment with your surgeon on Thursday and take Tylenol for any fever and aches

## 2021-05-11 NOTE — ED Provider Notes (Signed)
[facility] EMERGENCY DEPARTMENT Provider Note   CSN: [id number] Arrival date & time: 05/11/21  1509     History Chief Complaint  Patient presents with   Altered Mental Status    Denise Page is a 80 y.o. female.  Patient has had mild confusion today.  According to her daughter this is happened before and she was admitted to the hospital with a negative work-up.  She had a EEG and imaging at that time.  The history is provided by the patient and a relative.  Altered Mental Status Presenting symptoms: disorientation   Severity:  Mild Most recent episode:  Today Episode history:  Multiple Timing:  Intermittent Chronicity:  New Context: not alcohol use   Associated symptoms: no abdominal pain, no hallucinations, no headaches, no rash and no seizures       Past Medical History:  Diagnosis Date   Anemia    Aortic stenosis    Bilateral carotid artery disease (HCC)    Enlarged thyroid    Hyperlipidemia    Hypertension    NSTEMI (non-ST elevated myocardial infarction) [facility])     Patient Active Problem List   Diagnosis Date Noted   Acute encephalopathy 11/20/2020   Other pancytopenia ([place]) 09/30/2020   Pulmonary HTN ([place]) 04/22/2020   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    Past Surgical History:  Procedure Laterality Date   BIOPSY  08/01/2020   Procedure: BIOPSY;  Surgeon: Ronnette Juniper, MD;  Location: [facility];  Service: Gastroenterology;;  EGD and COLON   CHOLECYSTECTOMY     COLONOSCOPY N/A 08/01/2020   Procedure: COLONOSCOPY;  Surgeon: Ronnette Juniper, MD;  Location: [facility] ENDOSCOPY;  Service: Gastroenterology;  Laterality: N/A;   CYSTECTOMY     ESOPHAGOGASTRODUODENOSCOPY (EGD) WITH PROPOFOL N/A 08/01/2020   Procedure: ESOPHAGOGASTRODUODENOSCOPY (EGD) WITH PROPOFOL;  Surgeon: Ronnette Juniper, MD;  Location: [facility] ENDOSCOPY;  Service: Gastroenterology;  Laterality: N/A;   POLYPECTOMY  08/01/2020   Procedure: POLYPECTOMY;  Surgeon: Ronnette Juniper, MD;  Location: [facility]  ENDOSCOPY;  Service: Gastroenterology;;   TEE WITHOUT CARDIOVERSION N/A 11/13/2020   Procedure: TRANSESOPHAGEAL ECHOCARDIOGRAM (TEE);  Surgeon: Josue Hector, MD;  Location: [facility] ENDOSCOPY;  Service: Cardiovascular;  Laterality: N/A;   TONSILLECTOMY       OB History   No obstetric history on file.     Family History  Problem Relation Age of Onset   Throat cancer Sister    Prostate cancer Brother     Social History   Tobacco Use   Smoking status: Never   Smokeless tobacco: Never    Home Medications Prior to Admission medications   Medication Sig Start Date End Date Taking? Authorizing Provider  cephALEXin (KEFLEX) 500 MG capsule Take 1 capsule (500 mg total) by mouth 4 (four) times daily. 05/11/21  Yes Milton Ferguson, MD  acetaminophen (TYLENOL) 650 MG CR tablet Take 650 mg by mouth every 8 (eight) hours as needed for pain.    [provider]  ALPRAZolam Duanne Moron) 0.5 MG tablet Take 0.5 mg by mouth 2 (two) times daily.    [provider]  aspirin EC 81 MG tablet Take 81 mg by mouth in the morning. Swallow whole.    [provider]  calcitonin, salmon, (MIACALCIN/FORTICAL) 200 UNIT/ACT nasal spray Place 1 spray into alternate nostrils daily as needed (pain). 06/11/20   [provider]  carvedilol (COREG) 6.25 MG tablet TAKE 1 TABLET BY MOUTH TWICE DAILY with a meal 10/23/20   Minus Breeding, MD  escitalopram (LEXAPRO) 5 MG  tablet Take 5 mg by mouth at bedtime.    [provider]  ferrous sulfate 325 (65 FE) MG tablet Take 325 mg by mouth in the morning and at bedtime.    [provider]  folic acid (FOLVITE) 1 MG tablet Take 1 mg by mouth in the morning.    [provider]  levothyroxine (SYNTHROID) 25 MCG tablet Take 25 mcg by mouth every morning. 10/06/20   [provider]  lisinopril (ZESTRIL) 5 MG tablet TAKE 1 TABLET BY MOUTH EVERY DAY 05/07/21   Minus Breeding, MD  pantoprazole (PROTONIX) 40 MG tablet Take 40  mg by mouth 2 (two) times daily.    [provider]  rosuvastatin (CRESTOR) 5 MG tablet TAKE 1 TABLET BY MOUTH EVERY DAY 05/04/21   Minus Breeding, MD  sucralfate (CARAFATE) 1 g tablet Take 1 g by mouth 2 (two) times daily.    [provider]  vitamin B-12 (CYANOCOBALAMIN) 1000 MCG tablet Take 1,000 mcg by mouth in the morning.    [provider]    Allergies    Morphine and related, Sulfa antibiotics, Codeine, and Tramadol  Review of Systems   Review of Systems  Constitutional:  Negative for appetite change and fatigue.  HENT:  Negative for congestion, ear discharge and sinus pressure.   Eyes:  Negative for discharge.  Respiratory:  Negative for cough.   Cardiovascular:  Negative for chest pain.  Gastrointestinal:  Negative for abdominal pain and diarrhea.  Genitourinary:  Negative for frequency and hematuria.  Musculoskeletal:  Negative for back pain.  Skin:  Negative for rash.  Neurological:  Negative for seizures and headaches.  Psychiatric/Behavioral:  Negative for hallucinations.    Physical Exam Updated Vital Signs BP (!) 142/64    Pulse 74    Temp 100.3 F (37.9 C) (Oral)    Resp 14    Ht 5\' 8"  (1.727 m)    Wt 82.1 kg Comment: bed scale   SpO2 99%    BMI 27.52 kg/m   Physical Exam Vitals and nursing note reviewed.  Constitutional:      Appearance: She is well-developed.  HENT:     Head: Normocephalic.     Nose: Nose normal.  Eyes:     General: No scleral icterus.    Conjunctiva/sclera: Conjunctivae normal.  Neck:     Thyroid: No thyromegaly.  Cardiovascular:     Rate and Rhythm: Normal rate and regular rhythm.     Heart sounds: No murmur heard.   No friction rub. No gallop.  Pulmonary:     Breath sounds: No stridor. No wheezing or rales.  Chest:     Chest wall: No tenderness.  Abdominal:     General: There is no distension.     Tenderness: There is no abdominal tenderness. There is no rebound.  Musculoskeletal:        General:  Normal range of motion.     Cervical back: Neck supple.  Lymphadenopathy:     Cervical: No cervical adenopathy.  Skin:    Findings: No erythema or rash.  Neurological:     Mental Status: She is alert.     Motor: No abnormal muscle tone.     Coordination: Coordination normal.     Comments: Patient alert oriented to person and place but not time  Psychiatric:        Behavior: Behavior normal.    ED Results / Procedures / Treatments   Labs (all labs ordered are listed,  but only abnormal results are displayed) Labs Reviewed  COMPREHENSIVE METABOLIC PANEL - Abnormal; Notable for the following components:      Result Value   Glucose, Bld 110 (*)    GFR, Estimated 58 (*)    All other components within normal limits  CBC - Abnormal; Notable for the following components:   WBC 3.4 (*)    Hemoglobin 11.3 (*)    HCT 34.7 (*)    Platelets 138 (*)    All other components within normal limits  RESP PANEL BY RT-PCR (FLU A&B, COVID) [id number]  URINE CULTURE  URINALYSIS, ROUTINE W REFLEX MICROSCOPIC  CBG MONITORING, ED    EKG None  Radiology MR BRAIN WO CONTRAST  Result Date: 05/11/2021 CLINICAL DATA:  Mental status change, unknown cause. Altered mental status, confusion today, incontinent of urine. EXAM: MRI HEAD WITHOUT CONTRAST TECHNIQUE: Multiplanar, multiecho pulse sequences of the brain and surrounding structures were obtained without intravenous contrast. COMPARISON:  Brain MRI 11/20/2020. FINDINGS: Brain: Cerebral volume appears normal for age. Minimal multifocal T2 FLAIR hyperintense signal abnormality within the cerebral white matter, nonspecific but compatible with chronic small vessel ischemic disease. There is no acute infarct. No evidence of an intracranial mass. No chronic intracranial blood products. No extra-axial fluid collection. No midline shift. Vascular: Maintained flow voids within the proximal large arterial vessels. Skull and upper cervical spine: No focal suspicious  marrow lesion. Sinuses/Orbits: Visualized orbits show no acute finding. Trace mucosal thickening within the bilateral ethmoid sinuses. No significant paranasal sinus disease. IMPRESSION: No evidence of acute intracranial abnormality. Minimal chronic small vessel ischemic changes within the cerebral white matter. Trace mucosal thickening within the bilateral ethmoid sinuses. Electronically Signed   By: Kellie Simmering D.O.   On: 05/11/2021 19:14    Procedures Procedures   Medications Ordered in ED Medications  sodium chloride 0.9 % bolus 500 mL (500 mLs Intravenous New Bag/Given 05/11/21 1753)    ED Course  I have reviewed the triage vital signs and the nursing notes.  Pertinent labs & imaging results that were available during my care of the patient were reviewed by me and considered in my medical decision making (see chart for details). MRI unremarkable labs unremarkable   MDM Rules/Calculators/A&P                         Patient with mild confusion and minimal fever.  She will get a urine culture and empirically started on Keflex and follow-up with her PCP for culture results    Final Clinical Impression(s) / ED Diagnoses Final diagnoses:  Confusion    Rx / DC Orders ED Discharge Orders          Ordered    cephALEXin (KEFLEX) 500 MG capsule  4 times daily        05/11/21 2042             Milton Ferguson, MD 05/12/21 1116

## 2021-05-11 NOTE — ED Notes (Signed)
Pt temperature is elevated and complaining of chills/cold. Pt is oriented to person and place. Disoriented to situation and time.  Pt repeats the question "what's wrong with me"  Not remembering previous conversations to the full extent

## 2021-05-11 NOTE — ED Triage Notes (Signed)
Altered mental status, confused today, incontinent of urine

## 2021-05-13 LAB — URINE CULTURE

## 2021-05-20 ENCOUNTER — Inpatient Hospital Stay: Payer: Medicare Other | Admitting: Hematology and Oncology

## 2021-05-20 NOTE — Progress Notes (Deleted)
[facility] NOTE  Patient Care Team: Rosine Door as PCP - General (Physician Assistant) Minus Breeding, MD as PCP - Cardiology (Cardiology)  CHIEF COMPLAINTS/PURPOSE OF CONSULTATION:  Pancytopenia.  ASSESSMENT & PLAN:  No problem-specific Assessment & Plan notes found for this encounter.  No orders of the defined types were placed in this encounter.    HISTORY OF PRESENTING ILLNESS:   Denise Page 80 y.o. female is here because of pancytopenia.  This is a very pleasant 80 yr old female patient with PMH significant for HTN, dyslipidemia, bilateral carotid disease, AS, NSTEMI referred to hematology for evaluation of pancytopenia.    Interval history Rest of the pertinent 10 point ROS reviewed and negative.  MEDICAL HISTORY:  Past Medical History:  Diagnosis Date   Anemia    Aortic stenosis    Bilateral carotid artery disease ([place])    Enlarged thyroid    Hyperlipidemia    Hypertension    NSTEMI (non-ST elevated myocardial infarction) [facility])     SURGICAL HISTORY: Past Surgical History:  Procedure Laterality Date   BIOPSY  08/01/2020   Procedure: BIOPSY;  Surgeon: Ronnette Juniper, MD;  Location: [facility] ENDOSCOPY;  Service: Gastroenterology;;  EGD and COLON   CHOLECYSTECTOMY     COLONOSCOPY N/A 08/01/2020   Procedure: COLONOSCOPY;  Surgeon: Ronnette Juniper, MD;  Location: [facility] ENDOSCOPY;  Service: Gastroenterology;  Laterality: N/A;   CYSTECTOMY     ESOPHAGOGASTRODUODENOSCOPY (EGD) WITH PROPOFOL N/A 08/01/2020   Procedure: ESOPHAGOGASTRODUODENOSCOPY (EGD) WITH PROPOFOL;  Surgeon: Ronnette Juniper, MD;  Location: [facility] ENDOSCOPY;  Service: Gastroenterology;  Laterality: N/A;   POLYPECTOMY  08/01/2020   Procedure: POLYPECTOMY;  Surgeon: Ronnette Juniper, MD;  Location: [facility] ENDOSCOPY;  Service: Gastroenterology;;   TEE WITHOUT CARDIOVERSION N/A 11/13/2020   Procedure: TRANSESOPHAGEAL ECHOCARDIOGRAM (TEE);  Surgeon: Josue Hector, MD;  Location: [facility] ENDOSCOPY;   Service: Cardiovascular;  Laterality: N/A;   TONSILLECTOMY      SOCIAL HISTORY: Social History   Socioeconomic History   Marital status: Single    Spouse name: Not on file   Number of children: Not on file   Years of education: Not on file   Highest education level: Not on file  Occupational History   Not on file  Tobacco Use   Smoking status: Never   Smokeless tobacco: Never  Substance and Sexual Activity   Alcohol use: Not on file   Drug use: Not on file   Sexual activity: Not on file  Other Topics Concern   Not on file  Social History Narrative   Not on file   Social Determinants of Health   Financial Resource Strain: Not on file  Food Insecurity: Not on file  Transportation Needs: Not on file  Physical Activity: Not on file  Stress: Not on file  Social Connections: Not on file  Intimate Partner Violence: Not on file    FAMILY HISTORY: Family History  Problem Relation Age of Onset   Throat cancer Sister    Prostate cancer Brother     ALLERGIES:  is allergic to morphine and related, sulfa antibiotics, codeine, and tramadol.  MEDICATIONS:  Current Outpatient Medications  Medication Sig Dispense Refill   acetaminophen (TYLENOL) 650 MG CR tablet Take 650 mg by mouth every 8 (eight) hours as needed for pain.     ALPRAZolam (XANAX) 0.5 MG tablet Take 0.5 mg by mouth 2 (two) times daily.     aspirin EC 81 MG tablet Take 81 mg by mouth in  the morning. Swallow whole.     calcitonin, salmon, (MIACALCIN/FORTICAL) 200 UNIT/ACT nasal spray Place 1 spray into alternate nostrils daily as needed (pain).     carvedilol (COREG) 6.25 MG tablet TAKE 1 TABLET BY MOUTH TWICE DAILY with a meal 180 tablet 1   cephALEXin (KEFLEX) 500 MG capsule Take 1 capsule (500 mg total) by mouth 4 (four) times daily. 28 capsule 0   escitalopram (LEXAPRO) 5 MG tablet Take 5 mg by mouth at bedtime.     ferrous sulfate 325 (65 FE) MG tablet Take 325 mg by mouth in the morning and at bedtime.      folic acid (FOLVITE) 1 MG tablet Take 1 mg by mouth in the morning.     levothyroxine (SYNTHROID) 25 MCG tablet Take 25 mcg by mouth every morning.     lisinopril (ZESTRIL) 5 MG tablet TAKE 1 TABLET BY MOUTH EVERY DAY 90 tablet 1   pantoprazole (PROTONIX) 40 MG tablet Take 40 mg by mouth 2 (two) times daily.     rosuvastatin (CRESTOR) 5 MG tablet TAKE 1 TABLET BY MOUTH EVERY DAY 90 tablet 1   sucralfate (CARAFATE) 1 g tablet Take 1 g by mouth 2 (two) times daily.     vitamin B-12 (CYANOCOBALAMIN) 1000 MCG tablet Take 1,000 mcg by mouth in the morning.     No current facility-administered medications for this visit.   PHYSICAL EXAMINATION: ECOG PERFORMANCE STATUS: 0 - Asymptomatic  There were no vitals filed for this visit.  There were no vitals filed for this visit.   Physical Exam Constitutional:      Appearance: Normal appearance. She is normal weight.  HENT:     Head: Normocephalic and atraumatic.  Cardiovascular:     Rate and Rhythm: Normal rate and regular rhythm.     Pulses: Normal pulses.     Heart sounds: Normal heart sounds.  Pulmonary:     Effort: Pulmonary effort is normal.     Breath sounds: Normal breath sounds.  Abdominal:     General: Abdomen is flat. Bowel sounds are normal.     Palpations: Abdomen is soft.  Musculoskeletal:        General: Swelling (Bilateral lower extremity swelling, symmetrical and 1+.) present. No tenderness.  Skin:    General: Skin is warm and dry.  Neurological:     General: No focal deficit present.     Mental Status: She is alert.  Psychiatric:        Mood and Affect: Mood normal.        Behavior: Behavior normal.     LABORATORY DATA:  I have reviewed the data as listed Lab Results  Component Value Date   WBC 3.4 (L) 05/11/2021   HGB 11.3 (L) 05/11/2021   HCT 34.7 (L) 05/11/2021   MCV 89.4 05/11/2021   PLT 138 (L) 05/11/2021     Chemistry      Component Value Date/Time   NA 139 05/11/2021 1620   K 4.0 05/11/2021 1620    CL 106 05/11/2021 1620   CO2 26 05/11/2021 1620   BUN 13 05/11/2021 1620   CREATININE 0.98 05/11/2021 1620   CREATININE 1.26 (H) 10/21/2020 1517      Component Value Date/Time   CALCIUM 9.1 05/11/2021 1620   ALKPHOS 64 05/11/2021 1620   AST 19 05/11/2021 1620   AST 13 (L) 10/21/2020 1517   ALT 12 05/11/2021 1620   ALT 11 10/21/2020 1517   BILITOT 0.9 05/11/2021 1620  BILITOT 0.9 10/21/2020 1517     I have reviewed labs. CBC 12/22.  White blood cell count of 4000, hemoglobin of 10.8, platelet count 127,000. Her blood counts appear to be slowly improving compared to November 2022 labs.  RADIOGRAPHIC STUDIES: I have personally reviewed the radiological images as listed and agreed with the findings in the report. MR BRAIN WO CONTRAST  Result Date: 05/11/2021 CLINICAL DATA:  Mental status change, unknown cause. Altered mental status, confusion today, incontinent of urine. EXAM: MRI HEAD WITHOUT CONTRAST TECHNIQUE: Multiplanar, multiecho pulse sequences of the brain and surrounding structures were obtained without intravenous contrast. COMPARISON:  Brain MRI 11/20/2020. FINDINGS: Brain: Cerebral volume appears normal for age. Minimal multifocal T2 FLAIR hyperintense signal abnormality within the cerebral white matter, nonspecific but compatible with chronic small vessel ischemic disease. There is no acute infarct. No evidence of an intracranial mass. No chronic intracranial blood products. No extra-axial fluid collection. No midline shift. Vascular: Maintained flow voids within the proximal large arterial vessels. Skull and upper cervical spine: No focal suspicious marrow lesion. Sinuses/Orbits: Visualized orbits show no acute finding. Trace mucosal thickening within the bilateral ethmoid sinuses. No significant paranasal sinus disease. IMPRESSION: No evidence of acute intracranial abnormality. Minimal chronic small vessel ischemic changes within the cerebral white matter. Trace mucosal  thickening within the bilateral ethmoid sinuses. Electronically Signed   By: Kellie Simmering D.O.   On: 05/11/2021 19:14    All questions were answered. The patient knows to call the clinic with any problems, questions or concerns. I spent 20 minutes in the care of this patient including H and P, review of records, counseling and coordination of care. Repeat labs ordered.    Benay Pike, MD 05/20/2021 12:58 PM

## 2021-05-20 NOTE — Progress Notes (Incomplete)
[place] NOTE  Patient Care Team: Rosine Door as PCP - General (Physician Assistant) Minus Breeding, MD as PCP - Cardiology (Cardiology)  CHIEF COMPLAINTS/PURPOSE OF CONSULTATION:  Pancytopenia.  ASSESSMENT & PLAN:  No problem-specific Assessment & Plan notes found for this encounter.  No orders of the defined types were placed in this encounter.    HISTORY OF PRESENTING ILLNESS:   Denise Page 80 y.o. female is here because of pancytopenia.  This is a very pleasant 80 yr old female patient with PMH significant for HTN, dyslipidemia, bilateral carotid disease, AS, NSTEMI referred to hematology for evaluation of pancytopenia.     Interval history Patient is here for a follow-up with her daughter.  Since her last visit, she continues on iron supplementation, recently started levothyroxine and B12 supplementation about 2 weeks ago. Since her last visit, she has been doing well according to her daughter, eating better, energy levels are better.  Daughter feels like she could have a conversation with her mom now.  She is compliant with all her medications.  No interim infections or hospitalizations.  She is recommended to meet a cardiothoracic surgeon at [facility] for a possible valve replacement.  She is understandably nervous about the surgery. Rest of the pertinent 10 point ROS reviewed and negative.  MEDICAL HISTORY:  Past Medical History:  Diagnosis Date   Anemia    Aortic stenosis    Bilateral carotid artery disease ([place])    Enlarged thyroid    Hyperlipidemia    Hypertension    NSTEMI (non-ST elevated myocardial infarction) [facility])     SURGICAL HISTORY: Past Surgical History:  Procedure Laterality Date   BIOPSY  08/01/2020   Procedure: BIOPSY;  Surgeon: Ronnette Juniper, MD;  Location: [facility] ENDOSCOPY;  Service: Gastroenterology;;  EGD and COLON   CHOLECYSTECTOMY     COLONOSCOPY N/A 08/01/2020   Procedure: COLONOSCOPY;  Surgeon:  Ronnette Juniper, MD;  Location: [facility] ENDOSCOPY;  Service: Gastroenterology;  Laterality: N/A;   CYSTECTOMY     ESOPHAGOGASTRODUODENOSCOPY (EGD) WITH PROPOFOL N/A 08/01/2020   Procedure: ESOPHAGOGASTRODUODENOSCOPY (EGD) WITH PROPOFOL;  Surgeon: Ronnette Juniper, MD;  Location: [facility]OS[facility];  Service: Gastroenterology;  Laterality: N/A;   POLYPECTOMY  08/01/2020   Procedure: POLYPECTOMY;  Surgeon: Ronnette Juniper, MD;  Location: [facility] ENDOS[facility];  Service: Gastroenterology;;   TEE WITHOUT CARDIOVERSION N/A 11/13/2020   Procedure: TRANSESOPHAGEAL ECHOCARDIOGRAM (TEE);  Surgeon: Josue Hector, MD;  Location: [facility] ENDOSCOPY;  Service: Cardiovascular;  Laterality: N/A;   TONSILLECTOMY      SOCIAL HISTORY: Social History   Socioeconomic History   Marital status: Single    Spouse name: Not on file   Number of children: Not on file   Years of education: Not on file   Highest education level: Not on file  Occupational History   Not on file  Tobacco Use   Smoking status: Never   Smokeless tobacco: Never  Substance and Sexual Activity   Alcohol use: Not on file   Drug use: Not on file   Sexual activity: Not on file  Other Topics Concern   Not on file  Social History Narrative   Not on file   Social Determinants of Health   Financial Resource Strain: Not on file  Food Insecurity: Not on file  Transportation Needs: Not on file  Physical Activity: Not on file  Stress: Not on file  Social Connections: Not on file  Intimate Partner Violence: Not on file    FAMILY HISTORY: Family  History  Problem Relation Age of Onset   Throat cancer Sister    Prostate cancer Brother     ALLERGIES:  is allergic to morphine and related, sulfa antibiotics, codeine, and tramadol.  MEDICATIONS:  Current Outpatient Medications  Medication Sig Dispense Refill   acetaminophen (TYLENOL) 650 MG CR tablet Take 650 mg by mouth every 8 (eight) hours as needed for pain.     ALPRAZolam (XANAX) 0.5 MG tablet  Take 0.5 mg by mouth 2 (two) times daily.     aspirin EC 81 MG tablet Take 81 mg by mouth in the morning. Swallow whole.     calcitonin, salmon, (MIACALCIN/FORTICAL) 200 UNIT/ACT nasal spray Place 1 spray into alternate nostrils daily as needed (pain).     carvedilol (COREG) 6.25 MG tablet TAKE 1 TABLET BY MOUTH TWICE DAILY with a meal 180 tablet 1   cephALEXin (KEFLEX) 500 MG capsule Take 1 capsule (500 mg total) by mouth 4 (four) times daily. 28 capsule 0   escitalopram (LEXAPRO) 5 MG tablet Take 5 mg by mouth at bedtime.     ferrous sulfate 325 (65 FE) MG tablet Take 325 mg by mouth in the morning and at bedtime.     folic acid (FOLVITE) 1 MG tablet Take 1 mg by mouth in the morning.     levothyroxine (SYNTHROID) 25 MCG tablet Take 25 mcg by mouth every morning.     lisinopril (ZESTRIL) 5 MG tablet TAKE 1 TABLET BY MOUTH EVERY DAY 90 tablet 1   pantoprazole (PROTONIX) 40 MG tablet Take 40 mg by mouth 2 (two) times daily.     rosuvastatin (CRESTOR) 5 MG tablet TAKE 1 TABLET BY MOUTH EVERY DAY 90 tablet 1   sucralfate (CARAFATE) 1 g tablet Take 1 g by mouth 2 (two) times daily.     vitamin B-12 (CYANOCOBALAMIN) 1000 MCG tablet Take 1,000 mcg by mouth in the morning.     No current facility-administered medications for this visit.   PHYSICAL EXAMINATION: ECOG PERFORMANCE STATUS: 0 - Asymptomatic  There were no vitals filed for this visit.  There were no vitals filed for this visit.   Physical Exam Constitutional:      Appearance: Normal appearance. She is normal weight.  HENT:     Head: Normocephalic and atraumatic.  Cardiovascular:     Rate and Rhythm: Normal rate and regular rhythm.     Pulses: Normal pulses.     Heart sounds: Normal heart sounds.  Pulmonary:     Effort: Pulmonary effort is normal.     Breath sounds: Normal breath sounds.  Abdominal:     General: Abdomen is flat. Bowel sounds are normal.     Palpations: Abdomen is soft.  Musculoskeletal:         General: Swelling (Bilateral lower extremity swelling, symmetrical and 1+.) present. No tenderness.  Skin:    General: Skin is warm and dry.  Neurological:     General: No focal deficit present.     Mental Status: She is alert.  Psychiatric:        Mood and Affect: Mood normal.        Behavior: Behavior normal.     LABORATORY DATA:  I have reviewed the data as listed Lab Results  Component Value Date   WBC 3.4 (L) 05/11/2021   HGB 11.3 (L) 05/11/2021   HCT 34.7 (L) 05/11/2021   MCV 89.4 05/11/2021   PLT 138 (L) 05/11/2021     Chemistry  Component Value Date/Time   NA 139 05/11/2021 1620   K 4.0 05/11/2021 1620   CL 106 05/11/2021 1620   CO2 26 05/11/2021 1620   BUN 13 05/11/2021 1620   CREATININE 0.98 05/11/2021 1620   CREATININE 1.26 (H) 10/21/2020 1517      Component Value Date/Time   CALCIUM 9.1 05/11/2021 1620   ALKPHOS 64 05/11/2021 1620   AST 19 05/11/2021 1620   AST 13 (L) 10/21/2020 1517   ALT 12 05/11/2021 1620   ALT 11 10/21/2020 1517   BILITOT 0.9 05/11/2021 1620   BILITOT 0.9 10/21/2020 1517     I have reviewed labs. CBC repeated today WBC 3K, Hb 11.5, platelet count 148K. Ferritin 48, TSH normal.  RADIOGRAPHIC STUDIES: I have personally reviewed the radiological images as listed and agreed with the findings in the report. MR BRAIN WO CONTRAST  Result Date: 05/11/2021 CLINICAL DATA:  Mental status change, unknown cause. Altered mental status, confusion today, incontinent of urine. EXAM: MRI HEAD WITHOUT CONTRAST TECHNIQUE: Multiplanar, multiecho pulse sequences of the brain and surrounding structures were obtained without intravenous contrast. COMPARISON:  Brain MRI 11/20/2020. FINDINGS: Brain: Cerebral volume appears normal for age. Minimal multifocal T2 FLAIR hyperintense signal abnormality within the cerebral white matter, nonspecific but compatible with chronic small vessel ischemic disease. There is no acute infarct. No evidence of an  intracranial mass. No chronic intracranial blood products. No extra-axial fluid collection. No midline shift. Vascular: Maintained flow voids within the proximal large arterial vessels. Skull and upper cervical spine: No focal suspicious marrow lesion. Sinuses/Orbits: Visualized orbits show no acute finding. Trace mucosal thickening within the bilateral ethmoid sinuses. No significant paranasal sinus disease. IMPRESSION: No evidence of acute intracranial abnormality. Minimal chronic small vessel ischemic changes within the cerebral white matter. Trace mucosal thickening within the bilateral ethmoid sinuses. Electronically Signed   By: Kellie Simmering D.O.   On: 05/11/2021 19:14    All questions were answered. The patient knows to call the clinic with any problems, questions or concerns. I spent 20 minutes in the care of this patient including H and P, review of records, counseling and coordination of care. Repeat labs ordered.    Benay Pike, MD 05/20/2021 12:42 PM

## 2021-05-20 NOTE — Assessment & Plan Note (Deleted)
Patient is a very pleasant 80 year old female patient with past medical history significant for hypertension, non-STEMI, aortic stenosis referred to hematology for evaluation of pancytopenia.   During her initial visit we added some blood work which suggested presence of hypothyroidism.  She was started on levothyroxine supplementation, iron and B12 supplementation. She is here for FU.  During her last visit, we have discussed about continuing supplementation with iron, B12 and levothyroxine and follow-up with labs every few months and if she continues to have progressive cytopenias, we can consider bone marrow aspiration and biopsy.  She was not willing to do any aggressive procedures.  She is here for follow-up.  Labs compared to November 2022 have showed significant improvement in white blood cell count, hemoglobin as well as platelets.  I have encouraged her to continue iron, B12 and levothyroxine and return to clinic in 3 months. Thank you for consulting [place] in the care of this patient.  Please not hesitate to contact us with any additional questions or concerns

## 2021-06-12 ENCOUNTER — Other Ambulatory Visit: Payer: Self-pay | Admitting: Cardiology

## 2021-07-01 ENCOUNTER — Ambulatory Visit: Payer: Medicare Other | Admitting: Cardiology

## 2021-07-10 ENCOUNTER — Encounter (HOSPITAL_COMMUNITY): Payer: Self-pay

## 2021-07-10 ENCOUNTER — Emergency Department (HOSPITAL_COMMUNITY): Payer: Medicare Other

## 2021-07-10 ENCOUNTER — Other Ambulatory Visit: Payer: Self-pay

## 2021-07-10 ENCOUNTER — Inpatient Hospital Stay (HOSPITAL_COMMUNITY)
Admission: EM | Admit: 2021-07-10 | Discharge: 2021-07-17 | DRG: 082 | Disposition: A | Discharge status: Skilled Nursing Facility | Payer: Medicare Other | Attending: Internal Medicine | Admitting: Internal Medicine

## 2021-07-10 DIAGNOSIS — F32A Depression, unspecified: Secondary | ICD-10-CM | POA: Diagnosis present

## 2021-07-10 DIAGNOSIS — Y92009 Unspecified place in unspecified non-institutional (private) residence as the place of occurrence of the external cause: Secondary | ICD-10-CM

## 2021-07-10 DIAGNOSIS — F411 Generalized anxiety disorder: Secondary | ICD-10-CM | POA: Diagnosis present

## 2021-07-10 DIAGNOSIS — Z20822 Contact with and (suspected) exposure to covid-19: Secondary | ICD-10-CM | POA: Diagnosis present

## 2021-07-10 DIAGNOSIS — S065XAA Traumatic subdural hemorrhage with loss of consciousness status unknown, initial encounter: Principal | ICD-10-CM | POA: Diagnosis present

## 2021-07-10 DIAGNOSIS — Z952 Presence of prosthetic heart valve: Secondary | ICD-10-CM

## 2021-07-10 DIAGNOSIS — Z515 Encounter for palliative care: Secondary | ICD-10-CM

## 2021-07-10 DIAGNOSIS — R7301 Impaired fasting glucose: Secondary | ICD-10-CM | POA: Diagnosis present

## 2021-07-10 DIAGNOSIS — I5032 Chronic diastolic (congestive) heart failure: Secondary | ICD-10-CM | POA: Diagnosis present

## 2021-07-10 DIAGNOSIS — Y9301 Activity, walking, marching and hiking: Secondary | ICD-10-CM | POA: Diagnosis present

## 2021-07-10 DIAGNOSIS — Z7982 Long term (current) use of aspirin: Secondary | ICD-10-CM

## 2021-07-10 DIAGNOSIS — Z885 Allergy status to narcotic agent status: Secondary | ICD-10-CM

## 2021-07-10 DIAGNOSIS — L0233 Carbuncle of buttock: Secondary | ICD-10-CM | POA: Diagnosis present

## 2021-07-10 DIAGNOSIS — B029 Zoster without complications: Secondary | ICD-10-CM | POA: Diagnosis present

## 2021-07-10 DIAGNOSIS — R296 Repeated falls: Secondary | ICD-10-CM | POA: Diagnosis present

## 2021-07-10 DIAGNOSIS — G935 Compression of brain: Secondary | ICD-10-CM | POA: Diagnosis present

## 2021-07-10 DIAGNOSIS — Z6825 Body mass index (BMI) 25.0-25.9, adult: Secondary | ICD-10-CM

## 2021-07-10 DIAGNOSIS — I2510 Atherosclerotic heart disease of native coronary artery without angina pectoris: Secondary | ICD-10-CM | POA: Diagnosis present

## 2021-07-10 DIAGNOSIS — R531 Weakness: Secondary | ICD-10-CM

## 2021-07-10 DIAGNOSIS — Z79899 Other long term (current) drug therapy: Secondary | ICD-10-CM

## 2021-07-10 DIAGNOSIS — E43 Unspecified severe protein-calorie malnutrition: Secondary | ICD-10-CM | POA: Diagnosis present

## 2021-07-10 DIAGNOSIS — Z79890 Hormone replacement therapy (postmenopausal): Secondary | ICD-10-CM

## 2021-07-10 DIAGNOSIS — D649 Anemia, unspecified: Secondary | ICD-10-CM | POA: Diagnosis present

## 2021-07-10 DIAGNOSIS — K449 Diaphragmatic hernia without obstruction or gangrene: Secondary | ICD-10-CM | POA: Diagnosis present

## 2021-07-10 DIAGNOSIS — E785 Hyperlipidemia, unspecified: Secondary | ICD-10-CM | POA: Diagnosis present

## 2021-07-10 DIAGNOSIS — W1830XA Fall on same level, unspecified, initial encounter: Secondary | ICD-10-CM | POA: Diagnosis present

## 2021-07-10 DIAGNOSIS — Z8249 Family history of ischemic heart disease and other diseases of the circulatory system: Secondary | ICD-10-CM

## 2021-07-10 DIAGNOSIS — I252 Old myocardial infarction: Secondary | ICD-10-CM

## 2021-07-10 DIAGNOSIS — Z882 Allergy status to sulfonamides status: Secondary | ICD-10-CM

## 2021-07-10 DIAGNOSIS — I110 Hypertensive heart disease with heart failure: Secondary | ICD-10-CM | POA: Diagnosis present

## 2021-07-10 DIAGNOSIS — Z66 Do not resuscitate: Secondary | ICD-10-CM | POA: Diagnosis present

## 2021-07-10 DIAGNOSIS — E039 Hypothyroidism, unspecified: Secondary | ICD-10-CM | POA: Diagnosis present

## 2021-07-10 HISTORY — DX: Gastric ulcer, unspecified as acute or chronic, without hemorrhage or perforation: K25.9

## 2021-07-10 HISTORY — DX: Rheumatic mitral stenosis: I05.0

## 2021-07-10 LAB — CBC WITH DIFFERENTIAL/PLATELET
Abs Immature Granulocytes: 0.46 10*3/uL — ABNORMAL HIGH (ref 0.00–0.07)
Basophils Absolute: 0 10*3/uL (ref 0.0–0.1)
Basophils Relative: 0 %
Eosinophils Absolute: 0 10*3/uL (ref 0.0–0.5)
Eosinophils Relative: 0 %
HCT: 28 % — ABNORMAL LOW (ref 36.0–46.0)
Hemoglobin: 9 g/dL — ABNORMAL LOW (ref 12.0–15.0)
Immature Granulocytes: 10 %
Lymphocytes Relative: 23 %
Lymphs Abs: 1.1 10*3/uL (ref 0.7–4.0)
MCH: 28.3 pg (ref 26.0–34.0)
MCHC: 32.1 g/dL (ref 30.0–36.0)
MCV: 88.1 fL (ref 80.0–100.0)
Monocytes Absolute: 1 10*3/uL (ref 0.1–1.0)
Monocytes Relative: 22 %
Neutro Abs: 2.1 10*3/uL (ref 1.7–7.7)
Neutrophils Relative %: 45 %
Platelets: 250 10*3/uL (ref 150–400)
RBC: 3.18 MIL/uL — ABNORMAL LOW (ref 3.87–5.11)
RDW: 13.8 % (ref 11.5–15.5)
WBC: 4.7 10*3/uL (ref 4.0–10.5)
nRBC: 0.4 % — ABNORMAL HIGH (ref 0.0–0.2)

## 2021-07-10 LAB — COMPREHENSIVE METABOLIC PANEL
ALT: 21 U/L (ref 0–44)
AST: 24 U/L (ref 15–41)
Albumin: 3.1 g/dL — ABNORMAL LOW (ref 3.5–5.0)
Alkaline Phosphatase: 96 U/L (ref 38–126)
Anion gap: 14 (ref 5–15)
BUN: 13 mg/dL (ref 8–23)
CO2: 18 mmol/L — ABNORMAL LOW (ref 22–32)
Calcium: 8.5 mg/dL — ABNORMAL LOW (ref 8.9–10.3)
Chloride: 107 mmol/L (ref 98–111)
Creatinine, Ser: 0.84 mg/dL (ref 0.44–1.00)
GFR, Estimated: >60 mL/min (ref >60)
Glucose, Bld: 82 mg/dL (ref 70–99)
Potassium: 3.8 mmol/L (ref 3.5–5.1)
Sodium: 139 mmol/L (ref 135–145)
Total Bilirubin: 1 mg/dL (ref 0.3–1.2)
Total Protein: 6.4 g/dL — ABNORMAL LOW (ref 6.5–8.1)

## 2021-07-10 LAB — RESP PANEL BY RT-PCR (FLU A&B, COVID) ARPGX2
Influenza A by PCR: NEGATIVE
Influenza B by PCR: NEGATIVE
SARS Coronavirus 2 by RT PCR: NEGATIVE

## 2021-07-10 IMAGING — DX DG CHEST 1V PORT
1 series · 1 of 1 positions shown · non-contrast
Comparison: Chest x-ray [DATE]

CLINICAL DATA: Weakness

EXAM:
PORTABLE CHEST 1 VIEW

[chest]
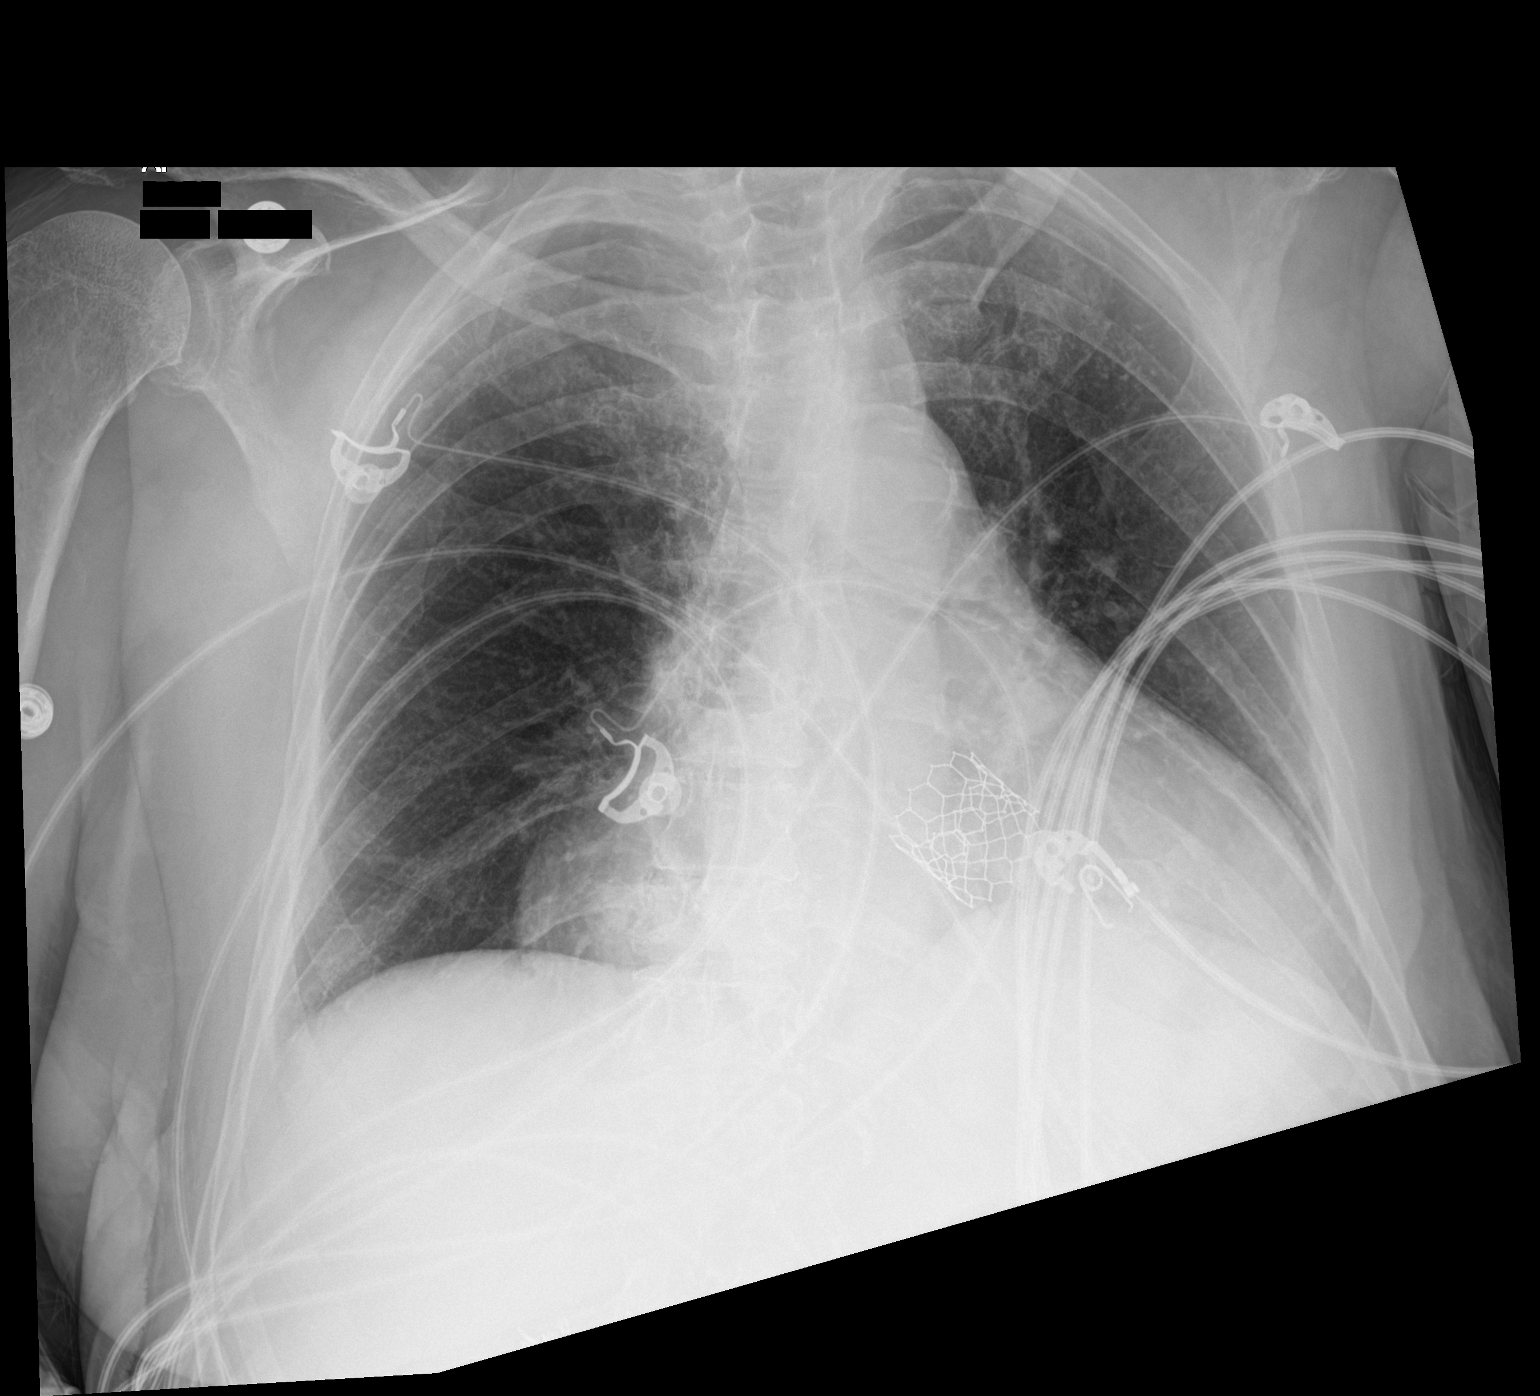

[1 of 1 positions shown; findings below may reference images not displayed]

FINDINGS: Heart is enlarged. Mediastinum appears stable. Calcified plaques in
the aortic arch. Aortic valve prosthesis. Pulmonary vasculature is
normal. No focal consolidation identified. No pleural effusion or
pneumothorax.
IMPRESSION: Cardiomegaly with no acute process identified.

## 2021-07-10 IMAGING — CT CT HEAD W/O CM
3 of 4 series · 13 of 47 positions shown, 15 images · non-contrast
Comparison: [DATE]

CLINICAL DATA: Recent subdural hematoma, increased confusion



[Series 3: head without · axial · non-contrast · 0.46mm/px · z∈[-141,-6]mm · 7 of 37 slices shown, 9 images]
[im 5/37  brain]
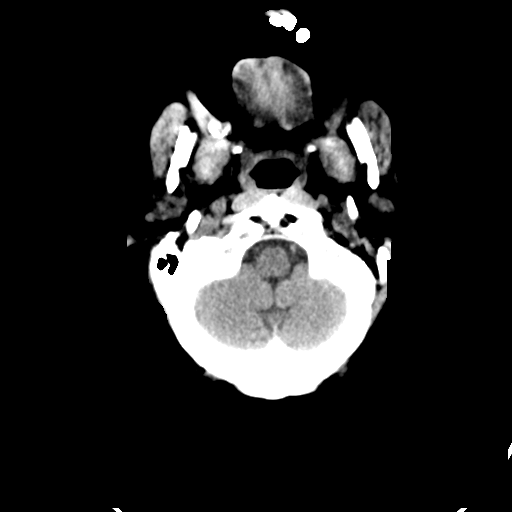
[im 5/37  bone]
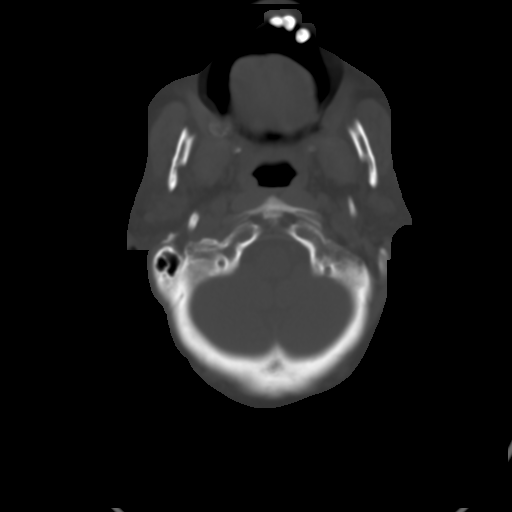
[im 10/37  brain]
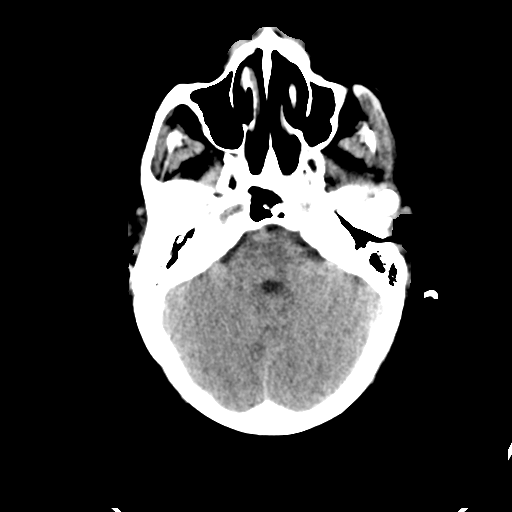
[im 14/37  brain]
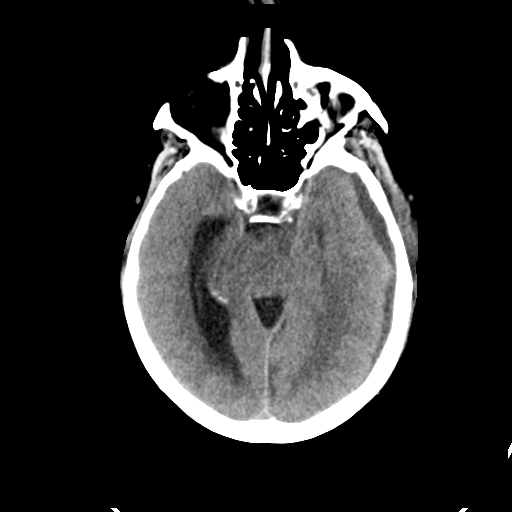
[im 19/37  brain]
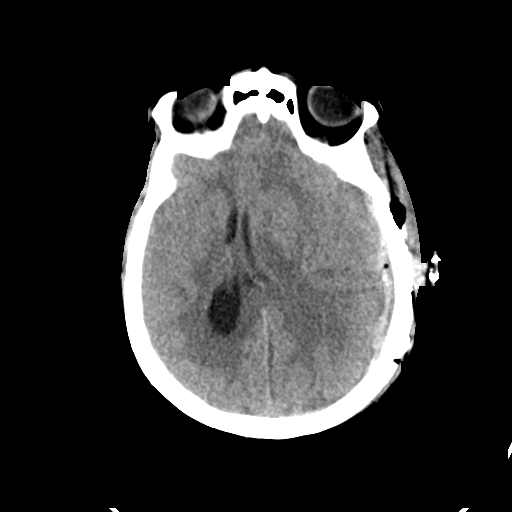
[im 23/37  brain]
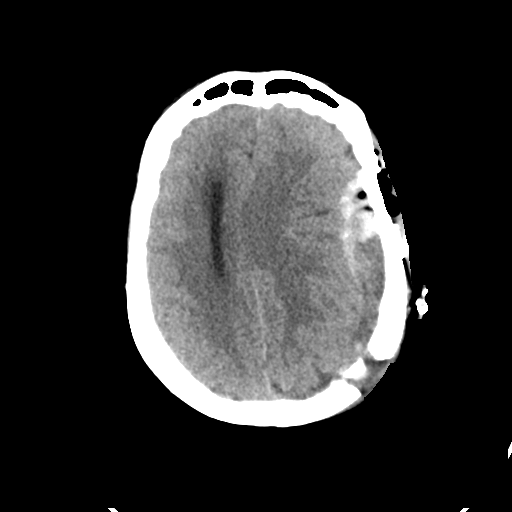
[im 23/37  bone]
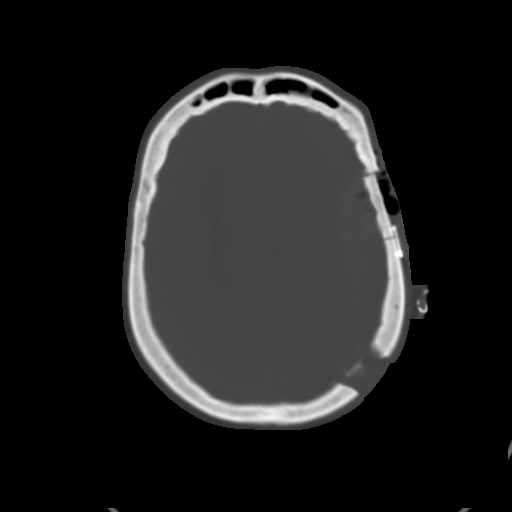
[im 28/37  brain]
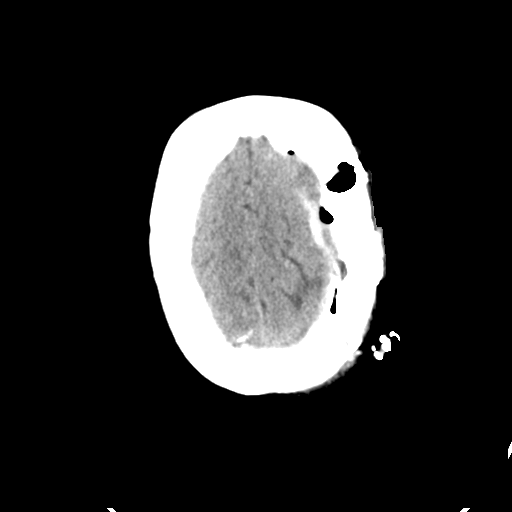
[im 32/37  brain]
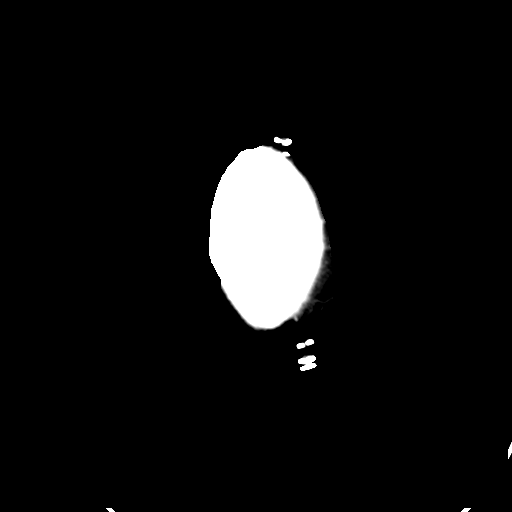

[Series 4: head without cor · coronal · non-contrast · 0.35mm/px · 3 of 67 slices shown]
[im 23/67  brain]
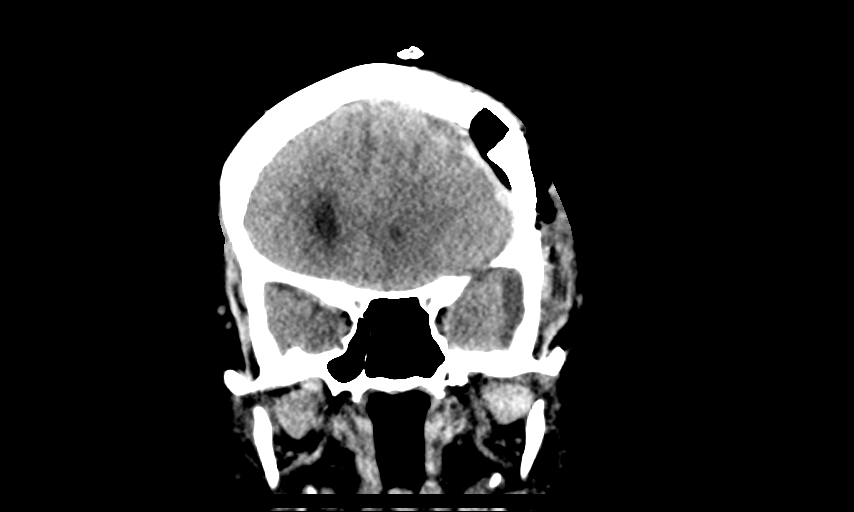
[im 30/67  brain]
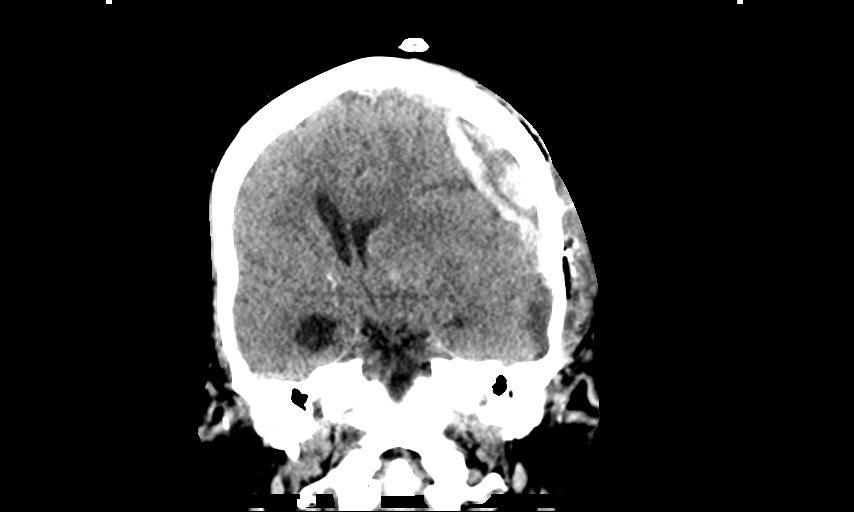
[im 37/67  brain]
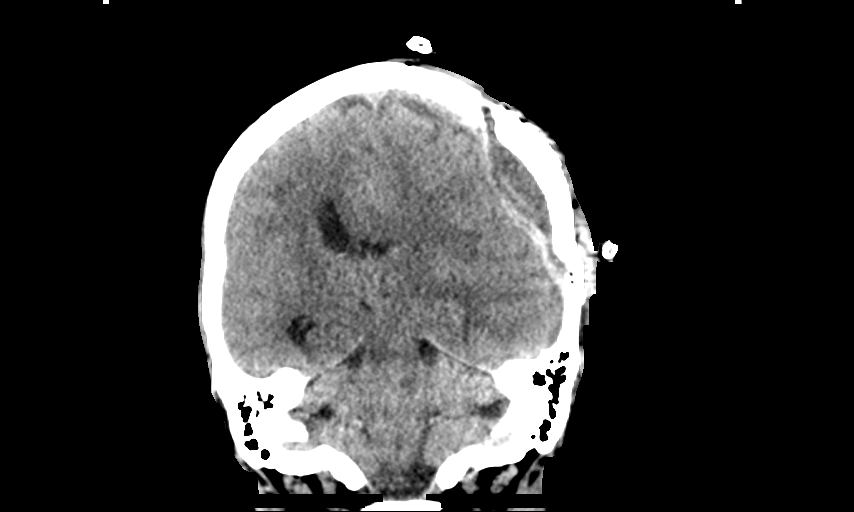

[Series 5: head without sag · sagittal · non-contrast · 0.35mm/px · 3 of 56 slices shown]
[im 19/56  brain]
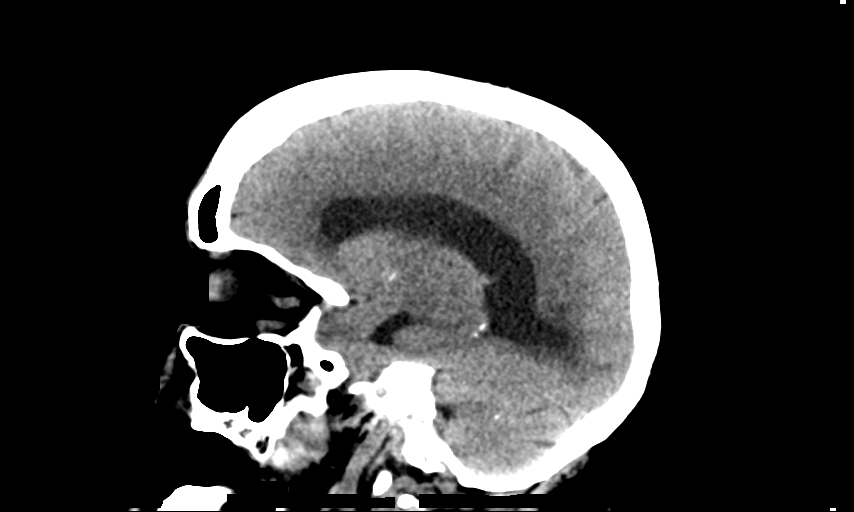
[im 28/56  brain]
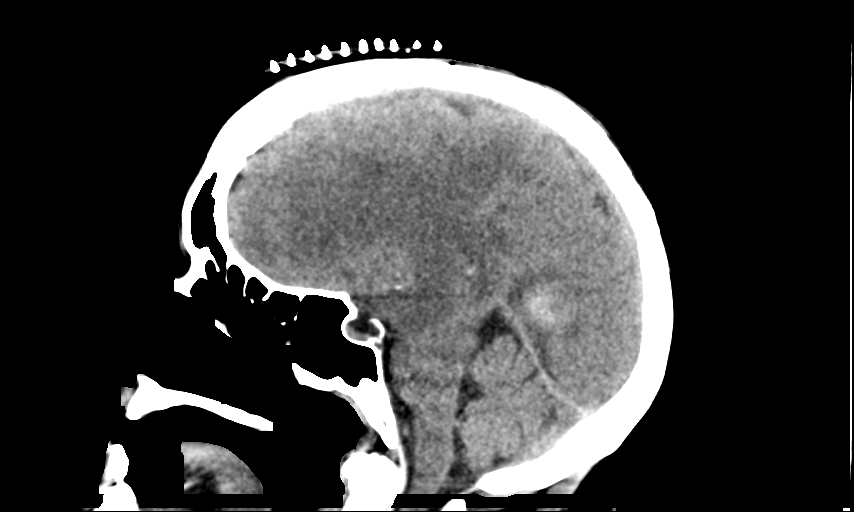
[im 37/56  brain]
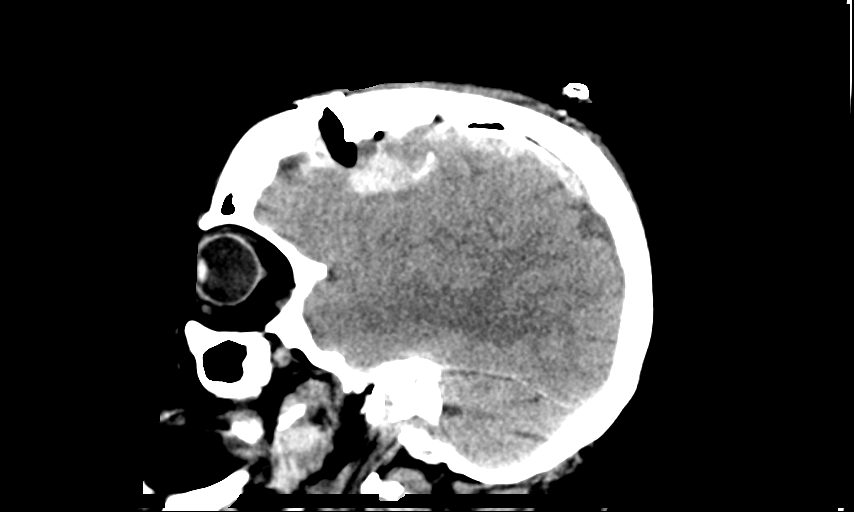

[13 of 47 positions shown; findings below may reference images not displayed]

FINDINGS: Brain: No evidence of acute infarction or hydrocephalus.
Redemonstrated mixed attenuation left hemispheric subdural hematoma,
which is unchanged in maximum thickness at 1.5 cm, however decreased
in overall volume about the left hemisphere. There is however new
hyperdense, acute appearing subdural blood product overlying the
posterior left frontal lobe and parietal lobe (series 4, image 30).
Postoperative pneumocephalus. Severe left right midline shift is not
significantly changed at 1.4 cm (series 3, image 20). Underlying
periventricular and deep white matter hypodensity.

Vascular: No hyperdense vessel or unexpected calcification.

Skull: Status post left frontoparietal craniotomy. Negative for
fracture or focal lesion.

Sinuses/Orbits: No acute finding.

Other: None.
IMPRESSION: 1. Redemonstrated mixed attenuation left hemispheric subdural
hematoma, which is unchanged in maximum thickness at 1.5 cm, however
decreased in overall volume about the left hemisphere following
craniotomy.
2. There is however new hyperdense, acute appearing subdural blood
product overlying the posterior left frontal lobe and parietal lobe,
consistent with recurrent hemorrhage.
3. Severe left right midline shift is not significantly changed at
1.4 cm.

These results were called by telephone at the time of interpretation
on [DATE] at [DATE] to Dr. GODREAU , who verbally
acknowledged these results.

## 2021-07-10 MED ORDER — DEXTROSE 10 % IV SOLN: INTRAVENOUS | Status: AC

## 2021-07-10 NOTE — Hospital Course (Addendum)
Presented to [facility] atrium on 02/02 after sustaining injuries from a fall.  She fell while walking outside her mailbox.  Her injuries were a left orbital floor fracture, subdural hematoma with a 5 mm shift, acute on chronic T8 fracture and a nondisplaced right mandibular subcondylar fracture.  Trauma as well as neurosurgery were consulted.  ENT was consulted for her mandibular fracture and ophthalmology was consulted for an orbital fracture.  All of her fractures were managed nonoperatively.  During this time she was started on Keppra for seizure prophylaxis cleared for DVT prophylaxis but held and aspirin until she followed up with neurosurgery.  At time of discharge she was hemodynamically stable on room air and discharged home with follow-up with ophthalmology ENT and neurosurgery in the outpatient setting.  Patient was cleared to discharge home with occupational and physical therapy and 24/7 support.  After discharge the patient reported to [facility] emergency department in [place] after she had a fall while at home and hitting her head.  After hitting her head the patient's family noted that she was altered and not responding or speaking much.  She was transferred from [facility] back to [facility] where she was found to have an enlargement of the left subacute subdural hematoma that measured to 1.6 cm with a 1.4 cm midline shift.  At this time she underwent left craniotomy for evacuation.  On 2/12 she was found to have worsening aphasia.  There was a reaccumulation of her subdural hematoma with increased MLS requiring a repeat left craniotomy for evacuation.  Postoperatively neuro exam [address] poor and there is ongoing goals of care discussions with the family.  They did not want to pursue any further interventions and decided to transition to comfort care and hospice.  Surgical drain was removed and she was transition to comfort care.  After being discharged to the hospice facility on 2/14  the patient's family members noted that over the past 2 days she has had some recovery being able to wake up, move her left arm some and able to nod her head yes or no to certain questions.  Physical exam Neurological -alert but nonverbal.  Nods head yes or no but uncertain if she understands what is being asked.  Pupils are equal round reactive to light.  Unable to follow commands.  Unable to move extremities on command.  Her right upper extremity, left lower extremity and right lower extremity she has no movement.  She has no gag reflex present.  Nonverbal. HENT - Surgical sites (describe them), staples in place, Clean dry and intact. No erythema, hematoma.   A/P  Subdural hematoma status post craniotomy x2  On 2/1 she was found to have a SDH that was managed medically and she was discharged home. She had another fall on 2/8 and presented to the ED again with AMS. On 2/8 she had a craniotomy for evacuation and had some recovery. 2/12 she had worsening aphasia and had re-accumulation of the SDH with a mid line shift requiring another left craniotomy for evacuation. Her neurological exam continued to decline. It was to the point where she was no longer responsive and the family decided to pursue hospice. Presented to the [facility] ED from hospice facility after family observed improvement in neurological function. They are hopeful that she will continue to improve despite her persistent SDH's. Discussed goals of care and eldest daughter would like patient to be partial code with defibrillation if needed and ACLS medications. Does not  want intubation or CPR to be initiated. She would like her mother placed in a long term care facility if possible.  -Neurosurgery and Palliative Care Consulted -Continue IV dextrose 10% infusion 75 ml/hr -Consider PT/OT/Speech pending consults above.   Aortic Stensosi HTN - You wrote about comfort care, but we aren't sure what they are pursuing yet until we have a  better understanding of her prognosis.   Anemia ****she has hematomas.

## 2021-07-10 NOTE — H&P (Addendum)
Date: 07/10/2021               Patient Name:  Denise Page MRN: [id number]  DOB: 05/11/1941 Age / Sex: 81 y.o., female   PCP: Rosalee Kaufman, PA-C         Medical Service: Internal Medicine Teaching Service         Attending Physician: Dr. Lucious Groves, DO    First Contact: Timothy Lasso, MD Pager: SA [phone]  Second Contact: Sanjuana Letters, DO Pager: Maryjean Morn [phone]       After Hours (After 5p/  First Contact Pager: [phone]  weekends / holidays): Second Contact Pager: [phone]   SUBJECTIVE   Chief Complaint: Palliative management  History of Present Illness:   Denise Page is a 81 y.o. female with a complicated PMHx with daughter is at the bedside providing history. In summary, Patient is unable to speak at baseline. Patient was recently admitted to [facility] where she had multiple craniotomies for subdural hematomas. Ultimately she was placed in hospice care with a DNR/ DNI.  She was obtunded with no physical movement or ability to communicate.  She has been at hospice for the last 3 to 4 days.  However since yesterday she is now more alert and following commands.  She is able to slightly move her upper extremities.  Family does not want her to be in hospice.  They want to pursue feeding tube.  They want her to remain DNR and DNI.  But they would like her to be back on medications, feeding tube/nutrition. They have taken her out of hospice and brought her here.  They do not prefer to go back to [facility] at this time. The history is provided by a eldest daughter. Mother became more awake and family would like to save her.  Yesterday patient opened up one eye and was yawning frequently. This morning patient was awake and nodding head in response to questions. She was also opening up both eyes.    In full story and chart review: Patient presented to [facility] on 02/02 after sustaining injuries from a fall.  She fell while walking outside her mailbox.  Her  injuries were a left orbital floor fracture, subdural hematoma with a 5 mm shift, acute on chronic T8 fracture and a nondisplaced right mandibular subcondylar fracture.  Trauma as well as neurosurgery were consulted.  ENT was consulted for her mandibular fracture and ophthalmology was consulted for an orbital fracture.  All of her fractures were managed nonoperatively.  During this time she was started on Keppra for seizure prophylaxis cleared for DVT prophylaxis but held and aspirin until she followed up with neurosurgery.  At time of discharge she was hemodynamically stable on room air and discharged home with follow-up with ophthalmology ENT and neurosurgery in the outpatient setting.  Patient was cleared to discharge home with occupational and physical therapy and 24/7 support.  After discharge the patient reported to [facility] emergency department in [place] after she had a fall while at home and hitting her head.  After hitting her head the patient's family noted that she was altered and not responding or speaking much.  She was transferred from [facility] back to [facility] where she was found to have an enlargement of the left subacute subdural hematoma that measured to 1.6 cm with a 1.4 cm midline shift.  At this time she underwent left craniotomy for evacuation.  On 2/12 she was found to have worsening aphasia.  There was a reaccumulation of her subdural hematoma with increased MLS requiring a repeat left craniotomy for evacuation.  Postoperatively neuro exam [address] poor and there is ongoing goals of care discussions with the family.  They did not want to pursue any further interventions and decided to transition to comfort care and hospice.  Surgical drain was removed and she was transition to comfort care.  After being discharged to the hospice facility on 2/14 the patient's family members noted that over the past 2 days she has had some recovery being able to wake up, move her left arm  some and able to nod her head yes or no to certain questions.  ED course: Patient was started on IV dextrose 10% continuous infusion 75 mL/h and CT head was obtained revealing recurrent hemorrhage.  Chest x-ray revealed cardiomegaly.  Meds:  None at this time  PMHx Hiatal Hernia Bleeding Gastric Ulcers Aortic Stenosis s/p aortic valve replacement 11/22 Hypothyroidism  HLD HTN Traumatic Hematoma Facial Fractures  Past Surgical History:  Procedure Laterality Date   BIOPSY  08/01/2020   Procedure: BIOPSY;  Surgeon: Ronnette Juniper, MD;  Location: [facility] ENDOSCOPY;  Service: Gastroenterology;;  EGD and COLON   CHOLECYSTECTOMY     COLONOSCOPY N/A 08/01/2020   Procedure: COLONOSCOPY;  Surgeon: Ronnette Juniper, MD;  Location: [facility] ENDOSCOPY;  Service: Gastroenterology;  Laterality: N/A;   CYSTECTOMY     ESOPHAGOGASTRODUODENOSCOPY (EGD) WITH PROPOFOL N/A 08/01/2020   Procedure: ESOPHAGOGASTRODUODENOSCOPY (EGD) WITH PROPOFOL;  Surgeon: Ronnette Juniper, MD;  Location: [facility] ENDOSCOPY;  Service: Gastroenterology;  Laterality: N/A;   POLYPECTOMY  08/01/2020   Procedure: POLYPECTOMY;  Surgeon: Ronnette Juniper, MD;  Location: [facility] ENDOSCOPY;  Service: Gastroenterology;;   TEE WITHOUT CARDIOVERSION N/A 11/13/2020   Procedure: TRANSESOPHAGEAL ECHOCARDIOGRAM (TEE);  Surgeon: Josue Hector, MD;  Location: [facility] ENDOSCOPY;  Service: Cardiovascular;  Laterality: N/A;   TONSILLECTOMY     Social:  Lives With: Hospice Facility Occupation: Used to work as Therapist, 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    OBJECTIVE:   Physical Exam: Blood pressure (!) 159/67, pulse 90, temperature 99.6 F (37.6 C), temperature source Oral, resp. rate 18, SpO2 96 %.  Constitutional: elderly woman, no acute distress HENT: normocephalic atraumatic, mucous membranes moist Eyes: conjunctiva non-erythematous Neck: supple Cardiovascular: regular rate and rhythm, systolic murmur loudest at apex Pulmonary/Chest: normal work of breathing on room air, lungs clear to auscultation bilaterally Abdominal: soft, non-tender, non-distended MSK: normal bulk and tone Neurological: alert but nonverbal.  Nods head yes or no but uncertain if she understands what is being asked.  Pupils are equal round reactive to light.  Unable to follow commands.  Unable to move extremities on command.  Her right upper extremity, left lower extremity and right lower extremity she has no movement. Able to lift left arm only. She has no gag reflex present.  Skin: warm and dry Psych: nonverbal; nods heads sometimes  Labs: CBC    Component Value Date/Time   WBC 4.7 07/10/2021 1358   RBC 3.18 (L) 07/10/2021 1358   HGB 9.0 (  L) 07/10/2021 1358   HCT 28.0 (L) 07/10/2021 1358   HCT 35.0 09/30/2020 1215   PLT 250 07/10/2021 1358   MCV 88.1 07/10/2021 1358   MCH 28.3 07/10/2021 1358   MCHC 32.1 07/10/2021 1358   RDW 13.8 07/10/2021 1358   LYMPHSABS 1.1 07/10/2021 1358   MONOABS 1.0 07/10/2021 1358   EOSABS 0.0 07/10/2021 1358   BASOSABS 0.0 07/10/2021 1358     CMP     Component Value Date/Time   NA 139 07/10/2021 1358   K 3.8 07/10/2021 1358   CL 107 07/10/2021 1358   CO2 18 (L) 07/10/2021 1358   GLUCOSE 82 07/10/2021 1358   BUN 13 07/10/2021 1358   CREATININE 0.84 07/10/2021 1358   CREATININE 1.26 (H) 10/21/2020 1517   CALCIUM 8.5 (L) 07/10/2021 1358   PROT 6.4 (L) 07/10/2021 1358   ALBUMIN 3.1 (L) 07/10/2021 1358   AST 24 07/10/2021 1358   AST 13 (L) 10/21/2020 1517   ALT 21 07/10/2021 1358   ALT 11 10/21/2020 1517    ALKPHOS 96 07/10/2021 1358   BILITOT 1.0 07/10/2021 1358   BILITOT 0.9 10/21/2020 1517   GFRNONAA >60 07/10/2021 1358   GFRNONAA 43 (L) 10/21/2020 1517    Imaging: CT Head Wo Contrast  Result Date: 07/10/2021 CLINICAL DATA:  Recent subdural hematoma, increased confusion EXAM: CT HEAD WITHOUT CONTRAST TECHNIQUE: Contiguous axial images were obtained from the base of the skull through the vertex without intravenous contrast. RADIATION DOSE REDUCTION: This exam was performed according to the departmental dose-optimization program which includes automated exposure control, adjustment of the mA and/or kV according to patient size and/or use of iterative reconstruction technique. COMPARISON:  07/01/2021 FINDINGS: Brain: No evidence of acute infarction or hydrocephalus. Redemonstrated mixed attenuation left hemispheric subdural hematoma, which is unchanged in maximum thickness at 1.5 cm, however decreased in overall volume about the left hemisphere. There is however new hyperdense, acute appearing subdural blood product overlying the posterior left frontal lobe and parietal lobe (series 4, image 30). Postoperative pneumocephalus. Severe left right midline shift is not significantly changed at 1.4 cm (series 3, image 20). Underlying periventricular and deep white matter hypodensity. Vascular: No hyperdense vessel or unexpected calcification. Skull: Status post left frontoparietal craniotomy. Negative for fracture or focal lesion. Sinuses/Orbits: No acute finding. Other: None. IMPRESSION: 1. Redemonstrated mixed attenuation left hemispheric subdural hematoma, which is unchanged in maximum thickness at 1.5 cm, however decreased in overall volume about the left hemisphere following craniotomy. 2. There is however new hyperdense, acute appearing subdural blood product overlying the posterior left frontal lobe and parietal lobe, consistent with recurrent hemorrhage. 3. Severe left right midline shift is not  significantly changed at 1.4 cm. These results were called by telephone at the time of interpretation on 07/10/2021 at 3:03 pm to Dr. Gareth Morgan , who verbally acknowledged these results. Electronically Signed   By: Delanna Ahmadi M.D.   On: 07/10/2021 15:09   DG Chest Portable 1 View  Result Date: 07/10/2021 CLINICAL DATA:  Weakness EXAM: PORTABLE CHEST 1 VIEW COMPARISON:  Chest x-ray 11/20/2020 FINDINGS: Heart is enlarged. Mediastinum appears stable. Calcified plaques in the aortic arch. Aortic valve prosthesis. Pulmonary vasculature is normal. No focal consolidation identified. No pleural effusion or pneumothorax. IMPRESSION: Cardiomegaly with no acute process identified. Electronically Signed   By: Ofilia Neas M.D.   On: 07/10/2021 16:41    EKG: personally reviewed my interpretation is normal sinus rhythm.   ASSESSMENT & PLAN:    Assessment &  Plan by Problem: Principal Problem:   Subdural hematoma   Denise Page is a 81 y.o. with pertinent PMH of subdural hematoma status post 2 craniotomies.  Who presented from hospice and admitted for palliative management on hospital day 0.  #Subdural hematoma s/p craniotomy #Previous hospice care Patient underwent 2 craniotomies for subdural hematoma after sustaining a mechanical fall.  It was determined at [facility] hospice care due to poor prognosis, patient became unresponsive to all stimuli. Family endorsed, once at hospice, a few days later, family noted some neurologic improvement including partial movement of the left arm, eyes open and nodding of the head. Though, patient is still  nonverbal. CT head shows new hyperdense, acute appearing subdural blood product overlying the posterior left frontal lobe and parietal lobe, consistent with recurrent hemorrhage.  As well as Redemonstrated mixed attenuation left hemispheric subdural hematoma, which is unchanged in maximum thickness at 1.5 cm, however decreased in overall volume about the  left hemisphere following craniotomy. Discussed goals of care and eldest daughter would like patient to be partial code with defibrillation if needed and ACLS medications. Does not want intubation or CPR to be initiated. She would like her mother placed in a long term care facility if possible. -- Neurosurgery has been consulted and palliative care has been consulted.   --Continuous IV dextrose 10% infusion 75 mL/h -- Pending neurosurgery recommendations --Pending palliative care evaluation --Consult PT/OT in the a.m. --Obtain SLP in the a.m.  #Normocytic anemia CBC obtained in the ED significant for hemoglobin of 9; MCV 88.  Per family, patient has history of chronic anemia.  CT head confirms re-bleeding of hematoma. Currently not on any medication, no anticoagulation. -- Stable; continue to monitor  #Aortic Stenosis s/p aortic valve replacement 11/22 #HTN On admission, patient vitals are stable.  Patient previously from hospice and currently not on any medications, no anticoagulation.  Systolic murmur auscultated at the apex.  Chest x-ray significant for cardiomegaly; no active cardiopulmonary disease.    Diet: NPO VTE:  None IVF: D10,75cc/hr Code: Partial Code  Prior to Admission Living Arrangement: Hospice Anticipated Discharge Location: Long term facility Barriers to Discharge: No disposition   Dispo: Admit patient to Inpatient with expected length of stay greater than 2 midnights.  Signed: Timothy Lasso, MD Internal Medicine Resident PGY-1 Pager: [phone]  07/10/2021, 9:33 PM

## 2021-07-10 NOTE — ED Provider Notes (Signed)
[facility] EMERGENCY DEPARTMENT Provider Note   CSN: [id number] Arrival date & time: 07/10/21  1336     History  Chief Complaint  Patient presents with   Altered Mental Status    Denise Page is a 81 y.o. female.  Level 5 caveat as patient is unable to speak at baseline.  Complicated recent past medical history.  Daughter is at the bedside providing history.  Patient was recently admitted to [facility] where she had multiple craniotomies for subdural hematomas.  Ultimately she was placed in hospice care with a DNR DNI.  She was obtunded with no physical movement or ability to communicate.  She has been at hospice for the last 3 to 4 days.  However since yesterday she is now more alert and following commands.  She is able to slightly move her upper extremities.  Family does not want her to be in hospice.  They want to pursue feeding tube.  They want her to remain DNR and DNI.  But they would like her to be back on medications, feeding tube/nutrition.  They have taken her out of hospice and brought her here.  They do not prefer to go back to [facility] at this time.  The history is provided by a caregiver.  Illness     Home Medications Prior to Admission medications   Medication Sig Start Date End Date Taking? Authorizing Provider  acetaminophen (TYLENOL) 650 MG CR tablet Take 650 mg by mouth every 8 (eight) hours as needed for pain.    [provider]  ALPRAZolam Duanne Moron) 0.5 MG tablet Take 0.5 mg by mouth 2 (two) times daily.    [provider]  aspirin EC 81 MG tablet Take 81 mg by mouth in the morning. Swallow whole.    [provider]  calcitonin, salmon, (MIACALCIN/FORTICAL) 200 UNIT/ACT nasal spray Place 1 spray into alternate nostrils daily as needed (pain). 06/11/20   [provider]  carvedilol (COREG) 6.25 MG tablet TAKE 1 TABLET BY MOUTH TWICE DAILY with a meal 06/12/21   Minus Breeding, MD  cephALEXin (KEFLEX) 500  MG capsule Take 1 capsule (500 mg total) by mouth 4 (four) times daily. 05/11/21   Milton Ferguson, MD  escitalopram (LEXAPRO) 5 MG tablet Take 5 mg by mouth at bedtime.    [provider]  ferrous sulfate 325 (65 FE) MG tablet Take 325 mg by mouth in the morning and at bedtime.    [provider]  folic acid (FOLVITE) 1 MG tablet Take 1 mg by mouth in the morning.    [provider]  levothyroxine (SYNTHROID) 25 MCG tablet Take 25 mcg by mouth every morning. 10/06/20   [provider]  lisinopril (ZESTRIL) 5 MG tablet TAKE 1 TABLET BY MOUTH EVERY DAY 05/07/21   Minus Breeding, MD  pantoprazole (PROTONIX) 40 MG tablet Take 40 mg by mouth 2 (two) times daily.    [provider]  rosuvastatin (CRESTOR) 5 MG tablet TAKE 1 TABLET BY MOUTH EVERY DAY 05/04/21   Minus Breeding, MD  sucralfate (CARAFATE) 1 g tablet Take 1 g by mouth 2 (two) times daily.    [provider]  vitamin B-12 (CYANOCOBALAMIN) 1000 MCG tablet Take 1,000 mcg by mouth in the morning.    [provider]      Allergies    Morphine and related, Sulfa antibiotics, Codeine, and Tramadol    Review of Systems   Review of Systems  Physical Exam Updated  Vital Signs BP (!) 149/70    Pulse 84    Temp 99.6 F (37.6 C) (Oral)    Resp 17    SpO2 98%  Physical Exam Vitals and nursing note reviewed.  Constitutional:      General: She is not in acute distress.    Appearance: She is well-developed. She is ill-appearing.  HENT:     Head: Normocephalic and atraumatic.     Comments: Craniotomy site appears clean, dry, intact Eyes:     Conjunctiva/sclera: Conjunctivae normal.  Cardiovascular:     Rate and Rhythm: Normal rate and regular rhythm.     Heart sounds: No murmur heard. Pulmonary:     Effort: Pulmonary effort is normal. No respiratory distress.     Breath sounds: Normal breath sounds.  Abdominal:     Palpations: Abdomen is soft.     Tenderness: There is no  abdominal tenderness.  Musculoskeletal:        General: No swelling.     Cervical back: Neck supple.  Skin:    General: Skin is warm and dry.     Capillary Refill: Capillary refill takes less than 2 seconds.  Neurological:     Mental Status: She is alert.     Comments: Severe weakness in upper and lower extremities but she is able to raise her upper extremity slightly, she is able to shake her head yes and no to questions.  She is unable to speak.  Psychiatric:        Mood and Affect: Mood normal.    ED Results / Procedures / Treatments   Labs (all labs ordered are listed, but only abnormal results are displayed) Labs Reviewed  CBC WITH DIFFERENTIAL/PLATELET - Abnormal; Notable for the following components:      Result Value   RBC 3.18 (*)    Hemoglobin 9.0 (*)    HCT 28.0 (*)    nRBC 0.4 (*)    Abs Immature Granulocytes 0.46 (*)    All other components within normal limits  COMPREHENSIVE METABOLIC PANEL - Abnormal; Notable for the following components:   CO2 18 (*)    Calcium 8.5 (*)    Total Protein 6.4 (*)    Albumin 3.1 (*)    All other components within normal limits  URINE CULTURE  RESP PANEL BY RT-PCR (FLU A&B, COVID) [id number]  URINALYSIS, ROUTINE W REFLEX MICROSCOPIC    EKG EKG Interpretation  Date/Time:  Friday July 10 2021 13:50:19 EST Ventricular Rate:  84 PR Interval:  148 QRS Duration: 91 QT Interval:  394 QTC Calculation: 466 R Axis:   33 Text Interpretation: Sinus rhythm Nonspecific repol abnormality, diffuse leads Confirmed by Lennice Sites ([id number]) on 07/10/2021 4:44:33 PM  Radiology CT Head Wo Contrast  Result Date: 07/10/2021 CLINICAL DATA:  Recent subdural hematoma, increased confusion EXAM: CT HEAD WITHOUT CONTRAST TECHNIQUE: Contiguous axial images were obtained from the base of the skull through the vertex without intravenous contrast. RADIATION DOSE REDUCTION: This exam was performed according to the departmental dose-optimization program  which includes automated exposure control, adjustment of the mA and/or kV according to patient size and/or use of iterative reconstruction technique. COMPARISON:  07/01/2021 FINDINGS: Brain: No evidence of acute infarction or hydrocephalus. Redemonstrated mixed attenuation left hemispheric subdural hematoma, which is unchanged in maximum thickness at 1.5 cm, however decreased in overall volume about the left hemisphere. There is however new hyperdense, acute appearing subdural blood product overlying the posterior left frontal lobe and parietal lobe (series 4,  image 30). Postoperative pneumocephalus. Severe left right midline shift is not significantly changed at 1.4 cm (series 3, image 20). Underlying periventricular and deep white matter hypodensity. Vascular: No hyperdense vessel or unexpected calcification. Skull: Status post left frontoparietal craniotomy. Negative for fracture or focal lesion. Sinuses/Orbits: No acute finding. Other: None. IMPRESSION: 1. Redemonstrated mixed attenuation left hemispheric subdural hematoma, which is unchanged in maximum thickness at 1.5 cm, however decreased in overall volume about the left hemisphere following craniotomy. 2. There is however new hyperdense, acute appearing subdural blood product overlying the posterior left frontal lobe and parietal lobe, consistent with recurrent hemorrhage. 3. Severe left right midline shift is not significantly changed at 1.4 cm. These results were called by telephone at the time of interpretation on 07/10/2021 at 3:03 pm to Dr. Gareth Morgan , who verbally acknowledged these results. Electronically Signed   By: Delanna Ahmadi M.D.   On: 07/10/2021 15:09   DG Chest Portable 1 View  Result Date: 07/10/2021 CLINICAL DATA:  Weakness EXAM: PORTABLE CHEST 1 VIEW COMPARISON:  Chest x-ray 11/20/2020 FINDINGS: Heart is enlarged. Mediastinum appears stable. Calcified plaques in the aortic arch. Aortic valve prosthesis. Pulmonary vasculature is  normal. No focal consolidation identified. No pleural effusion or pneumothorax. IMPRESSION: Cardiomegaly with no acute process identified. Electronically Signed   By: Ofilia Neas M.D.   On: 07/10/2021 16:41    Procedures Procedures    Medications Ordered in ED Medications  dextrose 10 % infusion ( Intravenous New Bag/Given 07/10/21 1618)    ED Course/ Medical Decision Making/ A&P                           Medical Decision Making Amount and/or Complexity of Data Reviewed Labs: ordered. Radiology: ordered.  Risk Prescription drug management. Decision regarding hospitalization.   Denise Page is an 81 year old female with history of hypertension, high cholesterol who presents to the ED with need for feeding tube, placement.  Overall recent complex medical history.  Daughter is at the bedside giving me history but I have also done chart review from her recent hospital stay at [facility].  Patient recently suffered subdural hematomas and had craniotomy x2.  She continued to have some recurrent head bleeds and ultimately decision was made not to take her to the OR or do any further treatment as family did not want her to be on long-term vent.  She was ultimately discharged to hospice where she has been the last several days however over the last day she has improved.  When she was first at hospice she was obtunded unable to speak, unable to follow commands, globally weak.  Over the last day she is now following commands and able to nod her head yes and no to questions.  She was discharged without a feeding tube did have a Foley catheter.  Family has now taken her out of hospice and brought her here because they prefer treatment here over [facility]  They do not want to pursue any other surgical interventions but would like her to have feeding tube and different level of care.  They do want her to remain DNR and DNI.  They do not want her to be put on the ventilator or have her chronic  events.  Basic screening labs including CBC, CMP, chest x-ray, urinalysis, COVID test has been obtained.  Repeat head CT obtained.  She is not on blood thinners.  She has been on any medications.  She has not had any nutrition in the last several days.  We will place her on a D10 infusion.  Per radiology review of CT scan patient does have redemonstrated left subdural hematoma.  There is may be new bleeding in the left frontal lobe and parietal lobe.  This is consistent with a recurrent hemorrhage.  She has left-to-right shift of about 1.4 cm.  However clinically she appears to be improved.  Ultimately we will touch base with neurosurgery but family does not really want to pursue any surgical intervention as they are concerned she would not be able to get off of the ventilator.  At this time lab work is actually per my review and interpretation unremarkable.  She does not have any significant electrolyte abnormalities or leukocytosis or anemia.  Chest x-ray shows no evidence of pneumonia.  Overall I believe admission is warranted for family to have a palliative care consult to further discuss goals of care.  They seem fairly adamant about pursuing feeding tube and long-term facility placement.  I will admit the patient to hospitalist.  I will let neurosurgery know about the patient as well.  Talked with Dr. Venetia Constable with neurosurgery who will follow along.  Patient to be admitted to medicine service for further care.  This chart was dictated using voice recognition software.  Despite best efforts to proofread,  errors can occur which can change the documentation meaning.        Final Clinical Impression(s) / ED Diagnoses Final diagnoses:  SDH (subdural hematoma)  Weakness    Rx / DC Orders ED Discharge Orders     None         Lennice Sites, DO 07/10/21 1727

## 2021-07-10 NOTE — Consult Note (Addendum)
Neurosurgery Consultation  Reason for Consult: SDH Referring Physician: Heber Bryant  CC: Subdural hematoma  HPI: This is a 81 y.o. woman that presents from hospice. She recently had a SDH with multiple evacuations / recurrences, was sent to hospice due to what sounds like poor neurologic exam / lack of improvement after evacuation. She then improved in hospice and her family asked for her to be transferred here.  Of note, she was discharged on Tuesday and was, per family, taking prophylactic keppra while in the hospital, which was stopped and then she improved roughly 2 days later. Given that the SDH hasn't changed, suspect this improvement is due to metabolism of the levetiracetam. No h/o seizures.   ROS: A 14 point ROS was performed and is negative except as noted in the HPI.   PMHx:  Past Medical History:  Diagnosis Date   Anemia    Aortic stenosis    Bilateral carotid artery disease (HCC)    Enlarged thyroid    Hyperlipidemia    Hypertension    NSTEMI (non-ST elevated myocardial infarction) ([place])    FamHx:  Family History  Problem Relation Age of Onset   Throat cancer Sister    Prostate cancer Brother    SocHx:  reports that she has never smoked. She has never used smokeless tobacco. No history on file for alcohol use and drug use.  Exam: Vital signs in last 24 hours: Temp:  [99.6 F (37.6 C)] 99.6 F (37.6 C) (02/17 1347) Pulse Rate:  [83-93] 93 (02/17 1815) Resp:  [14-23] 16 (02/17 1815) BP: (139-160)/(60-73) 155/63 (02/17 1815) SpO2:  [95 %-98 %] 95 % (02/17 1815) General: Awake, alert, cooperative, lying in bed in NAD Head: Normocephalic, L hemicrani incision c/d/I w/ staples HEENT: Neck supple Pulmonary: breathing room air comfortably, no evidence of increased work of breathing Cardiac: RRR Abdomen: S NT ND Extremities: Warm and well perfused x4 Neuro: Awake, globally aphasic, PERRL, gaze conjugate, right sided neglect versus weakness, pantomimes some on the left,  withdraws but doesn't localize on the right   Assessment and Plan: 81 y.o. woman s/p multiple SDH evac / recurrences at OSH. [facility] personally reviewed, which shows mixed density L SDH with some hyperdense material, likely acute on chronic hemorrhage, stable MLS from prior CTH at OSH.   -no acute neurosurgical intervention indicated at this time -discussed w/ the patient's family, the best explanation for her improvement is the keppra - would certainly not use any further prophylactic AEDs.  -will see if she continues to improve as the keppra metabolizes, if her family wants to pursue full treatment then, given her multiple recurrences, could be a candidate for endovascular MMA embolization - which would stop further hemorrhage but would not reduce the mass effect. But, overall, given her age and non-ambulatory status, I think statistically she has a poor overall long-term prognosis -please call with any concerns or questions  Judith Part, MD 07/10/21 9:06 PM [facility]  Addendum for CDI query response - pt's CT head had evidence of uncal herniation.

## 2021-07-10 NOTE — ED Triage Notes (Addendum)
PT BIB [facility] from [place]. Pt was sent there for 2 subdural hemastomas and unresponsiveness. Today pt opened her eyes and now family wants to withdraw hospice and get a 2nd opinion.

## 2021-07-11 DIAGNOSIS — Z7189 Other specified counseling: Secondary | ICD-10-CM

## 2021-07-11 DIAGNOSIS — R531 Weakness: Secondary | ICD-10-CM

## 2021-07-11 DIAGNOSIS — S065XAA Traumatic subdural hemorrhage with loss of consciousness status unknown, initial encounter: Secondary | ICD-10-CM | POA: Diagnosis not present

## 2021-07-11 DIAGNOSIS — Z789 Other specified health status: Secondary | ICD-10-CM

## 2021-07-11 DIAGNOSIS — Z515 Encounter for palliative care: Secondary | ICD-10-CM

## 2021-07-11 LAB — BASIC METABOLIC PANEL
Anion gap: 10 (ref 5–15)
BUN: 13 mg/dL (ref 8–23)
CO2: 20 mmol/L — ABNORMAL LOW (ref 22–32)
Calcium: 8.4 mg/dL — ABNORMAL LOW (ref 8.9–10.3)
Chloride: 106 mmol/L (ref 98–111)
Creatinine, Ser: 0.75 mg/dL (ref 0.44–1.00)
GFR, Estimated: >60 mL/min (ref >60)
Glucose, Bld: 159 mg/dL — ABNORMAL HIGH (ref 70–99)
Potassium: 3.2 mmol/L — ABNORMAL LOW (ref 3.5–5.1)
Sodium: 136 mmol/L (ref 135–145)

## 2021-07-11 LAB — CBC WITH DIFFERENTIAL/PLATELET
Abs Immature Granulocytes: 0.33 10*3/uL — ABNORMAL HIGH (ref 0.00–0.07)
Basophils Absolute: 0 10*3/uL (ref 0.0–0.1)
Basophils Relative: 0 %
Eosinophils Absolute: 0 10*3/uL (ref 0.0–0.5)
Eosinophils Relative: 1 %
HCT: 27.2 % — ABNORMAL LOW (ref 36.0–46.0)
Hemoglobin: 8.7 g/dL — ABNORMAL LOW (ref 12.0–15.0)
Immature Granulocytes: 6 %
Lymphocytes Relative: 21 %
Lymphs Abs: 1.2 10*3/uL (ref 0.7–4.0)
MCH: 27.7 pg (ref 26.0–34.0)
MCHC: 32 g/dL (ref 30.0–36.0)
MCV: 86.6 fL (ref 80.0–100.0)
Monocytes Absolute: 1.5 10*3/uL — ABNORMAL HIGH (ref 0.1–1.0)
Monocytes Relative: 27 %
Neutro Abs: 2.6 10*3/uL (ref 1.7–7.7)
Neutrophils Relative %: 45 %
Platelets: 268 10*3/uL (ref 150–400)
RBC: 3.14 MIL/uL — ABNORMAL LOW (ref 3.87–5.11)
RDW: 13.8 % (ref 11.5–15.5)
WBC: 5.6 10*3/uL (ref 4.0–10.5)
nRBC: 0 % (ref 0.0–0.2)

## 2021-07-11 LAB — TSH: TSH: 8.4 u[IU]/mL — ABNORMAL HIGH (ref 0.350–4.500)

## 2021-07-11 MED ORDER — ACETAMINOPHEN 325 MG PO TABS
650.0000 mg | ORAL_TABLET | Freq: Four times a day (QID) | ORAL | Status: DC | PRN
  Administered 2021-07-12 – 2021-07-16 (×2): 650 mg via ORAL
  Filled 2021-07-11 (×2): qty 2

## 2021-07-11 MED ORDER — ORAL CARE MOUTH RINSE
15.0000 mL | Freq: Two times a day (BID) | OROMUCOSAL | Status: DC
  Administered 2021-07-11 – 2021-07-15 (×6): 15 mL via OROMUCOSAL

## 2021-07-11 MED ORDER — CHLORHEXIDINE GLUCONATE CLOTH 2 % EX PADS
6.0000 | MEDICATED_PAD | Freq: Every day | CUTANEOUS | Status: DC
  Administered 2021-07-11 – 2021-07-16 (×6): 6 via TOPICAL

## 2021-07-11 MED ORDER — ACETAMINOPHEN 650 MG RE SUPP
650.0000 mg | RECTAL | Status: DC | PRN
  Administered 2021-07-11: 650 mg via RECTAL
  Filled 2021-07-11: qty 1

## 2021-07-11 NOTE — Evaluation (Signed)
Physical Therapy Evaluation Patient Details Name: Denise Page MRN: [id number] DOB: 11/11/1940 Today's Date: 07/11/2021  History of Present Illness  81 y.o. female presents to [facility] hospital on 07/10/2021 from hospice after recent SDH with multiple evacuations and recurrences while admitted at outside hospital. Pt with neurological improvement at hospice once taken off keppra. CTH demonstrates mixed density L SDH with some hyperdense material, likely acute on chronic hemorrhage, stable MLS from prior [place] at OSH. PMH includes gastric ulcers, HLD, HTN, facial fxs, AVR.  Clinical Impression  Pt presents to PT with deficits in functional mobility, communication, cognition, balance, strength. Pt demonstrates expressive and receptive aphasias with difficulty following commands. Pt does demonstrate strength in RUE to pull forward in an effort to correct balance errors. Pt requires significant physical assistance to perform all functional mobility activities at this time, however per family report her level of arousal and attention seem to be improving still. Pt will benefit from continued acute PT services in an effort to reduce caregiver burden and falls risk.     Recommendations for follow up therapy are one component of a multi-disciplinary discharge planning process, led by the attending physician.  Recommendations may be updated based on patient status, additional functional criteria and insurance authorization.  Follow Up Recommendations Skilled nursing-short term rehab (<3 hours/day)    Assistance Recommended at Discharge Frequent or constant Supervision/Assistance  Patient can return home with the following  Two people to help with walking and/or transfers;Two people to help with bathing/dressing/bathroom;Assistance with cooking/housework;Assistance with feeding;Direct supervision/assist for medications management;Direct supervision/assist for financial management;Assist for transportation;Help with  stairs or ramp for entrance    Equipment Recommendations  (TBD)  Recommendations for Other Services       Functional Status Assessment Patient has had a recent decline in their functional status and demonstrates the ability to make significant improvements in function in a reasonable and predictable amount of time.     Precautions / Restrictions Precautions Precautions: Fall Restrictions Weight Bearing Restrictions: No      Mobility  Bed Mobility Overal bed mobility: Needs Assistance Bed Mobility: Supine to Sit, Sit to Supine, Rolling Rolling: Max assist   Supine to sit: Max assist, HOB elevated Sit to supine: Total assist, HOB elevated        Transfers                        Ambulation/Gait                  Stairs            Wheelchair Mobility    Modified Rankin (Stroke Patients Only) Modified Rankin (Stroke Patients Only) Pre-Morbid Rankin Score: Slight disability Modified Rankin: Severe disability     Balance                                             Pertinent Vitals/Pain Pain Assessment Pain Assessment: Faces Faces Pain Scale: Hurts a little bit Pain Location: RLE Pain Descriptors / Indicators: Grimacing Pain Intervention(s): Monitored during session    Home Living Family/patient expects to be discharged to:: Unsure                   Additional Comments: pt was living with son in a one level home, 3 steps to enter with R railing.    Prior Function Prior  Level of Function : Independent/Modified Independent             Mobility Comments: pt ambulated without assistive device, fell going to mailbox which resulted in initial SDH       Hand Dominance   Dominant Hand: Left    Extremity/Trunk Assessment   Upper Extremity Assessment Upper Extremity Assessment: RUE deficits/detail RUE Deficits / Details: pt demonstrates 3/5 grip strenght, 4-/5 elbow flexors and shoulder extensors when  attempting to pull to avoid posterior LOB. Difficult to formally assess 2/2 cognitive and communication deficits    Lower Extremity Assessment Lower Extremity Assessment: RLE deficits/detail RLE Deficits / Details: RLE with flickers of movement from quads and dorsiflexors noted. Difficult to formally assess due to communication deficits.    Cervical / Trunk Assessment Cervical / Trunk Assessment: Kyphotic  Communication   Communication: Expressive difficulties;Receptive difficulties  Cognition Arousal/Alertness: Awake/alert Behavior During Therapy: Flat affect Overall Cognitive Status: Impaired/Different from baseline Area of Impairment: Attention, Memory, Following commands, Safety/judgement, Awareness, Problem solving                   Current Attention Level: Focused   Following Commands: Follows one step commands inconsistently (better with visual and tactile dues or demonstration but very inconsistent) Safety/Judgement: Decreased awareness of safety, Decreased awareness of deficits Awareness: Intellectual Problem Solving: Slow processing, Decreased initiation, Requires verbal cues, Requires tactile cues, Difficulty sequencing          General Comments General comments (skin integrity, edema, etc.): VSS on RA    Exercises     Assessment/Plan    PT Assessment Patient needs continued PT services  PT Problem List Decreased strength;Decreased activity tolerance;Decreased balance;Decreased mobility;Decreased cognition;Decreased knowledge of use of DME;Decreased safety awareness;Decreased knowledge of precautions       PT Treatment Interventions DME instruction;Functional mobility training;Therapeutic activities;Therapeutic exercise;Balance training;Neuromuscular re-education;Cognitive remediation;Patient/family education;Wheelchair mobility training    PT Goals (Current goals can be found in the Care Plan section)  Acute Rehab PT Goals Patient Stated Goal: family  goal to demonstrate improvement in command following and mobility PT Goal Formulation: With family Time For Goal Achievement: 07/25/21 Potential to Achieve Goals: Poor Additional Goals Additional Goal #1: Pt will demonstrate the ability to follow >33% of visual/tactile cues/commands    Frequency Min 3X/week     Co-evaluation               AM-PAC PT "6 Clicks" Mobility  Outcome Measure Help needed turning from your back to your side while in a flat bed without using bedrails?: A Lot Help needed moving from lying on your back to sitting on the side of a flat bed without using bedrails?: A Lot Help needed moving to and from a bed to a chair (including a wheelchair)?: Total Help needed standing up from a chair using your arms (e.g., wheelchair or bedside chair)?: Total Help needed to walk in hospital room?: Total Help needed climbing 3-5 steps with a railing? : Total 6 Click Score: 8    End of Session   Activity Tolerance: Patient tolerated treatment well Patient left: in bed;with call bell/phone within reach;with bed alarm set;with family/visitor present;with nursing/sitter in room Nurse Communication: Mobility status;Need for lift equipment PT Visit Diagnosis: Other abnormalities of gait and mobility (R26.89);Muscle weakness (generalized) (M62.81);Other symptoms and signs involving the nervous system (R29.898)    Time: 0272-5366 PT Time Calculation (min) (ACUTE ONLY): 29 min   Charges:   PT Evaluation $PT Eval High Complexity: 1 High  Zenaida Niece, PT, DPT Acute Rehabilitation Pager: [phone] Office [place] 07/11/2021, 5:26 PM

## 2021-07-11 NOTE — Evaluation (Signed)
Speech Language Pathology Evaluation Patient Details Name: Denise Page MRN: [id number] DOB: [birth year] Today's Date: 07/11/2021 Time: 1130-1140 SLP Time Calculation (min) (ACUTE ONLY): 10 min  Problem List:  Patient Active Problem List   Diagnosis Date Noted   Subdural hematoma 07/10/2021   Acute encephalopathy 11/20/2020   Other pancytopenia ([place]) 09/30/2020   Pulmonary HTN ([place]) 04/22/2020   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   Past Medical History:  Past Medical History:  Diagnosis Date   Anemia    Aortic stenosis    Bilateral carotid artery disease ([place])    Enlarged thyroid    Hyperlipidemia    Hypertension    NSTEMI (non-ST elevated myocardial infarction) [facility])    Past Surgical History:  Past Surgical History:  Procedure Laterality Date   BIOPSY  08/01/2020   Procedure: BIOPSY;  Surgeon: Ronnette Juniper, MD;  Location: [facility];  Service: Gastroenterology;;  EGD and COLON   CHOLECYSTECTOMY     COLONOSCOPY N/A 08/01/2020   Procedure: COLONOSCOPY;  Surgeon: Ronnette Juniper, MD;  Location: [facility] ENDOSCOPY;  Service: Gastroenterology;  Laterality: N/A;   CYSTECTOMY     ESOPHAGOGASTRODUODENOSCOPY (EGD) WITH PROPOFOL N/A 08/01/2020   Procedure: ESOPHAGOGASTRODUODENOSCOPY (EGD) WITH PROPOFOL;  Surgeon: Ronnette Juniper, MD;  Location: [facility] ENDOSCOPY;  Service: Gastroenterology;  Laterality: N/A;   POLYPECTOMY  08/01/2020   Procedure: POLYPECTOMY;  Surgeon: Ronnette Juniper, MD;  Location: [facility] ENDOSCOPY;  Service: Gastroenterology;;   TEE WITHOUT CARDIOVERSION N/A 11/13/2020   Procedure: TRANSESOPHAGEAL ECHOCARDIOGRAM (TEE);  Surgeon: Josue Hector, MD;  Location: [facility] ENDOSCOPY;  Service: Cardiovascular;  Laterality: N/A;   TONSILLECTOMY     HPI:  81 y.o. female admitted from hospice facility on 2/17 after demonstrating some recovery and improved MS. She has a complicated PMHx - admitted to [facility] 06/25/21 after fall, sustaining L SDH and right mandibular fx; she was discharged  home with 24/7 support.  Had another fall at home, readmitted to [facility], , underwent craniotomy for evacuation; demonstrated worsening status and repeat crani.  Ultimately she was placed in hospice care with a DNR/ DNI on 2/14, demonstrating subsequent improvements with admission to [facility].   Assessment / Plan / Recommendation Clinical Impression  Pt demonstrates global aphasia with difficulty following simple commands, impaired yes/no reliablity (head nods/shakes), and output limited to occasional one-word verbalizations.  She demonstrates improved spontanous facial expressions; she nods in response to questions about basic needs/comfort.  Recommend SLP f/u to address aphasia and family ed. Daughter, [name], feels it's most realistic to look at SNF for disposition.  Pt will benefit from OT and PT evaluations.      SLP Assessment  SLP Recommendation/Assessment: Patient needs continued Speech [facility] SLP Visit Diagnosis: Aphasia (R47.01)    Recommendations for follow up therapy are one component of a multi-disciplinary discharge planning process, led by the attending physician.  Recommendations may be updated based on patient status, additional functional criteria and insurance authorization.    Follow Up Recommendations  SLP at Long-term acute care hospital    Assistance Recommended at Discharge     Functional Status Assessment    Frequency and Duration min 2x/week  2 weeks      SLP Evaluation Cognition  Overall Cognitive Status: Impaired/Different from baseline Arousal/Alertness: Awake/alert (aphasia prevents thorough assessment)       Comprehension  Auditory Comprehension Overall Auditory Comprehension: Impaired Yes/No Questions: Impaired Basic Biographical Questions: 51-75% accurate Commands: Impaired One Step Basic Commands: 0-24% accurate Visual Recognition/Discrimination Discrimination: Exceptions to [facility] Common Objects: Unable  to indentify Reading  Comprehension Reading Status: Not tested    Expression Verbal Expression Overall Verbal Expression: Impaired Initiation: Impaired Automatic Speech:  (unable) Level of Generative/Spontaneous Verbalization: Word Repetition: Impaired Common Objects: Unable to indentify Written Expression Dominant Hand: Left   Oral / Motor  Oral Motor/Sensory Function Overall Oral Motor/Sensory Function:  (difficult to assess) Motor Speech Overall Motor Speech: Impaired            Assunta Curtis 07/11/2021, 12:52 PM Joyann Spidle L. Tivis Ringer, [place] Office number [phone] Pager [phone]

## 2021-07-11 NOTE — Consult Note (Signed)
Consultation Note Date: 07/11/2021   Patient Name: Denise Page  DOB: 1940-09-21  MRN: [id number]  Age / Sex: 81 y.o., female  PCP: Rosalee Kaufman, PA-C Referring Physician: Lucious Groves, DO  Reason for Consultation: Establishing goals of care  HPI/Patient Profile: 81 y.o. female  with past medical history of fall in yard at home (06/23/2021) with subsequent recurrent subdural hematoma s/p craniotomy [facility]). With minimal improvement at [facility], family transitioned patient to [facility] Inpatient unit. Once in hospice care and all medications not focused on comfort were stopped, family noticed patient was tracking with eyes and squeezing hands. Family decided to leave hospice and come to the hospital to pursue medical interventions. Pt was admitted on 07/10/2021 wherein CT of head revealed rebleeding of hematoma. Neuro was consulted and recommends AED washout for 1-2 days before evaluating for possible embolization.   As per notes, neuro has met with patient and her daughter/decision maker [name] to share that embolization is a possibility but will not provide immediate or improved mass effect.   Clinical Assessment and Goals of Care: I have reviewed medical records including EPIC notes, labs and imaging, assessed the patient and then met with patient and her daughter [name] at bedside to discuss diagnosis prognosis, GOC, EOL wishes, disposition and options.  I introduced Palliative Medicine as specialized medical care for people living with serious illness. It focuses on providing relief from the symptoms and stress of a serious illness. The goal is to improve quality of life for both the patient and the family.  We discussed a brief life review of the patient.  Patient did not work outside of the home.  She has 4 children, 1 from a previous marriage [name]) and 3 from her second marriage.  [name] is the  oldest of 4 children.  Patient had a boyfriend for 27 years who passed away.  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  With large layoffs approximately 5 years ago, patient was let go from [place], no longer worked and, as per [name], pt became depressed and sedentary, unwilling to drive anymore.  Patient currently lives with her son and his girlfriend.  As far as functional and nutritional status [name] shares her mother had significant functional decline since her AAA and heart attack approximately 2 years ago. [name] shares patient had a normal appetite and cognition was intact/oriented X3 prior to her fall in late January 2023.   Natural disease trajectory and expectations at EOL were discussed.  We discussed patient's current illness and what it means in the larger context of patient's on-going co-morbidities.  [name] understands patient's current health situation has an overall poor long-term prognosis.  However, [name] is hopeful to give her mother as much of a boost and maximize her quality of life as much as she can.  She does not want to give up or "starve" her mother. WE discussed that this may be her mother's "rally". [name] shared she likes to think of it that way and wants to make  sure she is doing everything she can for her mother's rally to last as long as possible.   Of note, [name] shares she felt heavy pressure from palliative at [facility] to discharge her mother to hospice.  She is resistant and hesitant to have anything to do with hospice now.  I again reiterated that palliative medicine is for patients living with chronic illnesses and that hospice is a separate philosophy, organization, and facility.  I attempted to elicit values and goals of care important to the patient.  [name] shares patient does not ever want to go to a nursing facility.  [name] shares that she and her family would like patient to go to [facility] since this is not "a nursing facility".  Discussed nutritional  needs and feeding tube.  [name] has a strong opinion that placing a feeding tube would not be in her mother's best interest and therefore does not want to move forward with a PEG.  She wants to ensure her mother can safely eat what ever she wants in hopes that she can keep up her nutritional status by taking food and drink in orally/naturally.    Discussed CODE STATUS.  [name] is clear that she does not want her mother to have chest compressions or ever be put on a ventilator.  However, [name] would like for medications to be given and shock to be provided should her mother's heart stop.  I discussed in detail that a partial code does not include respiratory support or providing circulation of blood to the heart and brain. I outlined that [name] only wants medications and shock to be given. [name] was clear that she would like for her mother to remain a partial code in order to avoid trauma but give her mother the "best chance available". Partial code remains.   The difference between aggressive medical intervention and comfort care was considered in light of the patient's goals of care.  [name] does not want to move towards a comfort plan of care.  She wants to continue to give her mother time to perk up and would like to investigate her options for a nursing facility.  [name] is disabled herself and is the sole child who is participating in her mother's care.  [name] cannot take care of her mother at home 24/7 and therefore hopes her mother can be placed in a facility.   Discussed with patient/family the importance of continued conversation with family and the medical providers regarding overall plan of care and treatment options, ensuring decisions are within the context of the patients values and GOCs.    Questions and concerns were addressed. The family was encouraged to call with questions or concerns.   Primary Decision Maker NEXT OF KIN  Code Status/Advance Care Planning: Limited code  Prognosis:    Unable to determine  Discharge Planning: To Be Determined  Primary Diagnoses: Present on Admission:  Subdural hematoma   Physical Exam Vitals and nursing note reviewed.  HENT:     Head:     Comments: Craniotomy staples in place on left scalp    Mouth/Throat:     Mouth: Mucous membranes are moist.  Cardiovascular:     Rate and Rhythm: Normal rate.     Pulses: Normal pulses.  Pulmonary:     Effort: Pulmonary effort is normal.  Abdominal:     Palpations: Abdomen is soft.  Musculoskeletal:     Comments: Generalized weakness  Skin:    General: Skin is warm and dry.  Neurological:  Comments: nonverbal    Palliative Assessment/Data: 20%     I discussed this patient's plan of care with patient, patient's daughter [name].  Thank you for this consult. Palliative medicine will continue to follow and assist holistically.   Time Total: 90 minutes Greater than 50%  of this time was spent counseling and coordinating care related to the above assessment and plan.  Signed by: Jordan Hawks, DNP, FNP-BC Palliative Medicine    Please contact Palliative Medicine Team phone at [phone] for questions and concerns.  For individual provider: See Shea Evans

## 2021-07-11 NOTE — Progress Notes (Signed)
Neurosurgery Service Progress Note  Subjective: No acute events overnight, currently getting her swallow study w/ SLP   Objective: Vitals:   07/11/21 0015 07/11/21 0030 07/11/21 0312 07/11/21 0800  BP: (!) 147/70 (!) 153/76 137/63 126/65  Pulse: 94 92 95 96  Resp: 19 18 16 18   Temp:   99.5 F (37.5 C) 99.3 F (37.4 C)  TempSrc:   Oral Oral  SpO2: 96% 95% 98% 97%    Physical Exam: Awake/alert, made one noise but otherwise nonverbal, no clear evidence of intact receptive speech, stable R sided weakness, moves L side  Assessment & Plan: 81 y.o. woman w/ recurrent SDH / recurrences / evacuations.  -no change in neurosurgical plan of care, again would give her time for the AEDs to clear, potentially an embo candidate but that would not lead to an immediate or dramatic improvement. We should give the AEDs another day or two to clear before deciding  Judith Part  07/11/21 11:44 AM

## 2021-07-11 NOTE — Progress Notes (Signed)
° °  Subjective: I seen and evaluated Denise Page at bedside.  She was present with her daughter. No changes overnight, per the daughter.  Daughter states that Denise Page spoke a few words and said her name.  Which is an improvement from the previous day.  Otherwise, no sign of decline or significant changes.  Objective:  Vital signs in last 24 hours: Vitals:   07/11/21 0015 07/11/21 0030 07/11/21 0312 07/11/21 0800  BP: (!) 147/70 (!) 153/76 137/63 126/65  Pulse: 94 92 95 96  Resp: 19 18 16 18   Temp:   99.5 F (37.5 C) 99.3 F (37.4 C)  TempSrc:   Oral Oral  SpO2: 96% 95% 98% 97%   Physical Exam Constitutional:      General: She is awake.  Cardiovascular:     Rate and Rhythm: Normal rate and regular rhythm.     Heart sounds: Murmur heard.  Systolic murmur is present.  Pulmonary:     Effort: Pulmonary effort is normal.     Breath sounds: Normal breath sounds and air entry.  Abdominal:     Palpations: Abdomen is soft.  Genitourinary:    Comments: Foley Present Skin:    General: Skin is warm and dry.  Neurological:     Comments: Nonverbal, head nodding, moving of left arm only     Assessment/Plan:  Principal Problem:   Subdural hematoma  #Subdural hematoma s/p craniotomy #Previous hospice care No events overnight per daughter.  No significant changes in mentation.  Nonverbal at baseline.  CT of the head obtained during admission revealed rebleeding of hematoma.  Neurosurgery evaluated patient and recommends patient being a possible candidate for endovascular MMA embolization which would stop further hemorrhage but would not reduce the mass effect.  Overall poor long-term prognosis.  It was noted that patient was previously on Keppra for seizure prophylaxis following head injury.  It is thought that as the AEDs metabolizes, there may be some neurologic improvement.  Palliative care was consulted and plan to evaluate patient. --Follow-up with palliative care  recommendation --Continue n.p.o. status due to high risk of aspiration and possible upcoming procedure --Continue oral care --D10 continuous infusion stopped; dietitian consulted --Wound care consulted for sacral wound  -- SLP consulted --Following palliative care recc; consult CSW for long term placement --Following palliative care recc; consult PT/OT for long term reccomendation   Prior to Admission Living Arrangement: Anticipated Discharge Location: Barriers to Discharge: Dispo: Anticipated discharge in approximately 1-2 day(s).   Timothy Lasso, MD 07/11/2021, 12:56 PM Pager: [phone] After 5pm on weekdays and 1pm on weekends: On Call pager [phone]

## 2021-07-11 NOTE — Evaluation (Addendum)
Clinical/Bedside Swallow Evaluation Patient Details  Name: Denise Page MRN: [id number] Date of Birth: 11-22-1940  Today's Date: 07/11/2021 Time: SLP Start Time (ACUTE ONLY): 4098 SLP Stop Time (ACUTE ONLY): 1130 SLP Time Calculation (min) (ACUTE ONLY): 10 min  Past Medical History:  Past Medical History:  Diagnosis Date   Anemia    Aortic stenosis    Bilateral carotid artery disease ([place])    Enlarged thyroid    Hyperlipidemia    Hypertension    NSTEMI (non-ST elevated myocardial infarction) [facility])    Past Surgical History:  Past Surgical History:  Procedure Laterality Date   BIOPSY  08/01/2020   Procedure: BIOPSY;  Surgeon: Ronnette Juniper, MD;  Location: [facility] ENDOSCOPY;  Service: Gastroenterology;;  EGD and COLON   CHOLECYSTECTOMY     COLONOSCOPY N/A 08/01/2020   Procedure: COLONOSCOPY;  Surgeon: Ronnette Juniper, MD;  Location: [facility] ENDOSCOPY;  Service: Gastroenterology;  Laterality: N/A;   CYSTECTOMY     ESOPHAGOGASTRODUODENOSCOPY (EGD) WITH PROPOFOL N/A 08/01/2020   Procedure: ESOPHAGOGASTRODUODENOSCOPY (EGD) WITH PROPOFOL;  Surgeon: Ronnette Juniper, MD;  Location: [facility] ENDOS[facility];  Service: Gastroenterology;  Laterality: N/A;   POLYPECTOMY  08/01/2020   Procedure: POLYPECTOMY;  Surgeon: Ronnette Juniper, MD;  [facility] ENDOS[facility];  Service: Gastroenterology;;   TEE WITHOUT CARDIOVERSION N/A 11/13/2020   Procedure: TRANSESOPHAGEAL ECHOCARDIOGRAM (TEE);  Surgeon: Josue Hector, MD;  Location: [facility] ENDOSCOPY;  Service: Cardiovascular;  Laterality: N/A;   TONSILLECTOMY     HPI:  81 y.o. female admitted from hospice facility on 2/17 after demonstrating some recovery and improved MS. She has a complicated PMHx - admitted to [facility] 06/25/21 after fall, sustaining L SDH and right mandibular fx; she was discharged home with 24/7 support.  Had another fall at home, readmitted to [facility], , underwent craniotomy for evacuation; demonstrated worsening status and repeat crani.  Ultimately she was placed in  hospice care with a DNR/ DNI on 2/14, demonstrating subsequent improvements with admission to [facility].    Assessment / Plan / Recommendation  Clinical Impression  Pt participated in clinical swallowing assessment. Her daughter had stepped out of the room prior to arrival.  RN helped to reposition Denise Page to maximize participation.  She was unable to follow oral-motor commands but was alert, occasionally vocalized and nodded head. She accepted ice chips without deficit.  There was anterior spillage of thin and honey thick liquids without awareness; swallow was subjectively delayed and there was a consistent cough response when consuming thin liquids.  Purees and honey-thick liquids did not elicit overt coughing.  There were sub-swallows needed with most boluses.  Overall, swallow appears to be weak, and there is some level of aspiration risk.  It would be best to proceed with MBS.  We could allow purees/dysphagia 1 and honey-thick liquids pending swallow study; will f/u with pt's family to determine their goals prior to making formal recommendations. D/W RN.   Addendum: Returned to pt's room to f/u with her daughter, Denise Page. We discussed improved swallowing, inherent risk of aspiration with dysphagia.  Denise Page fed her honey thick liquids and purees - pt tolerated well. Denise Page agrees to pursue MBS (will schedule for Monday, 2/20 given scheduling restrictions in radiology during the weekend).    SLP Visit Diagnosis: Dysphagia, oropharyngeal phase (R13.12)    Aspiration Risk    tba   Diet Recommendation   NPO pending discussion with family       Other  Recommendations Oral Care Recommendations: Oral care QID    Recommendations for follow  up therapy are one component of a multi-disciplinary discharge planning process, led by the attending physician.  Recommendations may be updated based on patient status, additional functional criteria and insurance authorization.  Follow up Recommendations  (tba)         Swallow Study   General HPI: 81 y.o. female admitted from hospice facility on 2/17 after demonstrating some recovery and improved MS. She has a complicated PMHx - admitted to [facility] 06/25/21 after fall, sustaining L SDH and right mandibular fx; she was discharged home with 24/7 support.  Had another fall at home, readmitted to [facility], , underwent craniotomy for evacuation; demonstrated worsening status and repeat crani.  Ultimately she was placed in hospice care with a DNR/ DNI on 2/14, demonstrating subsequent improvements with admission to [facility]. Type of Study: Bedside Swallow Evaluation Diet Prior to this Study: NPO Temperature Spikes Noted: Yes Respiratory Status: Room air History of Recent Intubation: No Behavior/Cognition: Alert Oral Cavity Assessment: Within Functional Limits Oral Care Completed by SLP: Recent completion by staff Self-Feeding Abilities: Total assist Patient Positioning: Upright in bed Baseline Vocal Quality: Low vocal intensity Volitional Cough: Cognitively unable to elicit Volitional Swallow: Unable to elicit    Oral/Motor/Sensory Function Overall Oral Motor/Sensory Function: Other (comment) (difficult to assess)   Ice Chips Ice chips: Within functional limits   Thin Liquid Thin Liquid: Impaired Presentation: Cup;Straw Oral Phase Functional Implications: Right anterior spillage;Left anterior spillage Pharyngeal  Phase Impairments: Suspected delayed Swallow;Multiple swallows;Cough - Immediate    Nectar Thick Nectar Thick Liquid: Not tested   Honey Thick Honey Thick Liquid: Impaired Presentation: Cup;Spoon;Straw Oral Phase Functional Implications: Right anterior spillage Pharyngeal Phase Impairments: Suspected delayed Swallow;Multiple swallows   Puree Puree: Impaired Presentation: Spoon Oral Phase Functional Implications: Prolonged oral transit Pharyngeal Phase Impairments: Suspected delayed Swallow   Solid     Solid: Not tested       Juan Quam Laurice 07/11/2021,12:13 PM  Estill Bamberg L. Tivis Ringer, [place] Office number [phone] Pager [phone]

## 2021-07-12 DIAGNOSIS — Z515 Encounter for palliative care: Secondary | ICD-10-CM | POA: Diagnosis not present

## 2021-07-12 MED ORDER — CAPSAICIN 0.025 % EX CREA
TOPICAL_CREAM | Freq: Two times a day (BID) | CUTANEOUS | Status: DC
  Filled 2021-07-12: qty 60

## 2021-07-12 NOTE — Consult Note (Signed)
[facility] Nurse Consult Note: Reason for Consult: Cluster of intact blisters on right buttock consistent with herpes zoster. Sacrum is intact. Wound type: viral  Pressure Injury POA: N/A Measurement: areas measures 5cm x 4cm  Wound bed:N/A Drainage (amount, consistency, odor) none Periwound: mild erythema Dressing procedure/placement/frequency: I will provide topical care using xeroform as a wound contact layer (astringent, antimicrobial, nonadherent) and top with a silicone foam dressing. A mattress replacement with low air loss feature and bilateral pressure redistribution heel boots are provided.  Recommended antiviral treatment if you agree, pleased order.  [facility] nursing team will not follow, but will remain available to this patient, the nursing and medical teams.  Please re-consult if needed. Thanks, Maudie Flakes, MSN, RN, [place], Arther Abbott  Pager# [phone]

## 2021-07-12 NOTE — Progress Notes (Addendum)
Palliative Medicine Inpatient Follow Up Note   HPI: 81 y.o. female  with past medical history of fall in yard at home (06/23/2021) with subsequent recurrent subdural hematoma s/p craniotomy [facility]). With minimal improvement at [facility], family transitioned patient to [facility] Inpatient unit. Once in hospice care and all medications not focused on comfort were stopped, family noticed patient was tracking with eyes and squeezing hands. Family decided to leave hospice and come to the hospital to pursue medical interventions. Pt was admitted on 07/10/2021 wherein CT of head revealed rebleeding of hematoma. Neuro was consulted and recommends AED washout for 1-2 days before evaluating for possible embolization.    As per notes, neuro has met with patient and her daughter/decision maker Denise Page to share that embolization is a possibility but will not provide immediate or improved mass effect.   Today's Discussion (07/12/2021):  *Please note that this is a verbal dictation therefore any spelling or grammatical errors are due to the "[place] One" system interpretation.  Chart reviewed inclusive of vital signs, progress notes, laboratory results, and diagnostic images.   I met with Denise Page and her daughter, Denise Page at bedside this morning. I introduced myself as Denise Page partner for the next few days. I shared that I would be present to continue conversations as they relate to Denise Page goals.  Denise Page shares with me that Denise Page has been diagnosed with shingles. This is new as of today, prior she was only thought to have had a bed sore. Reviewed that shingles can cause neuropathies and to keep an eye on Denise Page's comfort level moving forward.   I asked Denise Page if I could clarify Denise Page's wrist band - she shares that she would want shocks and medications given but no chest compressions or intubation.   I asked Denise Page if Denise Page had been out of bed and she shares that she has not been out  of bed she is not able to sit up nor can she hold her head up independently.  Created space and opportunity for Denise Page to explore thoughts feelings and fears regarding current medical situation. She shares that she "does not know what to ask". I expressed that I will be present moving forward and provided understanding of what she has been going through.  I expressed that I suspect Denise Page will not be accepted at [facility] due to her lack of respiratory needs. I shared that a common custodial nursing home will likely be the environment that she will go to which was understood.   Questions and concerns addressed   Palliative Support Provided.   Objective Assessment: Vital Signs Vitals:   07/12/21 0341 07/12/21 0744  BP: (!) 141/62 (!) 146/74  Pulse: 100 (!) 101  Resp: 16 19  Temp: 98.8 F (37.1 C) 98.8 F (37.1 C)  SpO2: 96% 96%    Intake/Output Summary (Last 24 hours) at 07/12/2021 1131 Last data filed at 07/12/2021 5053 Gross per 24 hour  Intake 100 ml  Output 750 ml  Net -650 ml   Gen: Elderly Caucasian female in no acute distress HEENT: moist mucous membranes CV: Irregular rate and rhythm  PULM: On room air breathing is even and unlabored ABD: soft/nontender EXT: No edema, (+) muscle wasting Neuro: Opens eyes does not follow commands  SUMMARY OF RECOMMENDATIONS   Partial code - no chest compressions, no intubation.   Family remains optimistic for ongoing improvements  Patient's daughter, Denise Page would like long-term placement I shared that given Denise Page's lack of respiratory needs I do not  think she will qualify for [facility] and will likely need to go to a custodial living facility  Ongoing incremental palliative care support  MDM - Moderate ______________________________________________________________________________________ [place] Team Team Cell Phone: [phone] Please utilize secure chat with additional questions, if  there is no response within 30 minutes please call the above phone number  Palliative Medicine Team providers are available by phone from 7am to 7pm daily and can be reached through the team cell phone.  Should this patient require assistance outside of these hours, please call the patient's attending physician.

## 2021-07-12 NOTE — Progress Notes (Signed)
HD#0 Subjective:  Overnight Events: No acute events overnight  Patient resting in bed comfortably.  Nursing staff note that she has been responding to some questions and moving her upper extremities more.  Upon presentation patient denies being in any discomfort or pain.  Objective:  Vital signs in last 24 hours: Vitals:   07/11/21 1509 07/11/21 1950 07/11/21 2336 07/12/21 0341  BP: (!) 142/62 (!) 132/92 (!) 141/88 (!) 141/62  Pulse: 98 (!) 109  100  Resp: 16 16 17 16   Temp: 99.7 F (37.6 C) 98.3 F (36.8 C) 98.2 F (36.8 C) 98.8 F (37.1 C)  TempSrc: Oral Oral Oral Oral  SpO2: 95% 97%  96%   Supplemental O2: Room Air SpO2: 96 %  Physical Exam:  Constitutional: Ill-appearing, no acute distress HENT: Mucous membranes moist.  Staples on superior and left lateral portions of head.  Clean dry and intact.  Ecchymosis inferior to left eye Eyes: Erythema of the left conjunctive a Neck: supple Cardiovascular: regular rate and rhythm, systolic murmur 3 out of 6. Pulmonary/Chest: normal work of breathing on room air, lungs clear to auscultation bilaterally Abdominal: soft, non-tender, non-distended MSK: normal bulk and tone Neurological: Alert. Oriented to self. Aphasic. Following some commands. Tracking, PERRL. LUE 4/5 strength. RUE 1/5 strength. LE 1/5 strength. Able to move all extremities. Swallowing intact.  Skin: warm and dry.  Vesicular rash on right buttock. Psych: Pleasant     There were no vitals filed for this visit.   Intake/Output Summary (Last 24 hours) at 07/12/2021 0719 Last data filed at 07/12/2021 0658 Gross per 24 hour  Intake 100 ml  Output 750 ml  Net -650 ml   Net IO Since Admission: -40.48 mL [07/12/21 0719]  Pertinent Labs: CBC Latest Ref Rng & Units 07/11/2021 07/10/2021 05/11/2021  WBC 4.0 - 10.5 K/uL 5.6 4.7 3.4(L)  Hemoglobin 12.0 - 15.0 g/dL 8.7(L) 9.0(L) 11.3(L)  Hematocrit 36.0 - 46.0 % 27.2(L) 28.0(L) 34.7(L)  Platelets 150 - 400 K/uL  268 250 138(L)    CMP Latest Ref Rng & Units 07/11/2021 07/10/2021 05/11/2021  Glucose 70 - 99 mg/dL 159(H) 82 110(H)  BUN 8 - 23 mg/dL 13 13 13   Creatinine 0.44 - 1.00 mg/dL 0.75 0.84 0.98  Sodium 135 - 145 mmol/L 136 139 139  Potassium 3.5 - 5.1 mmol/L 3.2(L) 3.8 4.0  Chloride 98 - 111 mmol/L 106 107 106  CO2 22 - 32 mmol/L 20(L) 18(L) 26  Calcium 8.9 - 10.3 mg/dL 8.4(L) 8.5(L) 9.1  Total Protein 6.5 - 8.1 g/dL - 6.4(L) 7.3  Total Bilirubin 0.3 - 1.2 mg/dL - 1.0 0.9  Alkaline Phos 38 - 126 U/L - 96 64  AST 15 - 41 U/L - 24 19  ALT 0 - 44 U/L - 21 12   Imaging: No results found.  Assessment/Plan:   Principal Problem:   Subdural hematoma  Patient Summary: Denise Page is a 81 y.o. with living with a history of subdural hematoma with significant midline shift and neurological deficits, orbital floor fracture, s/p TAVR, severe mitral regurgitation, Hx of NSTEMI, gastric ulcer, hypothyroidism who presented from hospice after improvement in neurological function and admitted for further evaluation.  She continues to improve neurologically and we will continue to be evaluated by neurosurgery for potential embolization.  We will continue to have discussions with palliative care and neurosurgery to assist with best plan moving forward.   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  This is more than likely secondary to the discontinuation of Keppra.  While she is having some improvement will defer to neurosurgery in terms of treatment and ultimately will need their expert opinion on her prognosis.  I do not believe embolization will affect the mass effect and midline shift that have occurred and I ultimately believe she will not do well long-term.  Daughter is hesitant to have further discussions with palliative care due to her experience with [facility] at [facility].  Appreciate their assistance in her  care while she has been admitted and they will continue to follow. -Appreciate neurosurgery and palliative care's assistance -Continue to monitor neurological status and plan for possible embolization per neurosurgery -Dysphagia diet per speech evaluation.  Pending nutrition consult -PT/OT recommending SNF -Continue air mattress to avoid sacral wounds  Herpes Zoster Please see image above.  Will discuss with daughter about treatment.  We will place her in airborne precaution room until lesions crust over.  Suspect she will need IV treatment we will discuss with pharmacy -We will discuss treatment with daughter  Aortic Stenosis s/p aortic valve replacement 11/22 Mitral Regurgitaion -Continue to monitor  Hypertension -Maps 80s to 90s.  We will continue to hold treatment at this time.  Her blood pressure stable.  CAD Hx of NSTEMI -Continue to hold antiplatelet therapy in setting of her hematomas.  Hypothyroidism  Patient's TSH elevated however until as patient is taking p.o. therapy we will hold off on treating.  She is not showing signs or symptoms of hypothyroidism at this time  Gastric Ulcer -Hold PPI therapy for now  Diet:  Dysphagia 1 IVF: None,None VTE: SCDs Code: DNI, no CPR.  Request of defibrillation and ACLS medications PT/OT recs: SNF for Subacute PT TOC recs: Pending Family Update: Daughter at bedside   Dispo: Anticipated discharge to outpatient care facility pending further work-up and possible neurosurgical intervention  Sanjuana Letters DO Internal Medicine Resident PGY-2 Pager [phone] Please contact the on call pager after 5 pm and on weekends at [phone].

## 2021-07-12 NOTE — Progress Notes (Signed)
Speech Language Pathology Treatment: Dysphagia  Patient Details Name: Denise Page MRN: [id number] DOB: 05/31/1940 Today's Date: 07/12/2021 Time: 0712-1975 SLP Time Calculation (min) (ACUTE ONLY): 20 min  Assessment / Plan / Recommendation Clinical Impression  New swallow eval consult received from Internal Medicine; reason unclear.  Pt was being fed lunch per her daughter, Denise Page. Observed with dysphagia 1, honey thick liquids. Ms Declercq is talking more today and able to engage in some social language. Smiling and interactive.  She demonstrated good oral attention and control; swallow was palpable; she drank honey thick liquids with good overall toleration. One incident of coughing noted after successive swallows from a straw.  Overall, doing well with current diet. Recommend continuing dysphagia 1/honey thick liquids. Will continue to plan for MBS next date - may need to be end of day given her new isolation precautions. D/W Denise Page and RN, both of whom agree with plan.   HPI HPI: 81 y.o. female admitted from hospice facility on 2/17 after demonstrating some recovery and improved MS. She has a complicated PMHx - admitted to [facility] 06/25/21 after fall, sustaining L SDH and right mandibular fx; she was discharged home with 24/7 support.  Had another fall at home, readmitted to [facility], , underwent craniotomy for evacuation; demonstrated worsening status and repeat crani.  Ultimately she was placed in hospice care with a DNR/ DNI on 2/14, demonstrating subsequent improvements with admission to [facility]. 2/19 dx'd with shingles and transferred to [facility].      SLP Plan  Continue with current plan of care MBS     Recommendations for follow up therapy are one component of a multi-disciplinary discharge planning process, led by the attending physician.  Recommendations may be updated based on patient status, additional functional criteria and insurance authorization.    Recommendations  Diet  recommendations: Dysphagia 1 (puree);Honey-thick liquid Liquids provided via: Cup;Straw Medication Administration: Whole meds with puree Supervision: Staff to assist with self feeding Compensations: Minimize environmental distractions                Oral Care Recommendations: Oral care BID Follow Up Recommendations: SLP at Long-term acute care hospital Assistance recommended at discharge: Frequent or constant Supervision/Assistance SLP Visit Diagnosis: Dysphagia, oropharyngeal phase (R13.12) Plan: Continue with current plan of care         Denise Page L. Tivis Ringer, [place] Office number [phone] Pager [phone]   Denise Page  07/12/2021, 3:46 PM

## 2021-07-12 NOTE — Progress Notes (Signed)
Neurosurgery Service Progress Note  Subjective: No acute events overnight, family at bedside, report continued improvement  Objective: Vitals:   07/11/21 1950 07/11/21 2336 07/12/21 0341 07/12/21 0744  BP: (!) 132/92 (!) 141/88 (!) 141/62 (!) 146/74  Pulse: (!) 109  100 (!) 101  Resp: 16 17 16 19   Temp: 98.3 F (36.8 C) 98.2 F (36.8 C) 98.8 F (37.1 C) 98.8 F (37.1 C)  TempSrc: Oral Oral Oral Oral  SpO2: 97%  96% 96%    Physical Exam: Awake/alert, speaking today and following commands, follows commands x4 with some right sided weakness but obviously improved  Assessment & Plan: 81 y.o. woman w/ recurrent SDH / recurrences / evacuations.  -continues to improve off keppra, will d/w our interventionalist to see if she's a candidate for MMA embo, discussed with family that she certainly might not be a candidate and that, even if she is, certainly no guarantee that it will help  Judith Part  07/12/21 1:02 PM

## 2021-07-13 ENCOUNTER — Observation Stay (HOSPITAL_COMMUNITY): Payer: Medicare Other

## 2021-07-13 DIAGNOSIS — L0233 Carbuncle of buttock: Secondary | ICD-10-CM | POA: Diagnosis present

## 2021-07-13 DIAGNOSIS — I10 Essential (primary) hypertension: Secondary | ICD-10-CM | POA: Diagnosis not present

## 2021-07-13 DIAGNOSIS — B029 Zoster without complications: Secondary | ICD-10-CM | POA: Diagnosis present

## 2021-07-13 DIAGNOSIS — G935 Compression of brain: Secondary | ICD-10-CM | POA: Diagnosis present

## 2021-07-13 DIAGNOSIS — Z79890 Hormone replacement therapy (postmenopausal): Secondary | ICD-10-CM | POA: Diagnosis not present

## 2021-07-13 DIAGNOSIS — F32A Depression, unspecified: Secondary | ICD-10-CM | POA: Diagnosis present

## 2021-07-13 DIAGNOSIS — I252 Old myocardial infarction: Secondary | ICD-10-CM | POA: Diagnosis not present

## 2021-07-13 DIAGNOSIS — I69391 Dysphagia following cerebral infarction: Secondary | ICD-10-CM | POA: Diagnosis not present

## 2021-07-13 DIAGNOSIS — D649 Anemia, unspecified: Secondary | ICD-10-CM | POA: Diagnosis present

## 2021-07-13 DIAGNOSIS — I5032 Chronic diastolic (congestive) heart failure: Secondary | ICD-10-CM | POA: Diagnosis present

## 2021-07-13 DIAGNOSIS — S065XAD Traumatic subdural hemorrhage with loss of consciousness status unknown, subsequent encounter: Secondary | ICD-10-CM | POA: Diagnosis not present

## 2021-07-13 DIAGNOSIS — R531 Weakness: Secondary | ICD-10-CM | POA: Diagnosis not present

## 2021-07-13 DIAGNOSIS — Z882 Allergy status to sulfonamides status: Secondary | ICD-10-CM | POA: Diagnosis not present

## 2021-07-13 DIAGNOSIS — Z66 Do not resuscitate: Secondary | ICD-10-CM | POA: Diagnosis present

## 2021-07-13 DIAGNOSIS — I69354 Hemiplegia and hemiparesis following cerebral infarction affecting left non-dominant side: Secondary | ICD-10-CM | POA: Diagnosis not present

## 2021-07-13 DIAGNOSIS — D62 Acute posthemorrhagic anemia: Secondary | ICD-10-CM | POA: Diagnosis not present

## 2021-07-13 DIAGNOSIS — Z6825 Body mass index (BMI) 25.0-25.9, adult: Secondary | ICD-10-CM | POA: Diagnosis not present

## 2021-07-13 DIAGNOSIS — Z952 Presence of prosthetic heart valve: Secondary | ICD-10-CM | POA: Diagnosis not present

## 2021-07-13 DIAGNOSIS — Z20822 Contact with and (suspected) exposure to covid-19: Secondary | ICD-10-CM | POA: Diagnosis present

## 2021-07-13 DIAGNOSIS — Y9301 Activity, walking, marching and hiking: Secondary | ICD-10-CM | POA: Diagnosis present

## 2021-07-13 DIAGNOSIS — F411 Generalized anxiety disorder: Secondary | ICD-10-CM | POA: Diagnosis present

## 2021-07-13 DIAGNOSIS — W1830XA Fall on same level, unspecified, initial encounter: Secondary | ICD-10-CM | POA: Diagnosis present

## 2021-07-13 DIAGNOSIS — E785 Hyperlipidemia, unspecified: Secondary | ICD-10-CM | POA: Diagnosis present

## 2021-07-13 DIAGNOSIS — Z515 Encounter for palliative care: Secondary | ICD-10-CM | POA: Diagnosis not present

## 2021-07-13 DIAGNOSIS — Z885 Allergy status to narcotic agent status: Secondary | ICD-10-CM | POA: Diagnosis not present

## 2021-07-13 DIAGNOSIS — E039 Hypothyroidism, unspecified: Secondary | ICD-10-CM | POA: Diagnosis present

## 2021-07-13 DIAGNOSIS — E876 Hypokalemia: Secondary | ICD-10-CM | POA: Diagnosis not present

## 2021-07-13 DIAGNOSIS — Y92009 Unspecified place in unspecified non-institutional (private) residence as the place of occurrence of the external cause: Secondary | ICD-10-CM | POA: Diagnosis not present

## 2021-07-13 DIAGNOSIS — Z79899 Other long term (current) drug therapy: Secondary | ICD-10-CM | POA: Diagnosis not present

## 2021-07-13 DIAGNOSIS — I110 Hypertensive heart disease with heart failure: Secondary | ICD-10-CM | POA: Diagnosis present

## 2021-07-13 DIAGNOSIS — K449 Diaphragmatic hernia without obstruction or gangrene: Secondary | ICD-10-CM | POA: Diagnosis present

## 2021-07-13 DIAGNOSIS — E43 Unspecified severe protein-calorie malnutrition: Secondary | ICD-10-CM | POA: Diagnosis present

## 2021-07-13 DIAGNOSIS — I2510 Atherosclerotic heart disease of native coronary artery without angina pectoris: Secondary | ICD-10-CM | POA: Diagnosis present

## 2021-07-13 DIAGNOSIS — S065XAA Traumatic subdural hemorrhage with loss of consciousness status unknown, initial encounter: Secondary | ICD-10-CM | POA: Diagnosis present

## 2021-07-13 MED ORDER — ENSURE ENLIVE PO LIQD
237.0000 mL | Freq: Three times a day (TID) | ORAL | Status: DC
  Administered 2021-07-13 – 2021-07-17 (×8): 237 mL via ORAL

## 2021-07-13 MED ORDER — ADULT MULTIVITAMIN W/MINERALS CH
1.0000 | ORAL_TABLET | Freq: Every day | ORAL | Status: DC
  Administered 2021-07-14 – 2021-07-17 (×3): 1 via ORAL
  Filled 2021-07-13 (×3): qty 1

## 2021-07-13 MED ORDER — POTASSIUM CHLORIDE 10 MEQ/100ML IV SOLN
10.0000 meq | INTRAVENOUS | Status: AC
  Administered 2021-07-13 (×2): 10 meq via INTRAVENOUS
  Filled 2021-07-13 (×2): qty 100

## 2021-07-13 MED ORDER — VALACYCLOVIR HCL 500 MG PO TABS
1000.0000 mg | ORAL_TABLET | Freq: Two times a day (BID) | ORAL | Status: DC
  Administered 2021-07-13 (×2): 1000 mg via ORAL
  Filled 2021-07-13 (×3): qty 2

## 2021-07-13 NOTE — Progress Notes (Signed)
°   07/13/21 1040  Clinical Encounter Type  Visited With Patient and family together  Visit Type Initial (Advanced Directive education)  Referral From Nurse  Consult/Referral To None   Chaplain responded to a spiritual consult request for education about Advanced Directive.  I shared with patient and family member. Additional family is expected later today and they plan to discuss the advanced directive at that time. Chaplain left directive and advised patient and family member that if they needed me to return to let the nurse know I would be happy to work with them.   [place] Resident  [facility]  [phone]

## 2021-07-13 NOTE — Progress Notes (Addendum)
Initial Nutrition Assessment  DOCUMENTATION CODES:   Severe malnutrition in context of acute illness/injury  INTERVENTION:   Ensure Enlive po TID, each supplement provides 350 kcal and 20 grams of protein.  MVI with minerals daily.  Obtain current weight for accurate nutrition assessment.  NUTRITION DIAGNOSIS:   Severe Malnutrition related to acute illness (SDH) as evidenced by moderate fat depletion, moderate muscle depletion, severe muscle depletion.  GOAL:   Patient will meet greater than or equal to 90% of their needs  MONITOR:   PO intake, Supplement acceptance, Labs, Weight trends, Skin  REASON FOR ASSESSMENT:   Consult Assessment of nutrition requirement/status  ASSESSMENT:   81 yo female admitted with SDH. CT head revealed re-bleeding of hematoma. PMH includes SDH s/p fall in yard at home 06/23/21, S/P craniotomy at [facility], aortic stenosis, HLD, HTN. NSTEMI, enlarged thyroid, anemia.  Family transitioned patient to Hosp[facility] care at [facility], then she showed signs of improvement, so family brought patient to hospital to pursue medical interventions. Neurosurgery is following and working on best options for care.   Patient is on airborne precautions d/t herpes zoster with vesicles present on R buttocks.   S/P MBS with SLP today. Diet has been advanced to dysphagia 3 with thin liquids.  Previously on dysphagia 1 diet with honey thick liquids 2/18-2/20 (meal intakes: 25-30%). Per RN, patient did not eat any lunch today. Hopeful that intake will improve with diet advancement.   Patient unable to provide much nutrition hx d/t aphasia. She was unable to state her usual weight, but says she is 5\' 7"  tall. Weight documented at 82.1 kg and height 5\' 8"  on 05/11/21. No current weight available.   Labs reviewed. K 3.2 Medications reviewed.  Weight history reviewed. Current weight is 7% above usual weight from August of 2023.   Patient meets criteria for moderate  malnutrition with mild-moderate depletion of muscle and subcutaneous fat mass.  NUTRITION - FOCUSED PHYSICAL EXAM:  Flowsheet Row Most Recent Value  Orbital Region Moderate depletion  Upper Arm Region Moderate depletion  Thoracic and Lumbar Region Mild depletion  Buccal Region Moderate depletion  Temple Region Mild depletion  Clavicle Bone Region Moderate depletion  Clavicle and Acromion Bone Region Moderate depletion  Scapular Bone Region Unable to assess  Dorsal Hand Severe depletion  Patellar Region Mild depletion  Anterior Thigh Region Mild depletion  Posterior Calf Region Mild depletion  Edema (RD Assessment) Mild  Hair Reviewed  Eyes Reviewed  Mouth Reviewed  Skin Reviewed  Nails Reviewed       Diet Order:   Diet Order             DIET DYS 3 Room service appropriate? Yes with Assist; Fluid consistency: Thin  Diet effective now                   EDUCATION NEEDS:   Not appropriate for education at this time  Skin:  Skin Assessment: Reviewed RN Assessment (blister to buttocks)  Last BM:  2/19 type 4  Height:   Ht Readings from Last 1 Encounters:  05/11/21 5\' 8"  (1.727 m)    Weight:   Wt Readings from Last 1 Encounters:  05/11/21 82.1 kg    BMI:  There is no height or weight on file to calculate BMI.  Estimated Nutritional Needs:   Kcal:  1800-2000  Protein:  85-100 gm  Fluid:  1.8-2 L    Lucas Mallow RD, LDN, CNSC Please refer to Amion for  contact information.

## 2021-07-13 NOTE — Progress Notes (Signed)
° °  Palliative Medicine Inpatient Follow Up Note  HPI: 81 y.o. female  with past medical history of fall in yard at home (06/23/2021) with subsequent recurrent subdural hematoma s/p craniotomy [facility]). With minimal improvement at [facility], family transitioned patient to [facility] Inpatient unit. Once in hospice care and all medications not focused on comfort were stopped, family noticed patient was tracking with eyes and squeezing hands. Family decided to leave hospice and come to the hospital to pursue medical interventions. Pt was admitted on 07/10/2021 wherein CT of head revealed rebleeding of hematoma. Neuro was consulted and recommends AED washout for 1-2 days before evaluating for possible embolization.    As per notes, neuro has met with patient and her daughter/decision maker Denise Page to share that embolization is a possibility but will not provide immediate or improved mass effect.   Today's Discussion (07/13/2021):  *Please note that this is a verbal dictation therefore any spelling or grammatical errors are due to the "[place] One" system interpretation.  Chart reviewed inclusive of vital signs, progress notes, laboratory results, and diagnostic images.   I met with Denise Page at bedside this morning. She is able to open her eyes and tell me her names. She shares with me that she would like some coffee and a newspaper. I shared that I would endorse this to staff.  I met with patients RN, [name] and nurse technician and endorsed that she would like coffee. I shared that she is a 1:1 feed and will need support during all activities.  No family present at bedside during my presence though I will call daughter to offer support today.   Objective Assessment: Vital Signs Vitals:   07/13/21 0320 07/13/21 0816  BP: 140/64 (!) 145/67  Pulse: 93   Resp: 20 20  Temp: 98.1 F (36.7 C) 97.9 F (36.6 C)  SpO2: 95%     Intake/Output Summary (Last 24 hours) at 07/13/2021 0930 Last data  filed at 07/13/2021 3419 Gross per 24 hour  Intake 440 ml  Output 400 ml  Net 40 ml   Gen: Elderly Caucasian female in no acute distress HEENT: moist mucous membranes CV: Irregular rate and rhythm  PULM: On room air breathing is even and unlabored ABD: soft/nontender EXT: No edema, (+) muscle wasting Neuro: Opens eyes does not follow commands  SUMMARY OF RECOMMENDATIONS   Partial code - no chest compressions, no intubation.   Family remains optimistic for ongoing improvements  Will need long term placement  Importance to verify nutrition is sufficient prior to discharge  Ongoing incremental palliative care support  MDM - Low ______________________________________________________________________________________ [place] Team Team Cell Phone: [phone] Please utilize secure chat with additional questions, if there is no response within 30 minutes please call the above phone number  Palliative Medicine Team providers are available by phone from 7am to 7pm daily and can be reached through the team cell phone.  Should this patient require assistance outside of these hours, please call the patient's attending physician.

## 2021-07-13 NOTE — Progress Notes (Signed)
° °  Subjective: I seen and evaluated Denise Page at bedside.  She appears more alert than previous days.  She is verbal, however words are intangible.  Her daughter Denise Page at bedside.  The daughter endorses continued improvement.  Objective:  Vital signs in last 24 hours: Vitals:   07/12/21 1934 07/12/21 2335 07/13/21 0320 07/13/21 0816  BP: 135/78 136/69 140/64 (!) 145/67  Pulse: 94 95 93   Resp: 20 20 20 20   Temp: 98.2 F (36.8 C) 98.1 F (36.7 C) 98.1 F (36.7 C) 97.9 F (36.6 C)  TempSrc: Oral Oral Oral Oral  SpO2: 98% 97% 95%    Physical Exam Constitutional:      General: She is awake. She is not in acute distress. Cardiovascular:     Rate and Rhythm: Normal rate and regular rhythm.  Pulmonary:     Breath sounds: Normal air entry.  Skin:    General: Skin is warm and dry.  Neurological:     Mental Status: She is alert.     Comments: Patient awake, and more alert than previous day Patient is verbal with intangible speech. Mobility of bilateral upper extremities. Lower extremities, no movement      Assessment/Plan:  Principal Problem:   Subdural hematoma  Denise Page is a 81 y.o. with living with a history of subdural hematoma with significant midline shift and neurological deficits, orbital floor fracture, s/p TAVR, severe mitral regurgitation, Hx of NSTEMI, gastric ulcer, hypothyroidism who presented from hospice after improvement in neurological function and admitted for further evaluation.   #Multiple subdural hematomas s/p craniotomy x2 #Previous hospice care Patient continues to have neurological improvement.  Today, patient is a verbal though speech intangible.  She is now able to mobilize both upper extremities.  She appears alert, and responds to her name by making eye contact, however unable to answer questions appropriately.  Per daughter, this is significant improvement since leaving hospice care several days ago.  Discussion with palliative care:  Patient is not a candidate for [facility] as she does not have respiratory needs.  Rather, patient will be better suited for a SNF, in which PT/OT is recommending; the daughter is agreeable.  Per neurosurgery, patient may be a candidate for embolization, though, will not improve overall poor prognosis. --Neurosurgery and palliative care assistance appreciated --Continue to monitor neurologic status --Continue dysphagia 1 diet --Continue air mattress to avoid pressure wounds  Herpes zoster Vesicles present along right buttocks in a dermatomal distribution.  Patient started on renally dosed oral valacyclovir for treatment.  Patient placed on airborne precaution until lesions crusted over.  Consider switching to IV if patient cannot tolerate medication orally. --Valacyclovir 1000 mg twice daily x7 days; end date 07/20/2021  All other chronic conditions, medications held.  As patient continues to improve, will readdress decision upon restarting previously given home medications.  In the meantime, will defer to neurosurgery regarding possible procedure.  Diet:  Dysphagia 1 IVF: None,None VTE: SCDs Code: DNI, no CPR.  Request of defibrillation and ACLS medications PT/OT recs: SNF for Subacute PT TOC recs: Pending Family Update: Daughter at bedside   Prior to Admission Living Arrangement: Anticipated Discharge Location: Barriers to Discharge: Dispo: Anticipated discharge in approximately 1-2 day(s).   Timothy Lasso, MD 07/13/2021, 12:22 PM Pager: [phone] After 5pm on weekdays and 1pm on weekends: On Call pager [phone]

## 2021-07-13 NOTE — Plan of Care (Signed)

## 2021-07-13 NOTE — Progress Notes (Signed)
Modified Barium Swallow Progress Note  Patient Details  Name: Denise Page MRN: [id number] Date of Birth: April 26, 1941  Today's Date: 07/13/2021  Modified Barium Swallow completed.  Full report located under Chart Review in the Imaging Section.  Brief recommendations include the following:  Clinical Impression  Pt presents with a very mild dysphagia marked by oral delays and mild, intermittent retention in right lateral sulcus and occasional anterior spillage. There was high penetration of thin liquids (PAS2) that was ejected from the larynx upon completion of the swallow, considered WFL. There was no aspiration.  Pharyngeal stripping wave was normal, leaving no residue post swallow. Recommend starting a dysphagia 3 diet with thin liquids; meds whole in liquid or puree, per preference.  D/W RN and pt's daughter at bedside after the study and swallow sign posted over [facility].   Swallow Evaluation Recommendations       SLP Diet Recommendations: Dysphagia 3 (Mech soft) solids;Thin liquid   Liquid Administration via: Straw   Medication Administration: Whole meds with liquid   Supervision: Patient able to self feed;Staff to assist with self feeding   Compensations: Minimize environmental distractions       Oral Care Recommendations: Oral care BID      Jesson Foskey L. Tivis Ringer, [place] Office number [phone] Pager [phone]   Juan Quam Laurice 07/13/2021,2:35 PM

## 2021-07-13 NOTE — TOC Initial Note (Signed)
Transition of Care [facility]) - Initial/Assessment Note    Patient Details  Name: Denise Page MRN: [id number] Date of Birth: Jul 03, 1940  Transition of Care [facility]) CM/SW Contact:    Coralee Pesa, [place] Phone Number: 07/13/2021, 3:44 PM  Clinical Narrative:                 CSW noted pt is disoriented at this time and spoke with pt's daughter, Denise Page, regarding discharge disposition. Denise Page notes she is agreeable to SNF and has a preference for [facility]. At this time family is hoping pt will be able to improve enough with short term SNF that pt will be able to go home with son. If pt does not improve enough, they will consider LTC. Pt has Medicaid. Pt has 2 covid vaccines, no boosters. Family does not want [facility]. CSW will fax out referrals and TOC will continue to follow for DC needs.   Expected Discharge Plan: Skilled Nursing Facility Barriers to Discharge: SNF Pending bed offer, Continued Medical Work up   Patient Goals and CMS Choice Patient states their goals for this hospitalization and ongoing recovery are:: Pt disoriented and unable to participate in goal setting. CMS Medicare.gov Compare Post Acute Care list provided to:: Patient Represenative (must comment) Denise Page, daughter) Choice offered to / list presented to : Adult Children  Expected Discharge Plan and Services Expected Discharge Plan: [place] Acute Care Choice: [place] Living arrangements for the past 2 months: [place], Single Family Home                                      Prior Living Arrangements/Services Living arrangements for the past 2 months: [place], [place] Lives with:: Facility Resident, Adult Children Patient language and need for interpreter reviewed:: Yes Do you feel safe going back to the place where you live?: Yes      Need for Family Participation in Patient Care: Yes (Comment) Care giver support system in  place?: Yes (comment)   Criminal Activity/Legal Involvement Pertinent to Current Situation/Hospitalization: No - Comment as needed  Activities of Daily Living   ADL Screening (condition at time of admission) Patient's cognitive ability adequate to safely complete daily activities?: No Is the patient deaf or have difficulty hearing?: No Does the patient have difficulty seeing, even when wearing glasses/contacts?: No Does the patient have difficulty concentrating, remembering, or making decisions?: Yes Patient able to express need for assistance with ADLs?: No Does the patient have difficulty dressing or bathing?: Yes Independently performs ADLs?: No Communication: Needs assistance Is this a change from baseline?: Pre-admission baseline Dressing (OT): Needs assistance Is this a change from baseline?: Pre-admission baseline Grooming: Needs assistance Is this a change from baseline?: Pre-admission baseline Feeding: Needs assistance Is this a change from baseline?: Pre-admission baseline Bathing: Needs assistance Is this a change from baseline?: Pre-admission baseline Toileting: Needs assistance Is this a change from baseline?: Pre-admission baseline In/Out Bed: Needs assistance Walks in Home: Needs assistance Does the patient have difficulty walking or climbing stairs?: Yes Weakness of Legs: Both Weakness of Arms/Hands: Both  Permission Sought/Granted Permission sought to share information with : Family Supports Permission granted to share information with : Yes, Verbal Permission Granted  Share Information with NAME: Denise Page     Permission granted to share info w Relationship: Daughter  Permission granted to share info  w Contact Information: [phone]  Emotional Assessment Appearance:: Appears stated age Attitude/Demeanor/Rapport: Unable to Assess Affect (typically observed): Unable to Assess Orientation: : Oriented to Self Alcohol / Substance Use: Not  Applicable Psych Involvement: No (comment)  Admission diagnosis:  Subdural hematoma [S06.5XAA] Weakness [R53.1] SDH (subdural hematoma) [S06.5XAA] Patient Active Problem List   Diagnosis Date Noted   Subdural hematoma 07/10/2021   Acute encephalopathy 11/20/2020   Other pancytopenia ([place]) 09/30/2020   Pulmonary HTN ([place]) 04/22/2020   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 [place] [address] Phone: [phone] Fax: [phone]     Social Determinants of Health (SDOH) Interventions    Readmission Risk Interventions No flowsheet data found.

## 2021-07-13 NOTE — Progress Notes (Signed)
IV attempt unsuccessful x2; spoke to RN regarding access needs. Due to no IV medications at this time, RN okay with holding on further attempts.

## 2021-07-13 NOTE — NC FL2 (Signed)
[place] LEVEL OF CARE SCREENING TOOL     IDENTIFICATION  Patient Name: Denise Page Birthdate: 06-May-1941 Sex: female Admission Date (Current Location): 07/10/2021  [facility] and [place] Number:  Herbalist and Address:  [facility], [place] [address] [place], [place] [address]      Provider Number: [id number]  Attending Physician Name and Address:  Lucious Groves, DO  Relative Name and Phone Number:  Bo Mcclintock, [phone]    Current Level of Care: Hospital Recommended Level of Care: [place] Prior Approval Number:    Date Approved/Denied:   PASRR Number: [id number]  Discharge Plan: SNF    Current Diagnoses: Patient Active Problem List   Diagnosis Date Noted   Subdural hematoma 07/10/2021   Acute encephalopathy 11/20/2020   Other pancytopenia ([place]) 09/30/2020   Pulmonary HTN ([place]) 04/22/2020   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    Orientation RESPIRATION BLADDER Height & Weight     Self  Normal Incontinent, Indwelling catheter Weight:   Height:     BEHAVIORAL SYMPTOMS/MOOD NEUROLOGICAL BOWEL NUTRITION STATUS      Incontinent Diet (See DC summary)  AMBULATORY STATUS COMMUNICATION OF NEEDS Skin   Extensive Assist Verbally Skin abrasions (R buttock Blister, Ecchymosis)                       Personal Care Assistance Level of Assistance  Bathing, Feeding, Dressing Bathing Assistance: Maximum assistance Feeding assistance: Maximum assistance Dressing Assistance: Maximum assistance     Functional Limitations Info  Sight, Hearing, Speech Sight Info: Adequate Hearing Info: Adequate Speech Info: Impaired    SPECIAL CARE FACTORS FREQUENCY  PT (By licensed PT), OT (By licensed OT)     PT Frequency: 5x week OT Frequency: 5x week            Contractures Contractures Info: Not present    Additional Factors Info  Code Status, Allergies Code Status Info:  Partial Allergies Info: Morphine And Related   Sulfa Antibiotics   Codeine   Tramadol           Current Medications (07/13/2021):  This is the current hospital active medication list Current Facility-Administered Medications  Medication Dose Route Frequency Provider Last Rate Last Admin   acetaminophen (TYLENOL) suppository 650 mg  650 mg Rectal Q4H PRN Rick Duff, MD   650 mg at 07/11/21 1811   acetaminophen (TYLENOL) tablet 650 mg  650 mg Oral Q6H PRN Rick Duff, MD   650 mg at 07/12/21 0818   capsaicin (ZOSTRIX) 0.025 % cream   Topical BID Riesa Pope, MD   Given at 07/12/21 2200   Chlorhexidine Gluconate Cloth 2 % PADS 6 each  6 each Topical Daily Lucious Groves, DO   6 each at 07/12/21 0818   feeding supplement (ENSURE ENLIVE / ENSURE PLUS) liquid 237 mL  237 mL Oral TID BM Lucious Groves, DO       MEDLINE mouth rinse  15 mL Mouth Rinse BID Joni Reining C, DO   15 mL at 07/12/21 0818   multivitamin with minerals tablet 1 tablet  1 tablet Oral Daily Lucious Groves, DO       valACYclovir (VALTREX) tablet 1,000 mg  1,000 mg Oral BID Timothy Lasso, MD         Discharge Medications: Please see discharge summary for a list of discharge medications.  Relevant Imaging Results:  Relevant Lab Results:   Additional Information  SS# [phone] [place]

## 2021-07-13 NOTE — Care Management Obs Status (Signed)
[place] NOTIFICATION   Patient Details  Name: Denise Page MRN: [id number] Date of Birth: Apr 16, 1941   Medicare Observation Status Notification Given:  Yes    Angelita Ingles, RN 07/13/2021, 9:27 AM

## 2021-07-14 DIAGNOSIS — E43 Unspecified severe protein-calorie malnutrition: Secondary | ICD-10-CM | POA: Insufficient documentation

## 2021-07-14 DIAGNOSIS — S065XAA Traumatic subdural hemorrhage with loss of consciousness status unknown, initial encounter: Secondary | ICD-10-CM | POA: Diagnosis not present

## 2021-07-14 MED ORDER — VALACYCLOVIR HCL 500 MG PO TABS
1000.0000 mg | ORAL_TABLET | Freq: Three times a day (TID) | ORAL | Status: DC
  Administered 2021-07-14 – 2021-07-17 (×10): 1000 mg via ORAL
  Filled 2021-07-14 (×12): qty 2

## 2021-07-14 NOTE — Plan of Care (Signed)
°  Problem: Clinical Measurements: Goal: Ability to maintain clinical measurements within normal limits will improve Outcome: Progressing Goal: Will remain free from infection Outcome: Progressing Goal: Diagnostic test results will improve Outcome: Progressing   Problem: Education: Goal: Knowledge of General Education information will improve Description: Including pain rating scale, medication(s)/side effects and non-pharmacologic comfort measures Outcome: Not Progressing   Problem: Health Behavior/Discharge Planning: Goal: Ability to manage health-related needs will improve Outcome: Not Progressing   Problem: Activity: Goal: Risk for activity intolerance will decrease Outcome: Not Progressing

## 2021-07-14 NOTE — Plan of Care (Signed)
°  Problem: Clinical Measurements: Goal: Respiratory complications will improve Outcome: Progressing Goal: Cardiovascular complication will be avoided Outcome: Progressing   Problem: Education: Goal: Knowledge of General Education information will improve Description: Including pain rating scale, medication(s)/side effects and non-pharmacologic comfort measures Outcome: Not Progressing   Problem: Health Behavior/Discharge Planning: Goal: Ability to manage health-related needs will improve Outcome: Not Progressing   Problem: Clinical Measurements: Goal: Ability to maintain clinical measurements within normal limits will improve Outcome: Not Progressing   Problem: Activity: Goal: Risk for activity intolerance will decrease Outcome: Not Progressing   Problem: Nutrition: Goal: Adequate nutrition will be maintained Outcome: Not Progressing   Problem: Coping: Goal: Level of anxiety will decrease Outcome: Not Progressing

## 2021-07-14 NOTE — TOC Progression Note (Addendum)
Transition of Care [facility]) - Progression Note    Patient Details  Name: Lelah Rennaker MRN: [id number] Date of Birth: [birth year]  Transition of Care [facility]) CM/SW [place], [place] Phone Number: 07/14/2021, 1:31 PM  Clinical Narrative:    4:00 CSW attempted to follow up w/ robin to discuss bed choices, VM was left. TOC will continue to follow for DC needs.  CSW was notified that several facilities in [place] have declined including [facility]. [place] [facility]) is the only offer in that are. CSW conveyed this information to pt's daughter to see if she had any other facilities she was interested in or if she wanted to try a different location, she stated she would speak with her brother and follow up with CSW. TOC will continue to follow for DC needs.   Expected Discharge Plan: [place] Barriers to Discharge: SNF Pending bed offer, Continued Medical Work up  Expected Discharge Plan and Services Expected Discharge Plan: [place] Choice: [place] arrangements for the past 2 months: [place], Single Family Home                                       Social Determinants of Health (SDOH) Interventions    Readmission Risk Interventions No flowsheet data found.

## 2021-07-14 NOTE — Progress Notes (Signed)
Physical Therapy Treatment Patient Details Name: Denise Page MRN: [id number] DOB: [birth year] Today's Date: 07/14/2021   History of Present Illness 81 y.o. female presents to [facility] hospital on 07/10/2021 from hospice after recent SDH with multiple evacuations and recurrences while admitted at outside hospital. Pt with neurological improvement at hospice once taken off keppra. CTH demonstrates mixed density L SDH with some hyperdense material, likely acute on chronic hemorrhage, stable MLS from prior [place] at OSH. PMH includes gastric ulcers, HLD, HTN, facial fxs, AVR.    PT Comments    Pt demonstrating progress towards her physical therapy goals. Able to follow simple, one step commands. Requiring minimal assist for bed mobility and moderate assist for transfers and limited ambulation. Pt able to wash face and brush teeth seated at sink. Returned to bed and placed in chair position to promote upright. Will continue to progress as tolerated.     Recommendations for follow up therapy are one component of a multi-disciplinary discharge planning process, led by the attending physician.  Recommendations may be updated based on patient status, additional functional criteria and insurance authorization.  Follow Up Recommendations  Skilled nursing-short term rehab (<3 hours/day)     Assistance Recommended at Discharge Frequent or constant Supervision/Assistance  Patient can return home with the following Two people to help with walking and/or transfers;Two people to help with bathing/dressing/bathroom;Assistance with cooking/housework;Assistance with feeding;Direct supervision/assist for medications management;Direct supervision/assist for financial management;Assist for transportation;Help with stairs or ramp for entrance   Equipment Recommendations  Other (comment) (defer)    Recommendations for Other Services       Precautions / Restrictions Precautions Precautions: Fall Restrictions Weight  Bearing Restrictions: No     Mobility  Bed Mobility Overal bed mobility: Needs Assistance Bed Mobility: Supine to Sit, Sit to Supine     Supine to sit: Min assist Sit to supine: Min guard   General bed mobility comments: Pt able to progress to left side of bed with no physical assist initially, minA for trunk facilitation to complete upright as pt attempting to initially lie back down. No physical assist for return to bed    Transfers Overall transfer level: Needs assistance Equipment used: Rolling walker (2 wheels) Transfers: Sit to/from Stand Sit to Stand: Mod assist, +2 safety/equipment           General transfer comment: modA to rise from edge of bed and standard height chair    Ambulation/Gait Ambulation/Gait assistance: Mod assist, +2 safety/equipment Gait Distance (Feet): 5 Feet Assistive device: Rolling walker (2 wheels) Gait Pattern/deviations: Step-through pattern, Decreased stride length, Narrow base of support Gait velocity: decreased     General Gait Details: Pt ambulating ~5 ft to from bed <> sink, demonstrates left lateral lean, requiring modA (+2 safety) for balance   Stairs             Wheelchair Mobility    Modified Rankin (Stroke Patients Only) Modified Rankin (Stroke Patients Only) Pre-Morbid Rankin Score: Slight disability Modified Rankin: Moderately severe disability     Balance Overall balance assessment: Needs assistance Sitting-balance support: Feet supported Sitting balance-Leahy Scale: Fair     Standing balance support: No upper extremity supported, During functional activity Standing balance-Leahy Scale: Poor Standing balance comment: left lateral lean, requiring modA for washing face                            Cognition Arousal/Alertness: Awake/alert Behavior During Therapy: Flat affect Overall Cognitive Status: Impaired/Different  from baseline Area of Impairment: Attention, Memory, Following commands,  Safety/judgement, Awareness, Problem solving                   Current Attention Level: Sustained   Following Commands: Follows one step commands inconsistently Safety/Judgement: Decreased awareness of safety, Decreased awareness of deficits Awareness: Intellectual Problem Solving: Slow processing, Decreased initiation, Requires verbal cues, Requires tactile cues, Difficulty sequencing General Comments: Benefits from one step, simple motor cues        Exercises      General Comments        Pertinent Vitals/Pain Pain Assessment Pain Assessment: Faces Faces Pain Scale: No hurt    Home Living                          Prior Function            PT Goals (current goals can now be found in the care plan section) Acute Rehab PT Goals Patient Stated Goal: family goal to demonstrate improvement in command following and mobility Potential to Achieve Goals: Fair Progress towards PT goals: Progressing toward goals    Frequency    Min 3X/week      PT Plan Current plan remains appropriate    Co-evaluation              AM-PAC PT "6 Clicks" Mobility   Outcome Measure  Help needed turning from your back to your side while in a flat bed without using bedrails?: A Little Help needed moving from lying on your back to sitting on the side of a flat bed without using bedrails?: A Little Help needed moving to and from a bed to a chair (including a wheelchair)?: A Lot Help needed standing up from a chair using your arms (e.g., wheelchair or bedside chair)?: A Lot Help needed to walk in hospital room?: Total Help needed climbing 3-5 steps with a railing? : Total 6 Click Score: 12    End of Session Equipment Utilized During Treatment: Gait belt Activity Tolerance: Patient tolerated treatment well Patient left: in bed;with call bell/phone within reach;with bed alarm set;with family/visitor present;with nursing/sitter in room Nurse Communication: Mobility  status PT Visit Diagnosis: Other abnormalities of gait and mobility (R26.89);Muscle weakness (generalized) (M62.81);Other symptoms and signs involving the nervous system (R29.898)     Time: 4403-4742 PT Time Calculation (min) (ACUTE ONLY): 20 min  Charges:  $Therapeutic Activity: 8-22 mins                     Wyona Almas, PT, DPT Acute Rehabilitation Services Pager [phone] Office [phone]    [place] Etienne 07/14/2021, 4:03 PM

## 2021-07-14 NOTE — Progress Notes (Signed)
° °  Subjective: I seen and evaluated Ms. Lipford at bedside. No significant changes in mentation since yesterday.  Daughter at bedside.  Objective:  Vital signs in last 24 hours: Vitals:   07/13/21 1600 07/13/21 2000 07/14/21 0323 07/14/21 0745  BP:  137/71 135/70 (!) 143/98  Pulse:  97    Resp:   18 18  Temp: 98.1 F (36.7 C) 98.1 F (36.7 C) 97.9 F (36.6 C) 98.8 F (37.1 C)  TempSrc: Oral Oral Oral Oral  SpO2: 95%      Physical Exam Constitutional:      General: She is awake.  Cardiovascular:     Rate and Rhythm: Normal rate and regular rhythm.  Pulmonary:     Breath sounds: Normal breath sounds and air entry.  Genitourinary:    Comments: Erupted vesicles noted on the right buttocks in a dermatomal distribution. Musculoskeletal:     Right lower leg: No edema.     Left lower leg: No edema.  Skin:    General: Skin is warm and dry.  Neurological:     Mental Status: She is alert.     Comments: Awake and alert.  Says "yes/no" and nods head, does not answer questions appropriately.       Assessment/Plan:  Principal Problem:   Subdural hematoma Active Problems:   Protein-calorie malnutrition, severe  Renesme Kerrigan is a 81 y.o. with living with a history of subdural hematoma with significant midline shift and neurological deficits, orbital floor fracture, s/p TAVR, severe mitral regurgitation, Hx of NSTEMI, gastric ulcer, hypothyroidism who presented from hospice after improvement in neurological function and admitted for further evaluation  #Multiple subdural hematomas s/p craniotomy x2 #Previous hospice care Patient is stable, with similar mental status as previous day. She is able to say "yes/no", nod her head, though most speech intangible. CSW is actively working on SNF placement. Patient diet has been advanced from Dysphagia 1 ---> Dysphagia 3. Pending update regarding embolization procedure. Patient pulled foley and IV line overnight. Best to limit lines as a much  as possible to help prevent hospital delirium. Will try external catheter.  --SNF pending --Place Purewick --Continue Dysphagia 3 diet --Tylenol 650mg  Q6H PRN for pain --Continue air mattress to avoid pressure wounds --Continue to monitor neurologic status --Neurology recommendations appreciated  Herpes Zoster Vesicles present on buttocks have erupted, noted on exam. The daughter endorsed patient has been uncomfortable, likely 2/2 the shingles. --Valacyclovir 1000mg  TID x7 days; End date: 07/20/21  All other chronic conditions, medications held.  As patient continues to improve, will readdress decision upon restarting previously given home medications.  In the meantime, will defer to neurosurgery regarding possible procedure.  Diet:  Dysphagia 3 IVF: None,None VTE: SCDs Code: Partial PT/OT recs: SNF for Subacute PT TOC recs: Pending Family Update: Daughter at bedside  Prior to Admission Living Arrangement: Anticipated Discharge Location: Barriers to Discharge: Dispo: Anticipated discharge in approximately 1-2 day(s).   Timothy Lasso, MD 07/14/2021, 10:10 AM Pager: [phone] After 5pm on weekdays and 1pm on weekends: On Call pager [phone]

## 2021-07-14 NOTE — Progress Notes (Signed)
°   07/14/21 1600  Clinical Encounter Type  Visited With Patient  Visit Type Follow-up  Consult/Referral To None   Chaplain stopped in to visit with patient. I visited briefly with a family member that was coming out of the room. I and sat quietly with the patient for a while. She was alert and acknowledged my presence with a smile. As I said goodbye the patient seemed to be sad.   [place] Resident [facility] [phone]

## 2021-07-14 NOTE — Progress Notes (Signed)
Pt has pulled out IV once and fer foley twice tonight. IV team was unsuccessful with IV insertion. PT currently does not have an IV and MD intern on call is aware. Pt refused foley insertion by stating "no" several times. MD intern on call is aware. Will attempt a foley insertion at a later time.

## 2021-07-15 DIAGNOSIS — S065XAA Traumatic subdural hemorrhage with loss of consciousness status unknown, initial encounter: Secondary | ICD-10-CM | POA: Diagnosis not present

## 2021-07-15 MED ORDER — PANTOPRAZOLE SODIUM 40 MG PO TBEC
40.0000 mg | DELAYED_RELEASE_TABLET | Freq: Every day | ORAL | Status: DC
Start: 2021-07-15 — End: 2021-07-17
  Administered 2021-07-15 – 2021-07-17 (×2): 40 mg via ORAL
  Filled 2021-07-15 (×2): qty 1

## 2021-07-15 NOTE — PMR Pre-admission (Signed)
PMR Admission Coordinator Pre-Admission Assessment  Patient: Denise Page is an 81 y.o., female MRN: [id number] DOB: 1940-08-17 Height: 5\' 8"  (172.7 cm) Weight: 74.6 kg  Insurance Information HMO:     PPO:      PCP:      IPA:      80/20: yes     OTHER:  PRIMARY: Medicare A and B      Policy#: [id number]      Subscriber: Pt Medicare       Policy#:       Subscriber: Pt. Phone#: Verified online    Fax#:  Pre-Cert#:       Employer:  Benefits:  Phone #:      Name:  Eff. Date: Parts A effective 11/21/2005 and B  06/25/2019 Deduct: $1600      Out of Pocket Max:  None      Life Max: N/A  CIR: 100%      SNF: 100 days Outpatient: 80%     Co-Pay: 20% Home Health: 100%      Co-Pay: none DME: 80%     Co-Pay: 20% Providers: patient's choice Providers: in network  SECONDARY: Medicaid of       Policy#: [id number] s     Phone#:   Development worker, community:       Phone#:   [employer] for patients in Inpatient Rehabilitation Facilities with attached Privacy Act [place] Records was provided and verbally reviewed with: Patient  Emergency Contact Information Contact Information     Name Relation Home Work [place] Daughter [phone]  [phone]   Lyanne Co   [phone]       Current Medical History  Patient Admitting Diagnosis: SDH, Debility History of Present Illness: Zondra Lawlor is a 81 y.o. female with a PMH of: 2 craniotomies for evacuation, gastric ulcers, HLD, HTN, facial fxs, AVR. Pt presented to [facility] ED 07/10/21 after family decided to remove her from hospice care due to an improvement in status. Patient presented to [facility] atrium on 02/02 after sustaining injuries from a fall.  She fell while walking outside her mailbox.  Her injuries were a left orbital floor fracture, subdural hematoma with a 5 mm shift, acute on chronic T8 fracture and a nondisplaced right mandibular subcondylar fracture.  Trauma as well as  neurosurgery were consulted.  ENT was consulted for her mandibular fracture and ophthalmology was consulted for an orbital fracture.  All of her fractures were managed nonoperatively. During this time she was started on Keppra for seizure prophylaxis. During this time she was started on Keppra for seizure prophylaxis  Ultimately she was placed in hospice care with a DNR/ DNI on 07/07/21.Marland Kitchen She was in hospice for 3-4 days, during which time Keppra was discontinued. Pt. Family noted improvement in mental status and brought her to [facility] for treatment, opting to rescind hospice care. In the ED, Patient was started on IV dextrose 10% continuous infusion 75 mL/h and CT head was obtained revealing recurrent hemorrhage.  Chest x-ray revealed cardiomegaly. Pt. Also found to have active shingles, placed on airborne precautions. PT/OT saw this pt. And recommended CIR to assist return to PLOF.    Patient's medical record from [facility] has been reviewed by the rehabilitation admission coordinator and physician.  Past Medical History  Past Medical History:  Diagnosis Date   Anemia    Aortic stenosis    Bilateral carotid artery disease (HCC)    Enlarged thyroid  Hyperlipidemia    Hypertension    NSTEMI (non-ST elevated myocardial infarction) [facility])     Has the patient had major surgery during 100 days prior to admission? Yes  Family History   family history includes Prostate cancer in her brother; Throat cancer in her sister.  Current Medications  Current Facility-Administered Medications:    acetaminophen (TYLENOL) suppository 650 mg, 650 mg, Rectal, Q4H PRN, Rick Duff, MD, 650 mg at 07/11/21 1811   acetaminophen (TYLENOL) tablet 650 mg, 650 mg, Oral, Q6H PRN, Rick Duff, MD, 650 mg at 07/16/21 0454   busPIRone (BUSPAR) tablet 5 mg, 5 mg, Oral, BID, Timothy Lasso, MD   capsaicin (ZOSTRIX) 0.025 % cream, , Topical, BID, Katsadouros, Vasilios, MD, Given at 07/16/21  2209   carvedilol (COREG) tablet 6.25 mg, 6.25 mg, Oral, BID WC, Timothy Lasso, MD   Chlorhexidine Gluconate Cloth 2 % PADS 6 each, 6 each, Topical, Daily, Lucious Groves, DO, 6 each at 07/16/21 1509   escitalopram (LEXAPRO) tablet 5 mg, 5 mg, Oral, QHS, Hoffman, Erik C, DO, 5 mg at 07/16/21 2210   feeding supplement (ENSURE ENLIVE / ENSURE PLUS) liquid 237 mL, 237 mL, Oral, TID BM, Hoffman, Erik C, DO, 237 mL at 07/17/21 1004   levothyroxine (SYNTHROID) tablet 25 mcg, 25 mcg, Oral, Q0600, Timothy Lasso, MD, 25 mcg at 07/17/21 0600   MEDLINE mouth rinse, 15 mL, Mouth Rinse, BID, Lucious Groves, DO, 15 mL at 07/15/21 2114   multivitamin with minerals tablet 1 tablet, 1 tablet, Oral, Daily, Lucious Groves, DO, 1 tablet at 07/15/21 0851   pantoprazole (PROTONIX) EC tablet 40 mg, 40 mg, Oral, Daily, Timothy Lasso, MD, 40 mg at 07/15/21 1620   valACYclovir (VALTREX) tablet 1,000 mg, 1,000 mg, Oral, TID, Timothy Lasso, MD, 1,000 mg at 07/16/21 2209  Patients Current Diet:  Diet Order             DIET DYS 3 Room service appropriate? Yes; Fluid consistency: Thin  Diet effective now                   Precautions / Restrictions Precautions Precautions: Fall, Other (comment) Precaution Comments: aphasic Restrictions Weight Bearing Restrictions: No   Has the patient had 2 or more falls or a fall with injury in the past year? Yes  Prior Activity Level Limited Community (1-2x/wk): Pt. went out for appointments only  Prior Functional Level Self Care: Did the patient need help bathing, dressing, using the toilet or eating? Independent  Indoor Mobility: Did the patient need assistance with walking from room to room (with or without device)? Independent  Stairs: Did the patient need assistance with internal or external stairs (with or without device)? Independent  Functional Cognition: Did the patient need help planning regular tasks such as shopping or remembering to take  medications? Independent  Patient Information Are you of Hispanic, Latino/a,or Spanish origin?: A. No, not of Hispanic, Latino/a, or Spanish origin What is your race?: A. White Do you need or want an interpreter to communicate with a doctor or health care staff?: 1. Yes  Patient's Response To:  Health Literacy and Transportation Is the patient able to respond to health literacy and transportation needs?: Yes Health Literacy - How often do you need to have someone help you when you read instructions, pamphlets, or other written material from your doctor or pharmacy?: Sometimes In the past 12 months, has lack of transportation kept you from medical appointments or from getting medications?: Yes In  the past 12 months, has lack of transportation kept you from meetings, work, or from getting things needed for daily living?: Yes  [place] Devices/Equipment: Other (Comment) (pt unable to answer)  Prior Device Use: Indicate devices/aids used by the patient prior to current illness, exacerbation or injury? None of the above  Current Functional Level Cognition  Arousal/Alertness: Awake/alert (aphasia prevents thorough assessment) Overall Cognitive Status: Impaired/Different from baseline Current Attention Level: Sustained Orientation Level: Oriented to person, Disoriented to situation, Disoriented to time, Disoriented to place Following Commands: Follows one step commands inconsistently Safety/Judgement: Decreased awareness of safety, Decreased awareness of deficits General Comments: Benefits from one step, simple motor cues. very pleasant, smiling, decreased verbalizations 2/2 aphasia. pt responds well to counting to help cue    Extremity Assessment (includes Sensation/Coordination)  Upper Extremity Assessment: Generalized weakness RUE Deficits / Details: pt demonstrates 3/5 grip strenght, 4-/5 elbow flexors and shoulder extensors when attempting to  pull to avoid posterior LOB. Difficult to formally assess 2/2 cognitive and communication deficits  Lower Extremity Assessment: RLE deficits/detail RLE Deficits / Details: RLE with flickers of movement from quads and dorsiflexors noted. Difficult to formally assess due to communication deficits.    ADLs  Overall ADL's : Needs assistance/impaired Grooming: Moderate assistance, Sitting Toilet Transfer: +2 for physical assistance, Minimal assistance, Stand-pivot, BSC/3in1 Toileting- Clothing Manipulation and Hygiene: Total assistance, Sit to/from stand Toileting - Clothing Manipulation Details (indicate cue type and reason): pt initiates the stand and total (A) for the peri care. pt with stedy assistance from second therapist Functional mobility during ADLs: +2 for physical assistance, Minimal assistance, Rolling walker (2 wheels) General ADL Comments: pt sits quickly without much cues when fatigued    Mobility  Overal bed mobility: Needs Assistance Bed Mobility: Supine to Sit, Sit to Supine Rolling: Max assist Supine to sit: Supervision Sit to supine: Min guard General bed mobility comments: Pt progressing to right side of bed with no physical assist. Cues for scooting forward to edge of bed    Transfers  Overall transfer level: Needs assistance Equipment used: Rolling walker (2 wheels) Transfers: Sit to/from Stand, Bed to chair/wheelchair/BSC Sit to Stand: +2 safety/equipment Bed to/from chair/wheelchair/BSC transfer type:: Stand pivot Stand pivot transfers: Min assist, +2 physical assistance General transfer comment: MinA + 2 to rise from bed and pivot to [facility], cues for reaching back to control eccentric descent    Ambulation / Gait / Stairs / Wheelchair Mobility  Ambulation/Gait Ambulation/Gait assistance: +2 safety/equipment, Min assist Gait Distance (Feet): 20 Feet (10 ft x 2) Assistive device: Rolling walker (2 wheels) Gait Pattern/deviations: Step-through pattern, Decreased  stride length, Narrow base of support, Scissoring General Gait Details: Pt ambulating 10 ft x 2 with minA for balance (+2 for chair follow), with one seated rest break. Pt with intermittent scissoring, narrow BOS, and mild R knee buckle. Gait velocity: decreased Gait velocity interpretation: <1.8 ft/sec, indicate of risk for recurrent falls    Posture / Balance Balance Overall balance assessment: Needs assistance Sitting-balance support: Feet supported Sitting balance-Leahy Scale: Fair Standing balance support: No upper extremity supported, During functional activity Standing balance-Leahy Scale: Poor Standing balance comment: reliant on BUE support of RW    Special needs/care consideration Skin Abrasion: arm/right, upper; Blister: buttocks/right; Ecchymosis: abdomen, hand, face/right, left; Rash: groin/bilateral, Bowel and Bladder incontinence; Airborne precautions   Previous Home Environment (from acute therapy documentation) Living Arrangements: Children Available Help at Discharge: Family Type of Home: [place] Name: [facility]  House ([facility]) Home Layout: One level Home Access: Stairs to enter Bathroom Shower/Tub: Chiropodist: Standard Bathroom Accessibility: Yes How Accessible: Accessible via walker [place]: No Additional Comments: daughter at bedside and reports the plan is for her to reutrn home to family. Stays with Son.  Discharge Living Setting Plans for Discharge Living Setting: Patient's home Type of Home at Discharge: House Discharge Home Layout: One level Discharge Home Access: Stairs to enter Entrance Stairs-Rails: Right Entrance Stairs-Number of Steps: 4 Discharge Bathroom Shower/Tub: Tub/shower unit Discharge Bathroom Toilet: Standard Discharge Bathroom Accessibility: Yes How Accessible: Accessible via walker Does the patient have any problems obtaining your medications?: No  Social/Family/Support  Systems Patient Roles: Spouse Contact Information: [phone] Anticipated Caregiver: Royston Bake (son) Anticipated Caregiver's Contact Information: [phone] Ability/Limitations of Caregiver: can provide min A Caregiver Availability: 24/7 Discharge Plan Discussed with Primary Caregiver: Yes Is Caregiver In Agreement with Plan?: Yes Does Caregiver/Family have Issues with Lodging/Transportation while Pt is in Rehab?: No  Goals Patient/Family Goal for Rehab: PT/OT/SLP Supervision Expected length of stay: 10-12 days Pt/Family Agrees to Admission and willing to participate: Yes Program Orientation Provided & Reviewed with Pt/Caregiver Including Roles  & Responsibilities: Yes  Decrease burden of Care through IP rehab admission: Diet advancement, Decrease number of caregivers, Bowel and bladder program, and Patient/family education  Possible need for SNF placement upon discharge: not anticipated   Patient Condition: I have reviewed medical records from [facility], spoken with CM, and patient and daughter. I discussed via phone for inpatient rehabilitation assessment.  Patient will benefit from ongoing PT, OT, and SLP, can actively participate in 3 hours of therapy a day 5 days of the week, and can make measurable gains during the admission.  Patient will also benefit from the coordinated team approach during an Inpatient Acute Rehabilitation admission.  The patient will receive intensive therapy as well as Rehabilitation physician, nursing, social worker, and care management interventions.  Due to bladder management, bowel management, safety, skin/wound care, disease management, medication administration, pain management, and patient education the patient requires 24 hour a day rehabilitation nursing.  The patient is currently Min A +2 with mobility and Min +2-Total A with basic ADLs.  Discharge setting and therapy post discharge at home with home health is anticipated.  Patient  has agreed to participate in the Acute Inpatient Rehabilitation Program and will admit today.  Preadmission Screen Completed By:  Genella Mech, with updates by Gayland Curry 07/17/2021 11:04 AM ______________________________________________________________________   Discussed status with Dr. Ranell Patrick on 07/17/21  at 11:04 AM and received approval for admission today.  Admission Coordinator:  Genella Mech, CCC-SLP, with updates by Gayland Curry, MS, CCC-SLP time 11:04 AM/Date 07/17/21    Assessment/Plan: Diagnosis: Subdural hematoma Does the need for close, 24 hr/day Medical supervision in concert with the patient's rehab needs make it unreasonable for this patient to be served in a less intensive setting? Yes Co-Morbidities requiring supervision/potential complications: overweight, severe protein calorie malnutrition, hypothyroidism, pulmonary HTN, nonrheumatic aortic valve stenosis Due to bladder management, bowel management, safety, skin/wound care, disease management, medication administration, pain management, and patient education, does the patient require 24 hr/day rehab nursing? Yes Does the patient require coordinated care of a physician, rehab nurse, PT, OT to address physical and functional deficits in the context of the above medical diagnosis(es)? Yes Addressing deficits in the following areas: balance, endurance, locomotion, strength, transferring, bowel/bladder control, bathing, dressing, feeding, grooming, toileting, and psychosocial support  Can the patient actively participate in an intensive therapy program of at least 3 hrs of therapy 5 days a week? Yes The potential for patient to make measurable gains while on inpatient rehab is excellent Anticipated functional outcomes upon discharge from inpatient rehab: supervision PT, supervision OT, supervision SLP Estimated rehab length of stay to reach the above functional goals is: 10-14 dyas Anticipated discharge  destination: Home 10. Overall Rehab/Functional Prognosis: excellent   MD Signature: Leeroy Cha, MD

## 2021-07-15 NOTE — Evaluation (Signed)
Occupational Therapy Evaluation Patient Details Name: Denise Page MRN: [id number] DOB: 09-02-1940 Today's Date: 07/15/2021   History of Present Illness 81 y.o. female presents to [facility] hospital on 07/10/2021 from hospice after recent SDH with multiple evacuations and recurrences while admitted at outside hospital. Pt with neurological improvement at hospice once taken off keppra. CTH demonstrates mixed density L SDH with some hyperdense material, likely acute on chronic hemorrhage, stable MLS from prior [place] at OSH. PMH includes gastric ulcers, HLD, HTN, facial fxs, AVR.   Clinical Impression   PT admitted with L SDH. Pt currently with functional limitiations due to the deficits listed below (see OT problem list). Pt progressing with basic transfer and adls this admission. Recommendation for CIR to help progress patient and decrease burden of care with family.  Pt will benefit from skilled OT to increase their independence and safety with adls and balance to allow discharge CIR.       Recommendations for follow up therapy are one component of a multi-disciplinary discharge planning process, led by the attending physician.  Recommendations may be updated based on patient status, additional functional criteria and insurance authorization.   Follow Up Recommendations  Acute inpatient rehab (3hours/day)    Assistance Recommended at Discharge Intermittent Supervision/Assistance  Patient can return home with the following A little help with walking and/or transfers;A little help with bathing/dressing/bathroom    Functional Status Assessment  Patient has had a recent decline in their functional status and demonstrates the ability to make significant improvements in function in a reasonable and predictable amount of time.  Equipment Recommendations       Recommendations for Other Services Rehab consult     Precautions / Restrictions Precautions Precautions: Fall;Other (comment) Precaution  Comments: aphasic Restrictions Weight Bearing Restrictions: No      Mobility Bed Mobility Overal bed mobility: Needs Assistance Bed Mobility: Supine to Sit, Sit to Supine     Supine to sit: Supervision     General bed mobility comments: Pt progressing to right side of bed with no physical assist. Cues for scooting forward to edge of bed    Transfers Overall transfer level: Needs assistance Equipment used: Rolling walker (2 wheels) Transfers: Sit to/from Stand, Bed to chair/wheelchair/BSC Sit to Stand: +2 safety/equipment Stand pivot transfers: Min assist, +2 physical assistance         General transfer comment: MinA + 2 to rise from bed and pivot to [facility], cues for reaching back to control eccentric descent      Balance Overall balance assessment: Needs assistance Sitting-balance support: Feet supported Sitting balance-Leahy Scale: Fair     Standing balance support: No upper extremity supported, During functional activity Standing balance-Leahy Scale: Poor Standing balance comment: reliant on BUE support of RW                           ADL either performed or assessed with clinical judgement   ADL Overall ADL's : Needs assistance/impaired     Grooming: Moderate assistance;Sitting                   Toilet Transfer: +2 for physical assistance;Minimal assistance;Stand-pivot;BSC/3in1   Toileting- Clothing Manipulation and Hygiene: Total assistance;Sit to/from stand Toileting - Clothing Manipulation Details (indicate cue type and reason): pt initiates the stand and total (A) for the peri care. pt with stedy assistance from second therapist     Functional mobility during ADLs: +2 for physical assistance;Minimal assistance;Rolling walker (2 wheels)  General ADL Comments: pt sits quickly without much cues when fatigued     Vision   Additional Comments: visually scanning and following therapist in session     Perception     Praxis       Pertinent Vitals/Pain Pain Assessment Pain Assessment: No/denies pain     Hand Dominance Left   Extremity/Trunk Assessment Upper Extremity Assessment Upper Extremity Assessment: Generalized weakness       Cervical / Trunk Assessment Cervical / Trunk Assessment: Kyphotic   Communication Communication Communication: Expressive difficulties;Receptive difficulties   Cognition Arousal/Alertness: Awake/alert Behavior During Therapy: Flat affect Overall Cognitive Status: Impaired/Different from baseline Area of Impairment: Attention, Memory, Following commands, Safety/judgement, Awareness, Problem solving                   Current Attention Level: Sustained   Following Commands: Follows one step commands inconsistently Safety/Judgement: Decreased awareness of safety, Decreased awareness of deficits Awareness: Intellectual Problem Solving: Slow processing, Decreased initiation, Requires verbal cues, Requires tactile cues, Difficulty sequencing General Comments: Benefits from one step, simple motor cues. very pleasant, smiling, decreased verbalizations 2/2 aphasia. pt responds well to counting to help cue     General Comments  VSS RA    Exercises     Shoulder Instructions      Home Living Family/patient expects to be discharged to:: Private residence Living Arrangements: Children Available Help at Discharge: Family Type of Home: House Home Access: Stairs to enter     Home Layout: One level     Bathroom Shower/Tub: Teacher, early years/pre: Standard         Additional Comments: daughter at bedside and reports the plan is for her to reutrn home to family. Stays with Son.      Prior Functioning/Environment Prior Level of Function : Needs assist               ADLs Comments: reports that patient was able to stand pivot to bathroom surfaces prior to admission        OT Problem List: Decreased activity tolerance;Impaired balance (sitting  and/or standing);Decreased knowledge of precautions;Decreased knowledge of use of DME or AE;Decreased safety awareness;Decreased cognition;Decreased strength      OT Treatment/Interventions: Self-care/ADL training;Therapeutic exercise;Energy conservation;DME and/or AE instruction;Manual therapy;Therapeutic activities;Cognitive remediation/compensation;Patient/family education;Balance training    OT Goals(Current goals can be found in the care plan section) Acute Rehab OT Goals Patient Stated Goal: family wants to get patient to one person (A) level to complete basic transfer in the home. pt could benefit from CIR to reach min to min guard (A) with transfers OT Goal Formulation: With patient/family Time For Goal Achievement: 07/29/21 Potential to Achieve Goals: Good  OT Frequency: Min 2X/week    Co-evaluation PT/OT/SLP Co-Evaluation/Treatment: Yes Reason for Co-Treatment: Complexity of the patient's impairments (multi-system involvement);For patient/therapist safety;To address functional/ADL transfers PT goals addressed during session: Mobility/safety with mobility;Balance OT goals addressed during session: ADL's and self-care;Proper use of Adaptive equipment and DME;Strengthening/ROM      AM-PAC OT "6 Clicks" Daily Activity     Outcome Measure Help from another person eating meals?: A Little Help from another person taking care of personal grooming?: A Little Help from another person toileting, which includes using toliet, bedpan, or urinal?: A Lot Help from another person bathing (including washing, rinsing, drying)?: A Lot Help from another person to put on and taking off regular upper body clothing?: A Little Help from another person to put on and taking off regular lower  body clothing?: Total 6 Click Score: 14   End of Session Equipment Utilized During Treatment: Rolling walker (2 wheels);Gait belt Nurse Communication: Mobility status;Precautions  Activity Tolerance: Patient  tolerated treatment well Patient left: in chair;with call bell/phone within reach;with family/visitor present (chair pad placed no alarm box in room RN notified)  OT Visit Diagnosis: Unsteadiness on feet (R26.81);Muscle weakness (generalized) (M62.81)                Time: 0712-1975 OT Time Calculation (min): 28 min Charges:  OT General Charges $OT Visit: 1 Visit OT Evaluation $OT Eval Moderate Complexity: 1 Mod   Brynn, OTR/L  Acute Rehabilitation Services Pager: [phone] Office: [phone] .   Jeri Modena 07/15/2021, 11:23 AM

## 2021-07-15 NOTE — Progress Notes (Signed)
Physical Therapy Treatment Patient Details Name: Denise Page MRN: [id number] DOB: Oct 09, 1940 Today's Date: 07/15/2021   History of Present Illness 81 y.o. female presents to [facility] hospital on 07/10/2021 from hospice after recent SDH with multiple evacuations and recurrences while admitted at outside hospital. Pt with neurological improvement at hospice once taken off keppra. 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 PMH includes gastric ulcers, HLD, HTN, facial fxs, AVR.    PT Comments    Pt making excellent progress towards her physical therapy goals. She is pleasant and motivated to participate in therapy session. Pt requiring two person minimal assist for functional mobility. Ambulating 10 ft x 2 with a walker and close chair follow. Demonstrates impaired communication, cognition, standing balance, right sided weakness, and decreased activity tolerance. In light of progression, family support, and motivation, updated d/c plan to [facility].   Recommendations for follow up therapy are one component of a multi-disciplinary discharge planning process, led by the attending physician.  Recommendations may be updated based on patient status, additional functional criteria and insurance authorization.  Follow Up Recommendations  Acute inpatient rehab (3hours/day)     Assistance Recommended at Discharge Frequent or constant Supervision/Assistance  Patient can return home with the following Assistance with cooking/housework;Direct supervision/assist for medications management;Direct supervision/assist for financial management;Assist for transportation;Help with stairs or ramp for entrance;A little help with walking and/or transfers;A little help with bathing/dressing/bathroom   Equipment Recommendations  Rolling walker (2 wheels);BSC/3in1;Wheelchair (measurements PT);Wheelchair cushion (measurements PT)     Recommendations for Other Services       Precautions / Restrictions Precautions Precautions: Fall;Other (comment) Precaution Comments: aphasic Restrictions Weight Bearing Restrictions: No     Mobility  Bed Mobility Overal bed mobility: Needs Assistance Bed Mobility: Supine to Sit, Sit to Supine     Supine to sit: Supervision     General bed mobility comments: Pt progressing to right side of bed with no physical assist. Cues for scooting forward to edge of bed    Transfers Overall transfer level: Needs assistance Equipment used: Rolling walker (2 wheels) Transfers: Sit to/from Stand, Bed to chair/wheelchair/BSC Sit to Stand: +2 safety/equipment Stand pivot transfers: Min assist, +2 physical assistance         General transfer comment: MinA + 2 to rise from bed and pivot to [facility], cues for reaching back to control eccentric descent    Ambulation/Gait Ambulation/Gait assistance: +2 safety/equipment, Min assist Gait Distance (Feet): 20 Feet (10 ft x 2) Assistive device: Rolling walker (2 wheels) Gait Pattern/deviations: Step-through pattern, Decreased stride length, Narrow base of support, Scissoring Gait velocity: decreased Gait velocity interpretation: <1.8 ft/sec, indicate of risk for recurrent falls   General Gait Details: Pt ambulating 10 ft x 2 with minA for balance (+2 for chair follow), with one seated rest break. Pt with intermittent scissoring, narrow BOS, and mild R knee buckle.   Stairs             Wheelchair Mobility    Modified Rankin (Stroke Patients Only) Modified Rankin (Stroke Patients Only) Pre-Morbid Rankin Score: Slight disability Modified Rankin: Moderately severe disability     Balance Overall balance assessment: Needs assistance Sitting-balance support: Feet supported Sitting balance-Leahy Scale: Fair     Standing balance support: No upper extremity supported, During functional activity Standing balance-Leahy Scale:  Poor Standing balance comment: reliant on BUE support of RW  Cognition Arousal/Alertness: Awake/alert Behavior During Therapy: Flat affect Overall Cognitive Status: Impaired/Different from baseline Area of Impairment: Attention, Memory, Following commands, Safety/judgement, Awareness, Problem solving                   Current Attention Level: Sustained   Following Commands: Follows one step commands inconsistently Safety/Judgement: Decreased awareness of safety, Decreased awareness of deficits Awareness: Intellectual Problem Solving: Slow processing, Decreased initiation, Requires verbal cues, Requires tactile cues, Difficulty sequencing General Comments: Benefits from one step, simple motor cues. very pleasant, smiling, decreased verbalizations 2/2 aphasia        Exercises      General Comments        Pertinent Vitals/Pain Pain Assessment Pain Assessment: No/denies pain    Home Living                          Prior Function            PT Goals (current goals can now be found in the care plan section) Acute Rehab PT Goals Patient Stated Goal: family goal to demonstrate improvement in ambulation and sitting up Potential to Achieve Goals: Fair Progress towards PT goals: Progressing toward goals    Frequency    Min 3X/week      PT Plan Discharge plan needs to be updated    Co-evaluation PT/OT/SLP Co-Evaluation/Treatment: Yes Reason for Co-Treatment: For patient/therapist safety;To address functional/ADL transfers PT goals addressed during session: Mobility/safety with mobility;Balance        AM-PAC PT "6 Clicks" Mobility   Outcome Measure  Help needed turning from your back to your side while in a flat bed without using bedrails?: A Little Help needed moving from lying on your back to sitting on the side of a flat bed without using bedrails?: A Little Help needed moving to and from a bed to a chair  (including a wheelchair)?: A Lot Help needed standing up from a chair using your arms (e.g., wheelchair or bedside chair)?: A Lot Help needed to walk in hospital room?: A Lot Help needed climbing 3-5 steps with a railing? : Total 6 Click Score: 13    End of Session Equipment Utilized During Treatment: Gait belt Activity Tolerance: Patient tolerated treatment well Patient left: with call bell/phone within reach;with family/visitor present;in chair;with chair alarm set Nurse Communication: Mobility status PT Visit Diagnosis: Other abnormalities of gait and mobility (R26.89);Muscle weakness (generalized) (M62.81);Other symptoms and signs involving the nervous system (R29.898)     Time: 7096-2836 PT Time Calculation (min) (ACUTE ONLY): 33 min  Charges:  $Therapeutic Activity: 8-22 mins                     Wyona Almas, PT, DPT Acute Rehabilitation Services Pager [phone] Office [phone]    Deno Etienne 07/15/2021, 10:59 AM

## 2021-07-15 NOTE — Progress Notes (Signed)
Speech Language Pathology Treatment: Cognitive-Linquistic  Patient Details Name: Denise Page MRN: [id number] DOB: 12/15/1940 Today's Date: 07/15/2021 Time: 9935-7017 SLP Time Calculation (min) (ACUTE ONLY): 30 min  Assessment / Plan / Recommendation Clinical Impression  Denise Page was seen at bedside for cognitive-linguistic therapy. Her daughter was present and an active participant throughout the session. Both parties denied baseline deficits in cognition and language. Treatment session focused on expanding verbal output. Patient accurately named 10/12 objects in pictures; accuracy improved to 12/12 when provided with a phonemic cue and model. Yes/no questions incorporated into the session and pt demonstrated inconsistent responses; this is consistent with the last targeted session (07/11/21). Patient spontaneously used phrases, however she benefited from having choices provided to her to facilitate expressing wants/needs. SLP integrated melodic intonation therapy into the session to provide rhythmic support for the patient to produce functional phrases (I.e., name, "I'm ok", counting). SLP educated daughter about how to facilitate choices across activities, modeling target phrases (I.e., "I want to take a nap"), and using melodic intonation to encourage joint-attention in increasing verbal output. Daughter verbalized agreement with all strategies demonstrated by the SLP. SLP to follow acutely.  HPI HPI: 81 y.o. female admitted from hospice facility on 2/17 after demonstrating some recovery and improved MS. She has a complicated PMHx - admitted to [facility] 06/25/21 after fall, sustaining L SDH and right mandibular fx; she was discharged home with 24/7 support.  Had another fall at home, readmitted to [facility], , underwent craniotomy for evacuation; demonstrated worsening status and repeat crani.  Ultimately she was placed in hospice care with a DNR/ DNI on 2/14, demonstrating subsequent improvements with  admission to [facility]. 2/19 dx'd with shingles and transferred to [facility].      SLP Plan  Continue with current plan of care      Recommendations for follow up therapy are one component of a multi-disciplinary discharge planning process, led by the attending physician.  Recommendations may be updated based on patient status, additional functional criteria and insurance authorization.    Recommendations                   Oral Care Recommendations: Oral care BID Follow Up Recommendations: Skilled nursing-short term rehab (<3 hours/day) Assistance recommended at discharge: Frequent or constant Supervision/Assistance SLP Visit Diagnosis: Aphasia (R47.01) Plan: Continue with current plan of care           [facility]  07/15/2021, 12:41 PM

## 2021-07-15 NOTE — Plan of Care (Signed)
°  Problem: Health Behavior/Discharge Planning: Goal: Ability to manage health-related needs will improve Outcome: Progressing   Problem: Clinical Measurements: Goal: Ability to maintain clinical measurements within normal limits will improve Outcome: Progressing Goal: Will remain free from infection Outcome: Progressing   Problem: Elimination: Goal: Will not experience complications related to urinary retention Outcome: Progressing   Problem: Nutrition: Goal: Adequate nutrition will be maintained Outcome: Not Progressing

## 2021-07-15 NOTE — Progress Notes (Signed)
° °  Subjective: I seen and evaluated Denise Page at bedside.  She was working with physical therapy.  She nodded her head yes when I asked to do a physical exam.  Speech still intangible.  Daughter present in the room.  Objective:  Vital signs in last 24 hours: Vitals:   07/14/21 2021 07/15/21 0156 07/15/21 0207 07/15/21 0227  BP: (!) 148/70 136/88  136/88  Pulse:  (!) 110  (!) 110  Resp: 18 20  20   Temp: 97.8 F (36.6 C) 98 F (36.7 C)  98 F (36.7 C)  TempSrc: Oral Oral  Oral  SpO2:  95%    Weight:   75.5 kg 75.5 kg  Height:    5\' 8"  (1.727 m)   Physical Exam Constitutional:      General: She is awake. She is not in acute distress. HENT:     Head: Normocephalic and atraumatic.  Cardiovascular:     Heart sounds: Normal heart sounds.  Pulmonary:     Effort: Pulmonary effort is normal.     Breath sounds: Normal air entry.  Musculoskeletal:     Right lower leg: No edema.     Left lower leg: No edema.  Skin:    General: Skin is warm and dry.  Neurological:     Mental Status: She is alert.     Comments: Able to follow simple commands Speech intangible  Psychiatric:        Behavior: Behavior is cooperative.     Assessment/Plan:  Principal Problem:   Subdural hematoma Active Problems:   Protein-calorie malnutrition, severe  Denise Page is a 81 y.o. with living with a history of subdural hematoma with significant midline shift and neurological deficits, orbital floor fracture, s/p TAVR, severe mitral regurgitation, Hx of NSTEMI, gastric ulcer, hypothyroidism who presented from hospice after improvement in neurological function and admitted for further evaluation.  #Multiple subdural hematomas s/p craniotomy x2 #Previous hospice care Patient continues to work with PT/OT and is showing improvement.  From a neurologic standpoint, patient still nods her head, though most speech intangible.  PT/OT is now recommending CIR.  Patient may be a candidate.  Neurosurgery is  planning for diagnostic angiogram tomorrow with possible embolization.  Patient's daughter, states that the patient complaining of some heartburn.  Will add Protonix to daily regimen.  Otherwise, no significant changes overnight. --N.p.o. at midnight --Protonix 40 mg daily --Continue PT/OT --Dysphagia 3 diet --Limit lines as much as possible; external catheter placed yesterday  Herpes zoster Stable; slowly resolving. --Valacyclovir 1000 mg 3 times daily x7 days; end date: 07/20/2021  Diet:  Dysphagia 3 IVF: None,None VTE: SCDs Code: Partial PT/OT recs: Acute inpatient rehab TOC recs: Pending Family Update: Daughter at bedside  Prior to Admission Living Arrangement: Anticipated Discharge Location: Barriers to Discharge: Dispo: Anticipated discharge in approximately 1-2 day(s).   Denise Lasso, MD 07/15/2021, 1:18 PM Pager: [phone] After 5pm on weekdays and 1pm on weekends: On Call pager [phone]

## 2021-07-15 NOTE — Progress Notes (Signed)
I have reviewed the patient's chart and spoken with my partner, Dr. Zada Finders. Pt may be a candidate for middle meningeal artery embolization as an attempt to allow resorption of the cSDH without further enlargement. Can plan on diagnostic angiogram tomorrow with possible embolization. Will follow-up with patient and family later today.  Consuella Lose, MD [facility]

## 2021-07-15 NOTE — Plan of Care (Signed)
°  Problem: Education: Goal: Knowledge of General Education information will improve Description: Including pain rating scale, medication(s)/side effects and non-pharmacologic comfort measures Outcome: Not Progressing   Problem: Health Behavior/Discharge Planning: Goal: Ability to manage health-related needs will improve Outcome: Not Progressing   Problem: Clinical Measurements: Goal: Ability to maintain clinical measurements within normal limits will improve Outcome: Progressing Goal: Will remain free from infection Outcome: Progressing Goal: Diagnostic test results will improve Outcome: Progressing Goal: Respiratory complications will improve Outcome: Progressing Goal: Cardiovascular complication will be avoided Outcome: Progressing   Problem: Activity: Goal: Risk for activity intolerance will decrease Outcome: Not Progressing   Problem: Nutrition: Goal: Adequate nutrition will be maintained Outcome: Not Progressing   Problem: Coping: Goal: Level of anxiety will decrease Outcome: Not Progressing   Problem: Elimination: Goal: Will not experience complications related to bowel motility Outcome: Progressing Goal: Will not experience complications related to urinary retention Outcome: Progressing   Problem: Pain Managment: Goal: General experience of comfort will improve Outcome: Progressing   Problem: Safety: Goal: Ability to remain free from injury will improve Outcome: Progressing   Problem: Skin Integrity: Goal: Risk for impaired skin integrity will decrease Outcome: Progressing

## 2021-07-15 NOTE — Progress Notes (Signed)
Inpatient Rehab Admissions Coordinator:  ? ?Per therapy recommendations,  patient was screened for CIR candidacy by Raynetta Osterloh, MS, CCC-SLP. At this time, Pt. Appears to be a a potential candidate for CIR. I will place   order for rehab consult per protocol for full assessment. Please contact me any with questions. ? ?Caster Fayette, MS, CCC-SLP ?Rehab Admissions Coordinator  ?[phone] (celll) ?[phone] (office) ? ?

## 2021-07-16 ENCOUNTER — Other Ambulatory Visit (HOSPITAL_COMMUNITY): Payer: Medicare Other

## 2021-07-16 ENCOUNTER — Encounter (HOSPITAL_COMMUNITY): Payer: Self-pay | Admitting: Anesthesiology

## 2021-07-16 ENCOUNTER — Encounter (HOSPITAL_COMMUNITY)
Admission: EM | Disposition: A | Discharge status: Skilled Nursing Facility | Payer: Self-pay | Source: Home / Self Care | Attending: Internal Medicine

## 2021-07-16 DIAGNOSIS — B029 Zoster without complications: Secondary | ICD-10-CM

## 2021-07-16 DIAGNOSIS — S065XAA Traumatic subdural hemorrhage with loss of consciousness status unknown, initial encounter: Secondary | ICD-10-CM | POA: Diagnosis not present

## 2021-07-16 DIAGNOSIS — D649 Anemia, unspecified: Secondary | ICD-10-CM | POA: Diagnosis not present

## 2021-07-16 DIAGNOSIS — E039 Hypothyroidism, unspecified: Secondary | ICD-10-CM | POA: Diagnosis not present

## 2021-07-16 LAB — CBC WITH DIFFERENTIAL/PLATELET
Abs Immature Granulocytes: 0.27 10*3/uL — ABNORMAL HIGH (ref 0.00–0.07)
Basophils Absolute: 0 10*3/uL (ref 0.0–0.1)
Basophils Relative: 0 %
Eosinophils Absolute: 0 10*3/uL (ref 0.0–0.5)
Eosinophils Relative: 1 %
HCT: 26.5 % — ABNORMAL LOW (ref 36.0–46.0)
Hemoglobin: 8.6 g/dL — ABNORMAL LOW (ref 12.0–15.0)
Immature Granulocytes: 4 %
Lymphocytes Relative: 21 %
Lymphs Abs: 1.3 10*3/uL (ref 0.7–4.0)
MCH: 28.1 pg (ref 26.0–34.0)
MCHC: 32.5 g/dL (ref 30.0–36.0)
MCV: 86.6 fL (ref 80.0–100.0)
Monocytes Absolute: 0.9 10*3/uL (ref 0.1–1.0)
Monocytes Relative: 14 %
Neutro Abs: 3.7 10*3/uL (ref 1.7–7.7)
Neutrophils Relative %: 60 %
Platelets: 212 10*3/uL (ref 150–400)
RBC: 3.06 MIL/uL — ABNORMAL LOW (ref 3.87–5.11)
RDW: 14.7 % (ref 11.5–15.5)
WBC: 6.2 10*3/uL (ref 4.0–10.5)
nRBC: 0 % (ref 0.0–0.2)

## 2021-07-16 LAB — BASIC METABOLIC PANEL
Anion gap: 11 (ref 5–15)
BUN: 12 mg/dL (ref 8–23)
CO2: 22 mmol/L (ref 22–32)
Calcium: 9.1 mg/dL (ref 8.9–10.3)
Chloride: 106 mmol/L (ref 98–111)
Creatinine, Ser: 0.9 mg/dL (ref 0.44–1.00)
GFR, Estimated: >60 mL/min (ref >60)
Glucose, Bld: 130 mg/dL — ABNORMAL HIGH (ref 70–99)
Potassium: 3.9 mmol/L (ref 3.5–5.1)
Sodium: 139 mmol/L (ref 135–145)

## 2021-07-16 SURGERY — IR WITH ANESTHESIA
Anesthesia: General

## 2021-07-16 MED ORDER — ESCITALOPRAM OXALATE 10 MG PO TABS
5.0000 mg | ORAL_TABLET | Freq: Every day | ORAL | Status: DC
  Administered 2021-07-16: 5 mg via ORAL
  Filled 2021-07-16: qty 1

## 2021-07-16 MED ORDER — LEVOTHYROXINE SODIUM 25 MCG PO TABS
25.0000 ug | ORAL_TABLET | Freq: Every day | ORAL | Status: DC
  Administered 2021-07-17: 25 ug via ORAL
  Filled 2021-07-16: qty 1

## 2021-07-16 NOTE — Progress Notes (Signed)
°  NEUROSURGERY PROGRESS NOTE   Pt somewhat agitate overnight. This am she appears to be resting calmly in bed. Does not report any HA.  EXAM:  BP 127/65    Pulse 99    Temp 98.7 F (37.1 C) (Oral)    Resp 19    Ht 5\' 8"  (1.727 m)    Wt 75.1 kg    SpO2 95%    BMI 25.17 kg/m   Awake, alert, oriented to person, not year or place. 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  She has presented to the hospital from a hospice facility after they noted significant improvement in her mental status.  At this point, I was asked to see the patient for possible endovascular left middle meningeal artery embolization.  While I do believe this procedure is an option in an attempt to prevent enlargement of the subdural and allow her to resorb this spontaneously, it is certainly not without risk.  There is a small risk of stroke, arterial dissection, and groin hematoma, as well as small risk of contrast nephropathy.  More importantly, given her current condition I think there is a significant risk of inability to be extubated postoperatively.  In fact, patient's daughter tells me today that they were told at [facility] that if she underwent another general anesthesia she would not be able to come off the ventilator.  After a lengthy discussion with the patient's daughter regarding the above, she indicates to me that she does not believe the patient would want to undergo another procedure under general anesthesia if there is a risk that she would not be able to be extubated.  PLAN: -Would continue with supportive care -As the patient's daughter does not believe she would ever want to undergo another procedure requiring general anesthesia, I do not see the need for continued radiographic follow-up of the  subdural.   Consuella Lose, MD [facility]

## 2021-07-16 NOTE — Plan of Care (Signed)

## 2021-07-16 NOTE — Progress Notes (Signed)
°   07/16/21 1155  Clinical Encounter Type  Visited With Patient and family together  Visit Type Follow-up  Referral From Nurse  Consult/Referral To None   Chaplain followed up with patient and family member. Patient was smiling and conversing with [place] today. She was in good spirits and calm. Family member also shared a bit of family review and what brought the patient to the hospital.  We had an enjoyable conversation.   [place] Resident  [facility]  [phone]

## 2021-07-16 NOTE — Progress Notes (Signed)
Nutrition Follow-up  DOCUMENTATION CODES:   Severe malnutrition in context of acute illness/injury  INTERVENTION:   - Continue Ensure Enlive po TID, each supplement provides 350 kcal and 20 grams of protein  - Continue MVI with minerals daily  - Encourage PO intake and provide feeding assistance as needed  NUTRITION DIAGNOSIS:   Severe Malnutrition related to acute illness (SDH) as evidenced by moderate fat depletion, moderate muscle depletion, severe muscle depletion.  Ongoing, being addressed via oral nutrition supplements  GOAL:   Patient will meet greater than or equal to 90% of their needs  Progressing  MONITOR:   PO intake, Supplement acceptance, Labs, Weight trends, Skin  REASON FOR ASSESSMENT:   Consult Assessment of nutrition requirement/status  ASSESSMENT:   81 yo female admitted with SDH. CT head revealed re-bleeding of hematoma. PMH includes SDH s/p fall in yard at home 06/23/21, S/P craniotomy at [facility], aortic stenosis, HLD, HTN. NSTEMI, enlarged thyroid, anemia.  Plan today was for possible endovascular left middle meningeal artery embolization. However, pt's daughter indicates that the pt would not want to undergo another procedure under general anesthesia if there is a risk that she would not be able to be extubated.  Reviewed weight history in chart. Pt with a 7 kg weight loss since 05/11/21. This is an 8.5% weight loss in 2 months which is significant for timeframe. Pt already with diagnosis of moderate malnutrition.  Pt with fair PO intake. Ensure Enlive ordered TID and is accepting the supplement when offered most of the time. Will continue with current supplement and MVI regimen.  First measured weight: 75.5 kg Current weight: 75.1 kg  Meal Completion: 25-60%  Medications reviewed and include: Ensure Enlive TID, MVI with minerals daily, protonix, protonix  Labs reviewed: hemoglobin 8.6  I/O's: -2.3 L since admit  Diet Order:   Diet  Order             DIET DYS 3 Room service appropriate? Yes; Fluid consistency: Thin  Diet effective now                   EDUCATION NEEDS:   Not appropriate for education at this time  Skin:  Skin Assessment: Reviewed RN Assessment (blister to buttocks)  Last BM:  07/15/21 type 6  Height:   Ht Readings from Last 1 Encounters:  07/15/21 5\' 8"  (1.727 m)    Weight:   Wt Readings from Last 1 Encounters:  07/16/21 75.1 kg    BMI:  Body mass index is 25.17 kg/m.  Estimated Nutritional Needs:   Kcal:  1800-2000  Protein:  85-100 gm  Fluid:  1.8-2 L    Gustavus Bryant, MS, RD, LDN Inpatient Clinical Dietitian Please see AMiON for contact information.

## 2021-07-16 NOTE — Progress Notes (Signed)
IP rehab admissions - awaiting ID input to see when airborne precautions can be discontinued.  Will follow progress for now.  Call for questions.  [phone]

## 2021-07-16 NOTE — Progress Notes (Signed)
Overnight: Patient complained of abdominal pain and felt like "she was dying". Patient was noted to be extremely anxious and tearful. She pointed towards her suprapubic area for location of pain. On exam, abd was soft and non-distended. No rebound/guarding. Mild suprapubic tenderness to deep palpation. PureWick intact, no issues with urination. Patient was reassured by night team.   Subjective: I seen and evaluated Denise Page at bedside. She was lying comfortably in bed. Daughter present at bedside. She has blue eyeliner on, she used skin marker and gave herself eyeliner. She appeared happy about to be doing things on her own. No harm to her eyes. Denies any abdominal pain at this time.  Objective:  Vital signs in last 24 hours: Vitals:   07/15/21 2014 07/16/21 0314 07/16/21 0426 07/16/21 0749  BP:   (!) 146/77 (!) 147/65  Pulse:   (!) 104 (!) 101  Resp: 16  18 19   Temp: 98.2 F (36.8 C)  98.3 F (36.8 C) 98.7 F (37.1 C)  TempSrc: Oral  Oral Oral  SpO2:   97% 97%  Weight:  75.1 kg    Height:       Physical Exam Constitutional:      General: She is awake. She is not in acute distress. Cardiovascular:     Rate and Rhythm: Normal rate.  Pulmonary:     Effort: Pulmonary effort is normal.     Breath sounds: Normal air entry.  Abdominal:     Palpations: Abdomen is soft.     Tenderness: There is no abdominal tenderness.  Skin:    General: Skin is warm and dry.  Neurological:     Mental Status: She is alert.     Comments: Nods head; continues to talk though unintelligible speech      Assessment/Plan:  Principal Problem:   Subdural hematoma Active Problems:   Protein-calorie malnutrition, severe  Denise Page is a 80 y.o. with living with a history of subdural hematoma with significant midline shift and neurological deficits, orbital floor fracture, s/p TAVR, severe mitral regurgitation, Hx of NSTEMI, gastric ulcer, hypothyroidism who presented from hospice after  improvement in neurological function and admitted for further evaluation.  #Multiple subdural hematomas s/p craniotomy x2 #Previous hospice care Overnight, patient became anxious, it appears as her neurologic status continues to evolve, she becomes more anxious. Patient's daughter states that in the past, patient was being treated for anxiety with Ativan. Given patient's age and underlying intracranial abnormalities, ativan would not be a good option for treatment of anxiety. In lieu of upcoming embolization scheduled today, follow up with neurological status post-op to determine treatment course, such as restarting various home meds vs new rx regimen. PT/OT recommending CIR. Now that it has been decided that procedure will be more harm than benefit due to possibility of permeant ventilation following sedation. It is appropriate now to began restarting some home medications. --PureWick in place --Disphagia 3 diet   Hypothyroidism Patient has a hx of hypothyroidism on synthroid 25 mcg daily. All of her home medications were discontinued when transferred to hospice. Since leaving hospice and being hospitalized here. There discussion regarding MMA embolization for re-bleed found on imaging. Medications were held in the setting of acute neurologic changes and goals of care discussion. Now that patient will move forward without procedure. Resume synthroid. --Started on home medication Synthroid 25 mgc daily  Normocytic Anemia At baseline, patient has hx of normocytic anemia, per patient's daughter, patient had a remote hx of iron def anemia  in which she was supplemented and resolved. She also experienced a life threatening anemia, hgb of >5 resulting in demand ischemic MI years ago. Since then, pt has been chronically anemic. Hgb on admission was 9, currently at 8.7 (few days ago). Pt does have active bleeding, a slow intracranial re-bleed found on admission (hx of hematoma), no surgical intervention at this  time decided by family and neurosurgery (as stated above). --Trend CBC  Herpes zoster Will reach out to infectious prevention to determine protocol for airborne/contact precautions. --Valacyclovir 1000 mg 3 times daily x7 days; end date: 07/20/2021    Prior to Admission Living Arrangement: Anticipated Discharge Location: Barriers to Discharge: Dispo: Anticipated discharge in approximately 1-2 day(s).   Timothy Lasso, MD 07/16/2021, 9:32 AM Pager: [phone] After 5pm on weekdays and 1pm on weekends: On Call pager [phone]

## 2021-07-16 NOTE — Progress Notes (Signed)
Pt calls out requesting the nurse. Pt was tearful and anxious. Although the pt is hard to understand, she was able to make out a few words. She kept repeating something is wrong and I am going to die. She was also pointing at the incision on her head while saying this. I reassured the pt and told her the incision was from a previous surgery. I believe that the pt will strongly benefit from a spiritual consult.

## 2021-07-16 NOTE — Progress Notes (Signed)
Received page from RN for abdominal pain and patient feeling like "she was dying."  Patient seen at bedside.  Noted to be extremely anxious and tearful.  Pointed to suprapubic area where her pain was.  When asked how long she has had this pain, the patient states "I do not know."  The patient mostly produced unintelligible speech, therefore unable to obtain further history.  She pointed to the staples on her head several times saying that she felt like she was dying.  Discussion with RN revealed that patient's pain has been intermittent.  PureWick is intact, no issues with urination.  Exam: Vital signs are stable.  The abdomen is soft, nondistended.  Mild suprapubic tenderness to deep palpation.  No rebound/guarding.  Plan: Reassured patient at bedside.  Low concern for emergent cause of abdominal pain based on my exam.  Will relay this information to the day team.  Greatly appreciate the assistance of our nursing staff tonight.

## 2021-07-17 ENCOUNTER — Other Ambulatory Visit: Payer: Self-pay

## 2021-07-17 ENCOUNTER — Encounter (HOSPITAL_COMMUNITY): Payer: Self-pay | Admitting: Internal Medicine

## 2021-07-17 ENCOUNTER — Inpatient Hospital Stay (HOSPITAL_COMMUNITY)
Admission: RE | Admit: 2021-07-17 | Discharge: 2021-07-25 | DRG: 945 | Disposition: A | Discharge status: Home / Self Care | Payer: Medicare Other | Source: Intra-hospital | Attending: Physical Medicine & Rehabilitation | Admitting: Physical Medicine & Rehabilitation

## 2021-07-17 ENCOUNTER — Encounter (HOSPITAL_COMMUNITY): Payer: Self-pay | Admitting: Physical Medicine & Rehabilitation

## 2021-07-17 DIAGNOSIS — F32A Depression, unspecified: Secondary | ICD-10-CM | POA: Diagnosis present

## 2021-07-17 DIAGNOSIS — Z808 Family history of malignant neoplasm of other organs or systems: Secondary | ICD-10-CM

## 2021-07-17 DIAGNOSIS — I5032 Chronic diastolic (congestive) heart failure: Secondary | ICD-10-CM

## 2021-07-17 DIAGNOSIS — S065XAA Traumatic subdural hemorrhage with loss of consciousness status unknown, initial encounter: Secondary | ICD-10-CM | POA: Diagnosis present

## 2021-07-17 DIAGNOSIS — I10 Essential (primary) hypertension: Secondary | ICD-10-CM | POA: Diagnosis not present

## 2021-07-17 DIAGNOSIS — R0902 Hypoxemia: Secondary | ICD-10-CM

## 2021-07-17 DIAGNOSIS — S065XAD Traumatic subdural hemorrhage with loss of consciousness status unknown, subsequent encounter: Principal | ICD-10-CM

## 2021-07-17 DIAGNOSIS — I252 Old myocardial infarction: Secondary | ICD-10-CM | POA: Diagnosis not present

## 2021-07-17 DIAGNOSIS — F419 Anxiety disorder, unspecified: Secondary | ICD-10-CM | POA: Diagnosis present

## 2021-07-17 DIAGNOSIS — I69391 Dysphagia following cerebral infarction: Secondary | ICD-10-CM | POA: Diagnosis not present

## 2021-07-17 DIAGNOSIS — R1310 Dysphagia, unspecified: Secondary | ICD-10-CM | POA: Diagnosis present

## 2021-07-17 DIAGNOSIS — D62 Acute posthemorrhagic anemia: Secondary | ICD-10-CM | POA: Diagnosis not present

## 2021-07-17 DIAGNOSIS — D509 Iron deficiency anemia, unspecified: Secondary | ICD-10-CM | POA: Diagnosis present

## 2021-07-17 DIAGNOSIS — D649 Anemia, unspecified: Secondary | ICD-10-CM | POA: Diagnosis not present

## 2021-07-17 DIAGNOSIS — I69354 Hemiplegia and hemiparesis following cerebral infarction affecting left non-dominant side: Secondary | ICD-10-CM | POA: Diagnosis not present

## 2021-07-17 DIAGNOSIS — B029 Zoster without complications: Secondary | ICD-10-CM

## 2021-07-17 DIAGNOSIS — I110 Hypertensive heart disease with heart failure: Secondary | ICD-10-CM | POA: Diagnosis present

## 2021-07-17 DIAGNOSIS — E876 Hypokalemia: Secondary | ICD-10-CM | POA: Diagnosis present

## 2021-07-17 DIAGNOSIS — R7301 Impaired fasting glucose: Secondary | ICD-10-CM | POA: Diagnosis present

## 2021-07-17 DIAGNOSIS — E785 Hyperlipidemia, unspecified: Secondary | ICD-10-CM | POA: Diagnosis present

## 2021-07-17 DIAGNOSIS — R531 Weakness: Secondary | ICD-10-CM | POA: Diagnosis present

## 2021-07-17 DIAGNOSIS — W1830XD Fall on same level, unspecified, subsequent encounter: Secondary | ICD-10-CM | POA: Diagnosis not present

## 2021-07-17 MED ORDER — VALACYCLOVIR HCL 500 MG PO TABS
1000.0000 mg | ORAL_TABLET | Freq: Three times a day (TID) | ORAL | Status: AC
  Administered 2021-07-17 – 2021-07-20 (×8): 1000 mg via ORAL
  Filled 2021-07-17 (×8): qty 2

## 2021-07-17 MED ORDER — ADULT MULTIVITAMIN W/MINERALS CH
1.0000 | ORAL_TABLET | Freq: Every day | ORAL | Status: DC
  Administered 2021-07-18 – 2021-07-25 (×8): 1 via ORAL
  Filled 2021-07-17 (×8): qty 1

## 2021-07-17 MED ORDER — CARVEDILOL 6.25 MG PO TABS: 6.2500 mg | ORAL_TABLET | Freq: Two times a day (BID) | ORAL | Status: DC

## 2021-07-17 MED ORDER — PANTOPRAZOLE SODIUM 40 MG PO TBEC
40.0000 mg | DELAYED_RELEASE_TABLET | Freq: Every day | ORAL | Status: DC
  Administered 2021-07-18 – 2021-07-20 (×3): 40 mg via ORAL
  Filled 2021-07-17 (×3): qty 1

## 2021-07-17 MED ORDER — PROCHLORPERAZINE EDISYLATE 10 MG/2ML IJ SOLN: 5.0000 mg | Freq: Four times a day (QID) | INTRAMUSCULAR | Status: DC | PRN

## 2021-07-17 MED ORDER — BUSPIRONE HCL 5 MG PO TABS: 5.0000 mg | ORAL_TABLET | Freq: Two times a day (BID) | ORAL | 0 refills | Status: DC

## 2021-07-17 MED ORDER — POLYETHYLENE GLYCOL 3350 17 G PO PACK
17.0000 g | PACK | Freq: Every day | ORAL | Status: DC | PRN
  Filled 2021-07-17 (×2): qty 1

## 2021-07-17 MED ORDER — ADULT MULTIVITAMIN W/MINERALS CH: 1.0000 | ORAL_TABLET | Freq: Every day | ORAL | 0 refills | Status: AC

## 2021-07-17 MED ORDER — TRAZODONE HCL 50 MG PO TABS
25.0000 mg | ORAL_TABLET | Freq: Every evening | ORAL | Status: DC | PRN
  Administered 2021-07-17 – 2021-07-24 (×8): 50 mg via ORAL
  Filled 2021-07-17 (×8): qty 1

## 2021-07-17 MED ORDER — CAPSAICIN 0.025 % EX CREA: TOPICAL_CREAM | Freq: Two times a day (BID) | CUTANEOUS | 0 refills | Status: DC

## 2021-07-17 MED ORDER — BUSPIRONE HCL 10 MG PO TABS
5.0000 mg | ORAL_TABLET | Freq: Two times a day (BID) | ORAL | Status: DC
  Administered 2021-07-17: 5 mg via ORAL
  Filled 2021-07-17: qty 1

## 2021-07-17 MED ORDER — ALUM & MAG HYDROXIDE-SIMETH 200-200-20 MG/5ML PO SUSP: 30.0000 mL | ORAL | Status: DC | PRN

## 2021-07-17 MED ORDER — CARVEDILOL 6.25 MG PO TABS
6.2500 mg | ORAL_TABLET | Freq: Two times a day (BID) | ORAL | Status: DC
  Administered 2021-07-17 – 2021-07-25 (×16): 6.25 mg via ORAL
  Filled 2021-07-17 (×17): qty 1

## 2021-07-17 MED ORDER — PANTOPRAZOLE SODIUM 40 MG PO TBEC: 40.0000 mg | DELAYED_RELEASE_TABLET | Freq: Every day | ORAL | 0 refills | Status: DC

## 2021-07-17 MED ORDER — CARVEDILOL 6.25 MG PO TABS: 6.2500 mg | ORAL_TABLET | Freq: Two times a day (BID) | ORAL | 1 refills | Status: DC

## 2021-07-17 MED ORDER — ACETAMINOPHEN 325 MG PO TABS
325.0000 mg | ORAL_TABLET | ORAL | Status: DC | PRN
  Administered 2021-07-17 – 2021-07-21 (×5): 650 mg via ORAL
  Filled 2021-07-17 (×5): qty 2

## 2021-07-17 MED ORDER — CAPSAICIN 0.025 % EX CREA
TOPICAL_CREAM | Freq: Two times a day (BID) | CUTANEOUS | Status: DC
  Filled 2021-07-17 (×3): qty 60

## 2021-07-17 MED ORDER — FLEET ENEMA 7-19 GM/118ML RE ENEM
1.0000 | ENEMA | Freq: Once | RECTAL | Status: DC | PRN
Start: 2021-07-17 — End: 2021-07-25

## 2021-07-17 MED ORDER — ENSURE ENLIVE PO LIQD
237.0000 mL | Freq: Three times a day (TID) | ORAL | Status: DC
  Administered 2021-07-17 – 2021-07-24 (×22): 237 mL via ORAL

## 2021-07-17 MED ORDER — DIPHENHYDRAMINE HCL 12.5 MG/5ML PO ELIX: 12.5000 mg | ORAL_SOLUTION | Freq: Four times a day (QID) | ORAL | Status: DC | PRN

## 2021-07-17 MED ORDER — ENSURE ENLIVE PO LIQD: 237.0000 mL | Freq: Three times a day (TID) | ORAL | 12 refills | Status: DC

## 2021-07-17 MED ORDER — LEVOTHYROXINE SODIUM 25 MCG PO TABS
25.0000 ug | ORAL_TABLET | Freq: Every day | ORAL | Status: DC
  Administered 2021-07-18 – 2021-07-25 (×8): 25 ug via ORAL
  Filled 2021-07-17 (×8): qty 1

## 2021-07-17 MED ORDER — GUAIFENESIN-DM 100-10 MG/5ML PO SYRP: 5.0000 mL | ORAL_SOLUTION | Freq: Four times a day (QID) | ORAL | Status: DC | PRN

## 2021-07-17 MED ORDER — PROCHLORPERAZINE MALEATE 5 MG PO TABS: 5.0000 mg | ORAL_TABLET | Freq: Four times a day (QID) | ORAL | Status: DC | PRN

## 2021-07-17 MED ORDER — BUSPIRONE HCL 5 MG PO TABS
5.0000 mg | ORAL_TABLET | Freq: Two times a day (BID) | ORAL | Status: DC
  Administered 2021-07-17 – 2021-07-25 (×16): 5 mg via ORAL
  Filled 2021-07-17 (×16): qty 1

## 2021-07-17 MED ORDER — PROCHLORPERAZINE 25 MG RE SUPP: 12.5000 mg | Freq: Four times a day (QID) | RECTAL | Status: DC | PRN

## 2021-07-17 MED ORDER — BISACODYL 10 MG RE SUPP: 10.0000 mg | Freq: Every day | RECTAL | Status: DC | PRN

## 2021-07-17 MED ORDER — ESCITALOPRAM OXALATE 10 MG PO TABS
5.0000 mg | ORAL_TABLET | Freq: Every day | ORAL | Status: DC
  Administered 2021-07-17 – 2021-07-24 (×7): 5 mg via ORAL
  Filled 2021-07-17 (×8): qty 1

## 2021-07-17 MED ORDER — LIDOCAINE HCL URETHRAL/MUCOSAL 2 % EX GEL: CUTANEOUS | Status: DC | PRN

## 2021-07-17 MED ORDER — VALACYCLOVIR HCL 1 G PO TABS: 1000.0000 mg | ORAL_TABLET | Freq: Three times a day (TID) | ORAL | 0 refills | Status: DC

## 2021-07-17 NOTE — H&P (Signed)
Physical Medicine and Rehabilitation Admission H&P    Chief Complaint  Patient presents with   Functional deficits    HPI:  Denise Page is an 81 year old female with history of  CAS, anemia, gastric ulcer, TVAR 11/22,  HTN, delirium  and recent admission to [facility] for SDH 2/2  after fall, recurrent fall with acute on chronic SDH requiring readmission 02/08 and underwent crani X 2 but continued to have progressive decline and was transitioned to comfort care due to poor recovery/obtundation. She was d/c to Hospice on 07/08/21 but after a few days in hospice, she was noted to have awakening and started interacting with family therefore family wanted to resume full care with tube feed for nutritional support. 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   Dr. Venetia Constable consulted for input and felt that  improvement in mentation likely due to [place] which as d/c at discharge as CT head showed no change in SDH.   He recommended endovascular MMA embolization to stop further hemorrhage if patient continued to improve after Keppra metabolites washed out but felt to have poor overall long term prognosis. Mentation has been improving with increase in verbal output and she was started on D3, thins by 02/20 due to marked oral delay and mild intermittent right pocketing.  She has had issues with confusion and agitation--has pulled out her foley as well as anxiety at nights affecting sleep. MMA embolization deferred due to anesthesia risks and concerns of not being liberated from vent.   She developed blister due to herpes zoster outbreak on right buttock 02/19 and was started on Valtrex. Therapy has been working with patient who is showing improvement in activity, able to follow commands with multimodal cues as well as  cognitive linguistic ability. She continues to be limited by weakness, balance deficits with scissoring and RLE weaknes. CIR recommended due to functional decline. Daughter notes worsening delirium today.   Review of Systems  Unable to perform ROS: Mental acuity  Gastrointestinal:  Positive for abdominal pain.  Neurological:  Positive for headaches.    Past Medical History:  Diagnosis Date   Anemia    Aortic stenosis    Bilateral carotid artery disease ([place])    Enlarged thyroid    Hyperlipidemia    Hypertension    NSTEMI (non-ST elevated myocardial infarction) [facility])     Past Surgical History:  Procedure Laterality Date   BIOPSY  08/01/2020   Procedure: BIOPSY;  Surgeon: Ronnette Juniper, MD;  Location: [facility] ENDOSCOPY;  Service: Gastroenterology;;  EGD and COLON   CHOLECYSTECTOMY     COLONOSCOPY N/A 08/01/2020   Procedure: COLONOSCOPY;  Surgeon: Ronnette Juniper, MD;  Location: [facility] ENDOSCOPY;  Service: Gastroenterology;  Laterality: N/A;   CYSTECTOMY     ESOPHAGOGASTRODUODENOSCOPY (EGD) WITH PROPOFOL N/A 08/01/2020   Procedure: ESOPHAGOGASTRODUODENOSCOPY (EGD) WITH PROPOFOL;  Surgeon: Ronnette Juniper, MD;  Location: [facility] ENDOSCOPY;  Service: Gastroenterology;  Laterality: N/A;   POLYPECTOMY  08/01/2020   Procedure: POLYPECTOMY;  Surgeon: Ronnette Juniper, MD;  Location: [facility] ENDOSCOPY;  Service: Gastroenterology;;   TEE WITHOUT CARDIOVERSION N/A 11/13/2020   Procedure: TRANSESOPHAGEAL ECHOCARDIOGRAM (TEE);  Surgeon: Josue Hector, MD;  Location: [facility] ENDOSCOPY;  Service: Cardiovascular;  Laterality: N/A;   TONSILLECTOMY      Family History  Problem Relation  Age of Onset   Throat cancer Sister    Prostate cancer Brother     Social History: Plans for discharge with family assisting. Per  reports that she has never smoked. She has never used smokeless tobacco. No history on file for alcohol use and drug use.   Allergies  Allergen Reactions   Morphine And Related Anaphylaxis   Sulfa Antibiotics  Anaphylaxis   Codeine Nausea And Vomiting   Tramadol Nausea And Vomiting   Medications Prior to Admission  Medication Sig Dispense Refill   acetaminophen (TYLENOL) 650 MG CR tablet Take 650 mg by mouth every 8 (eight) hours as needed for pain.     ALPRAZolam (XANAX) 0.5 MG tablet Take 0.5 mg by mouth 2 (two) times daily.     carvedilol (COREG) 6.25 MG tablet TAKE 1 TABLET BY MOUTH TWICE DAILY with a meal (Patient taking differently: Take 6.25 mg by mouth daily.) 180 tablet 1   escitalopram (LEXAPRO) 5 MG tablet Take 5 mg by mouth at bedtime.     ferrous sulfate 325 (65 FE) MG tablet Take 325 mg by mouth in the morning and at bedtime.     folic acid (FOLVITE) 1 MG tablet Take 1 mg by mouth in the morning.     levothyroxine (SYNTHROID) 50 MCG tablet Take 50 mcg by mouth daily before breakfast.     pantoprazole (PROTONIX) 40 MG tablet Take 40 mg by mouth 2 (two) times daily.     rosuvastatin (CRESTOR) 5 MG tablet TAKE 1 TABLET BY MOUTH EVERY DAY (Patient taking differently: Take 5 mg by mouth daily.) 90 tablet 1   sucralfate (CARAFATE) 1 g tablet Take 1 g by mouth 2 (two) times daily.     vitamin B-12 (CYANOCOBALAMIN) 1000 MCG tablet Take 1,000 mcg by mouth daily.     cephALEXin (KEFLEX) 500 MG capsule Take 1 capsule (500 mg total) by mouth 4 (four) times daily. (Patient not taking: Reported on 07/10/2021) 28 capsule 0   levothyroxine (SYNTHROID) 25 MCG tablet Take 25 mcg by mouth every morning. (Patient not taking: Reported on 07/10/2021)     lisinopril (ZESTRIL) 5 MG tablet TAKE 1 TABLET BY MOUTH EVERY DAY (Patient not taking: Reported on 07/10/2021) 90 tablet 1      Home: [place] expects to be discharged to:: Private residence Living Arrangements: Children Available Help at Discharge: Family Type of Home: House Home Access: Stairs to enter [place]: One level Bathroom Shower/Tub: Chiropodist: Standard Bathroom Accessibility: Yes Additional  Comments: daughter at bedside and reports the plan is for her to reutrn home to family. Stays with Son.   Functional History: Prior Function Prior Level of Function : Needs assist Mobility Comments: pt ambulated without assistive device, fell going to mailbox which resulted in initial SDH ADLs Comments: reports that patient was able to stand pivot to bathroom surfaces prior to admission  Functional Status:  Mobility: Bed Mobility Overal bed mobility: Needs Assistance Bed Mobility: Supine to Sit, Sit to Supine Rolling: Max assist Supine to sit: Supervision Sit to supine: Min guard General bed mobility comments: Pt progressing to right side of bed with no physical assist. Cues for scooting forward to edge of bed Transfers Overall transfer level: Needs assistance Equipment used: Rolling walker (2 wheels) Transfers: Sit to/from Stand, Bed to chair/wheelchair/BSC Sit to Stand: +2 safety/equipment Bed to/from chair/wheelchair/BSC transfer type:: Stand pivot Stand pivot transfers: Min assist, +2 physical assistance General transfer comment: MinA + 2 to rise  from bed and pivot to [facility], cues for reaching back to control eccentric descent Ambulation/Gait Ambulation/Gait assistance: +2 safety/equipment, Min assist Gait Distance (Feet): 20 Feet (10 ft x 2) Assistive device: Rolling walker (2 wheels) Gait Pattern/deviations: Step-through pattern, Decreased stride length, Narrow base of support, Scissoring General Gait Details: Pt ambulating 10 ft x 2 with minA for balance (+2 for chair follow), with one seated rest break. Pt with intermittent scissoring, narrow BOS, and mild R knee buckle. Gait velocity: decreased Gait velocity interpretation: <1.8 ft/sec, indicate of risk for recurrent falls    ADL: ADL Overall ADL's : Needs assistance/impaired Grooming: Moderate assistance, Sitting Toilet Transfer: +2 for physical assistance, Minimal assistance, Stand-pivot, BSC/3in1 Toileting- Clothing  Manipulation and Hygiene: Total assistance, Sit to/from stand Toileting - Clothing Manipulation Details (indicate cue type and reason): pt initiates the stand and total (A) for the peri care. pt with stedy assistance from second therapist Functional mobility during ADLs: +2 for physical assistance, Minimal assistance, Rolling walker (2 wheels) General ADL Comments: pt sits quickly without much cues when fatigued  Cognition: Cognition Overall Cognitive Status: Impaired/Different from baseline Arousal/Alertness: Awake/alert (aphasia prevents thorough assessment) Orientation Level: Oriented to person, Disoriented to situation, Disoriented to time, Disoriented to place Cognition Arousal/Alertness: Awake/alert Behavior During Therapy: Flat affect Overall Cognitive Status: Impaired/Different from baseline Area of Impairment: Attention, Memory, Following commands, Safety/judgement, Awareness, Problem solving Current Attention Level: Sustained Following Commands: Follows one step commands inconsistently Safety/Judgement: Decreased awareness of safety, Decreased awareness of deficits Awareness: Intellectual Problem Solving: Slow processing, Decreased initiation, Requires verbal cues, Requires tactile cues, Difficulty sequencing General Comments: Benefits from one step, simple motor cues. very pleasant, smiling, decreased verbalizations 2/2 aphasia. pt responds well to counting to help cue   Blood pressure (!) 143/72, pulse 98, temperature 98 F (36.7 C), temperature source Oral, resp. rate 18, height 5\' 8"  (1.727 m), weight 74.6 kg, SpO2 96 %. Physical Exam Constitutional:      Comments: NAD. Resolving ecchymosis  left cheek   HENT:     Head:     Comments: Left crani incision with staples and sutures intact Cardiac: tachycardic Pulmonary: breathing comfortably Abdomen: NT, ND Neurological/MSK    Mental Status: She is alert.     Comments: Oriented to self, able to state age and DOB, son and  daughter's name but had limited output with language of confusion. Left inattention. Right pronator drift and had difficulty following 2 step commands due to mental status Skin: staples on crani site c/d/i  Results for orders placed or performed during the hospital encounter of 07/10/21 (from the past 48 hour(s))  CBC with Differential/Platelet     Status: Abnormal   Collection Time: 07/16/21 11:44 AM  Result Value Ref Range   WBC 6.2 4.0 - 10.5 K/uL   RBC 3.06 (L) 3.87 - 5.11 MIL/uL   Hemoglobin 8.6 (L) 12.0 - 15.0 g/dL   HCT 26.5 (L) 36.0 - 46.0 %   MCV 86.6 80.0 - 100.0 fL   MCH 28.1 26.0 - 34.0 pg   MCHC 32.5 30.0 - 36.0 g/dL   RDW 14.7 11.5 - 15.5 %   Platelets 212 150 - 400 K/uL   nRBC 0.0 0.0 - 0.2 %   Neutrophils Relative % 60 %   Neutro Abs 3.7 1.7 - 7.7 K/uL   Lymphocytes Relative 21 %   Lymphs Abs 1.3 0.7 - 4.0 K/uL   Monocytes Relative 14 %   Monocytes Absolute 0.9 0.1 - 1.0 K/uL  Eosinophils Relative 1 %   Eosinophils Absolute 0.0 0.0 - 0.5 K/uL   Basophils Relative 0 %   Basophils Absolute 0.0 0.0 - 0.1 K/uL   Immature Granulocytes 4 %   Abs Immature Granulocytes 0.27 (H) 0.00 - 0.07 K/uL    Comment: Performed at [facility] [address] [place], [place] [address]  Basic metabolic panel     Status: Abnormal   Collection Time: 07/16/21 11:44 AM  Result Value Ref Range   Sodium 139 135 - 145 mmol/L   Potassium 3.9 3.5 - 5.1 mmol/L   Chloride 106 98 - 111 mmol/L   CO2 22 22 - 32 mmol/L   Glucose, Bld 130 (H) 70 - 99 mg/dL    Comment: Glucose reference range applies only to samples taken after fasting for at least 8 hours.   BUN 12 8 - 23 mg/dL   Creatinine, Ser 0.90 0.44 - 1.00 mg/dL   Calcium 9.1 8.9 - 10.3 mg/dL   GFR, Estimated >60 >60 mL/min    Comment: (NOTE) Calculated using the CKD-EPI Creatinine Equation (2021)    Anion gap 11 5 - 15    Comment: Performed at [facility] [address], [place], [place] [address]   No results  found.    Blood pressure (!) 143/72, pulse 98, temperature 98 F (36.7 C), temperature source Oral, resp. rate 18, height 5\' 8"  (1.727 m), weight 74.6 kg, SpO2 96 %.  Medical Problem List and Plan: 1. Functional deficits secondary to subdural hematoma s/p crani.   -patient may shower  -ELOS/Goals: S 10-14 days  -Admit to CIR 2.  Antithrombotics: -DVT/anticoagulation:  Mechanical: Sequential compression devices, below knee Bilateral lower extremities  -antiplatelet therapy: N/A 3. Pain Management: Tylenol prn.  4. Mood: LCSW to follow for evaluation and support when appropriate.   -antipsychotic agents: N/A 5. Neuropsych: This patient is not fully capable of making decisions on her own behalf. 6. Skin/Wound Care: Routine pressure relief measures.  7. Fluids/Electrolytes/Nutrition: Monitor I/O. Check lytes in am. 8. HTN: magnesium reviewed and stable. Add magnesium gluconate 250mg  HS to assist blood pressure control- systolic elevated 9. Chronic diastolic CHF/TVAR 06/00: on Coreg bid.  Check weights daily.  --Monitor for signs of overload.  --add low salt restrictions.  10. Herpes Zoster: Continue Valtrex. Continue airborne precautions. 11. H/o depression/anxiety: Continue Lexapro and buspar to manage symptoms. --assess sleep wake cycle.   12. H/o PUD/acute on chronic anemia: Will recheck CBC in am.  13. Impaired fasting glucose: will continue to monitor FBS with routine labs.  14. Delirium: continue safety measures with tele monitor. Discussed benefits of telesitter with daugghter.  15.  Abdominal pain: Urinary retention? Will order bladder scans/check PVRs. Add magnesium gluconate 250mg  HS to keep bowels moving regularly.   I have personally performed a face to face diagnostic evaluation, including, but not limited to relevant history and physical exam findings, of this patient and developed relevant assessment and plan.  Additionally, I have reviewed and concur with the physician  assistant's documentation above.  Leeroy Cha, MD  Bary Leriche, PA-C 07/17/2021

## 2021-07-17 NOTE — Progress Notes (Addendum)
Signed                                                                                                                                                                                                                                                                                                                                                                                                                                                                                               PMR Admission Coordinator Pre-Admission Assessment   Patient: Denise Page is an 81 y.o., female MRN: [id number] DOB: 1940-10-21 Height: 5\' 8"  (172.7 cm) Weight: 74.6 kg   Insurance Information HMO:     PPO:      PCP:      IPA:      80/20: yes     OTHER:  PRIMARY: Medicare A and B      Policy#: [id number]      Subscriber: Pt Medicare       Policy#:       Subscriber: Pt. Phone#: Verified online    Fax#:  Pre-Cert#:       Employer:  Benefits:  Phone #:  Name:  Eff. Date: Parts A effective 11/21/2005 and B  06/25/2019 Deduct: $1600      Out of Pocket Max:  None      Life Max: N/A  CIR: 100%      SNF: 100 days Outpatient: 80%     Page-Pay: 20% Home Health: 100%      Page-Pay: none DME: 80%     Page-Pay: 20% Providers: patient's choice Providers: in network  SECONDARY: Medicaid of  [place]      Policy#: [id number] s     Phone#:    Development worker, community:       Phone#:    The Actuary for patients in Inpatient Rehabilitation Facilities with attached Privacy Act [place] Records was provided and verbally reviewed with: Patient   Emergency Contact Information Contact Information       Name Relation Home Work [place] Daughter [phone]   [phone]    Denise Page     [phone]           Current  Medical History  Patient Admitting Diagnosis: SDH, Debility History of Present Illness: Denise Page is a 81 y.o. female with a PMH of: 2 craniotomies for evacuation, gastric ulcers, HLD, HTN, facial fxs, AVR. Pt presented to [facility] ED 07/10/21 after family decided to remove her from hospice care due to an improvement in status. Patient presented to [facility] atrium on 02/02 after sustaining injuries from a fall.  She fell while walking outside her mailbox.  Her injuries were a left orbital floor fracture, subdural hematoma with a 5 mm shift, acute on chronic T8 fracture and a nondisplaced right mandibular subcondylar fracture.  Trauma as well as neurosurgery were consulted.  ENT was consulted for her mandibular fracture and ophthalmology was consulted for an orbital fracture.  All of her fractures were managed nonoperatively. During this time she was started on Keppra for seizure prophylaxis. During this time she was started on Keppra for seizure prophylaxis  Ultimately she was placed in hospice care with a DNR/ DNI on 07/07/21.Marland Kitchen She was in hospice for 3-4 days, during which time Keppra was discontinued. Pt. Family noted improvement in mental status and brought her to [facility] for treatment, opting to rescind hospice care. In the ED, Patient was started on IV dextrose 10% continuous infusion 75 mL/h and CT head was obtained revealing recurrent hemorrhage.  Chest x-ray revealed cardiomegaly. Pt. Also found to have active shingles, placed on airborne precautions. PT/OT saw this pt. And recommended CIR to assist return to PLOF.      Patient's medical record from [facility] has been reviewed by the rehabilitation admission coordinator and physician.   Past Medical History      Past Medical History:  Diagnosis Date   Anemia     Aortic stenosis     Bilateral carotid artery disease (HCC)     Enlarged thyroid     Hyperlipidemia     Hypertension     NSTEMI (non-ST elevated myocardial  infarction) [facility], [facility])        Has the patient had major surgery during 100 days prior to admission? Yes   Family History   family history includes Prostate cancer in her brother; Throat cancer in her sister.   Current Medications   Current Facility-Administered Medications:    acetaminophen (TYLENOL) suppository 650 mg, 650 mg, Rectal, Q4H PRN, Rick Duff, MD, 650 mg at 07/11/21 1811   acetaminophen (TYLENOL) tablet 650 mg, 650 mg,  Oral, Q6H PRN, Rick Duff, MD, 650 mg at 07/16/21 0454   busPIRone (BUSPAR) tablet 5 mg, 5 mg, Oral, BID, Timothy Lasso, MD   capsaicin (ZOSTRIX) 0.025 % cream, , Topical, BID, Katsadouros, Vasilios, MD, Given at 07/16/21 2209   carvedilol (COREG) tablet 6.25 mg, 6.25 mg, Oral, BID WC, Timothy Lasso, MD   Chlorhexidine Gluconate Cloth 2 % PADS 6 each, 6 each, Topical, Daily, Lucious Groves, DO, 6 each at 07/16/21 1509   escitalopram (LEXAPRO) tablet 5 mg, 5 mg, Oral, QHS, Hoffman, Erik C, DO, 5 mg at 07/16/21 2210   feeding supplement (ENSURE ENLIVE / ENSURE PLUS) liquid 237 mL, 237 mL, Oral, TID BM, Hoffman, Erik C, DO, 237 mL at 07/17/21 1004   levothyroxine (SYNTHROID) tablet 25 mcg, 25 mcg, Oral, Q0600, Timothy Lasso, MD, 25 mcg at 07/17/21 0600   MEDLINE mouth rinse, 15 mL, Mouth Rinse, BID, Lucious Groves, DO, 15 mL at 07/15/21 2114   multivitamin with minerals tablet 1 tablet, 1 tablet, Oral, Daily, Lucious Groves, DO, 1 tablet at 07/15/21 0851   pantoprazole (PROTONIX) EC tablet 40 mg, 40 mg, Oral, Daily, Timothy Lasso, MD, 40 mg at 07/15/21 1620   valACYclovir (VALTREX) tablet 1,000 mg, 1,000 mg, Oral, TID, Timothy Lasso, MD, 1,000 mg at 07/16/21 2209   Patients Current Diet:  Diet Order                  DIET DYS 3 Room service appropriate? Yes; Fluid consistency: Thin  Diet effective now                         Precautions / Restrictions Precautions Precautions: Fall, Other (comment) Precaution Comments:  aphasic Restrictions Weight Bearing Restrictions: No    Has the patient had 2 or more falls or a fall with injury in the past year? Yes   Prior Activity Level Limited Community (1-2x/wk): Pt. went out for appointments only   Prior Functional Level Self Care: Did the patient need help bathing, dressing, using the toilet or eating? Independent   Indoor Mobility: Did the patient need assistance with walking from room to room (with or without device)? Independent   Stairs: Did the patient need assistance with internal or external stairs (with or without device)? Independent   Functional Cognition: Did the patient need help planning regular tasks such as shopping or remembering to take medications? Independent   Patient Information Are you of Hispanic, Latino/a,or Spanish origin?: A. No, not of Hispanic, Latino/a, or Spanish origin What is your race?: A. White Do you need or want an interpreter to communicate with a doctor or health care staff?: 2. No   Patient's Response To:  Health Literacy and Transportation Is the patient able to respond to health literacy and transportation needs?: Yes Health Literacy - How often do you need to have someone help you when you read instructions, pamphlets, or other written material from your doctor or pharmacy?: Sometimes In the past 12 months, has lack of transportation kept you from medical appointments or from getting medications?: Yes In the past 12 months, has lack of transportation kept you from meetings, work, or from getting things needed for daily living?: Yes   Home Assistive Devices / [place] Devices/Equipment: Other (Comment) (pt unable to answer)   Prior Device Use: Indicate devices/aids used by the patient prior to current illness, exacerbation or injury? None of the above   Current Functional Level Cognition  Arousal/Alertness: Awake/alert (aphasia prevents thorough assessment) Overall Cognitive Status:  Impaired/Different from baseline Current Attention Level: Sustained Orientation Level: Oriented to person, Disoriented to situation, Disoriented to time, Disoriented to place Following Commands: Follows one step commands inconsistently Safety/Judgement: Decreased awareness of safety, Decreased awareness of deficits General Comments: Benefits from one step, simple motor cues. very pleasant, smiling, decreased verbalizations 2/2 aphasia. pt responds well to counting to help cue    Extremity Assessment (includes Sensation/Coordination)   Upper Extremity Assessment: Generalized weakness RUE Deficits / Details: pt demonstrates 3/5 grip strenght, 4-/5 elbow flexors and shoulder extensors when attempting to pull to avoid posterior LOB. Difficult to formally assess 2/2 cognitive and communication deficits  Lower Extremity Assessment: RLE deficits/detail RLE Deficits / Details: RLE with flickers of movement from quads and dorsiflexors noted. Difficult to formally assess due to communication deficits.     ADLs   Overall ADL's : Needs assistance/impaired Grooming: Moderate assistance, Sitting Toilet Transfer: +2 for physical assistance, Minimal assistance, Stand-pivot, BSC/3in1 Toileting- Clothing Manipulation and Hygiene: Total assistance, Sit to/from stand Toileting - Clothing Manipulation Details (indicate cue type and reason): pt initiates the stand and total (A) for the peri care. pt with stedy assistance from second therapist Functional mobility during ADLs: +2 for physical assistance, Minimal assistance, Rolling walker (2 wheels) General ADL Comments: pt sits quickly without much cues when fatigued     Mobility   Overal bed mobility: Needs Assistance Bed Mobility: Supine to Sit, Sit to Supine Rolling: Max assist Supine to sit: Supervision Sit to supine: Min guard General bed mobility comments: Pt progressing to right side of bed with no physical assist. Cues for scooting forward to edge of  bed     Transfers   Overall transfer level: Needs assistance Equipment used: Rolling walker (2 wheels) Transfers: Sit to/from Stand, Bed to chair/wheelchair/BSC Sit to Stand: +2 safety/equipment Bed to/from chair/wheelchair/BSC transfer type:: Stand pivot Stand pivot transfers: Min assist, +2 physical assistance General transfer comment: MinA + 2 to rise from bed and pivot to [facility], cues for reaching back to control eccentric descent     Ambulation / Gait / Stairs / Wheelchair Mobility   Ambulation/Gait Ambulation/Gait assistance: +2 safety/equipment, Min assist Gait Distance (Feet): 20 Feet (10 ft x 2) Assistive device: Rolling walker (2 wheels) Gait Pattern/deviations: Step-through pattern, Decreased stride length, Narrow base of support, Scissoring General Gait Details: Pt ambulating 10 ft x 2 with minA for balance (+2 for chair follow), with one seated rest break. Pt with intermittent scissoring, narrow BOS, and mild R knee buckle. Gait velocity: decreased Gait velocity interpretation: <1.8 ft/sec, indicate of risk for recurrent falls     Posture / Balance Balance Overall balance assessment: Needs assistance Sitting-balance support: Feet supported Sitting balance-Leahy Scale: Fair Standing balance support: No upper extremity supported, During functional activity Standing balance-Leahy Scale: Poor Standing balance comment: reliant on BUE support of RW     Special needs/care consideration Skin Abrasion: arm/right, upper; Blister: buttocks/right; Ecchymosis: abdomen, hand, face/right, left; Rash: groin/bilateral, Bowel and Bladder incontinence; Airborne precautions    Previous Home Environment (from acute therapy documentation) Living Arrangements: Children Available Help at Discharge: Family Type of Home: [place] Name: [facility] Home Layout: One level Home Access: Stairs to enter Bathroom Shower/Tub: Chiropodist:  Standard Bathroom Accessibility: Yes How Accessible: Accessible via walker [place]: No Additional Comments: daughter at bedside and reports the plan is for her to reutrn home to family. Stays with Son.  Discharge Living Setting Plans for Discharge Living Setting: Patient's home Type of Home at Discharge: House Discharge Home Layout: One level Discharge Home Access: Stairs to enter Entrance Stairs-Rails: Right Entrance Stairs-Number of Steps: 4 Discharge Bathroom Shower/Tub: Tub/shower unit Discharge Bathroom Toilet: Standard Discharge Bathroom Accessibility: Yes How Accessible: Accessible via walker Does the patient have any problems obtaining your medications?: No   Social/Family/Support Systems Patient Roles: Spouse Contact Information: [phone] Anticipated Caregiver: Royston Bake (son) Anticipated Caregiver's Contact Information: [phone] Ability/Limitations of Caregiver: can provide min A Caregiver Availability: 24/7 Discharge Plan Discussed with Primary Caregiver: Yes Is Caregiver In Agreement with Plan?: Yes Does Caregiver/Family have Issues with Lodging/Transportation while Pt is in Rehab?: No   Goals Patient/Family Goal for Rehab: PT/OT/SLP Supervision Expected length of stay: 10-12 days Pt/Family Agrees to Admission and willing to participate: Yes Program Orientation Provided & Reviewed with Pt/Caregiver Including Roles  & Responsibilities: Yes   Decrease burden of Care through IP rehab admission: Diet advancement, Decrease number of caregivers, Bowel and bladder program, and Patient/family education   Possible need for SNF placement upon discharge: not anticipated    Patient Condition: I have reviewed medical records from [facility], spoken with CM, and patient and daughter. I discussed via phone for inpatient rehabilitation assessment.  Patient will benefit from ongoing PT, OT, and SLP, can actively participate in 3 hours of  therapy a day 5 days of the week, and can make measurable gains during the admission.  Patient will also benefit from the coordinated team approach during an Inpatient Acute Rehabilitation admission.  The patient will receive intensive therapy as well as Rehabilitation physician, nursing, social worker, and care management interventions.  Due to bladder management, bowel management, safety, skin/wound care, disease management, medication administration, pain management, and patient education the patient requires 24 hour a day rehabilitation nursing.  The patient is currently Min A +2 with mobility and Min +2-Total A with basic ADLs.  Discharge setting and therapy post discharge at home with home health is anticipated.  Patient has agreed to participate in the Acute Inpatient Rehabilitation Program and will admit today.   Preadmission Screen Completed By:  Genella Mech, with updates by Gayland Curry 07/17/2021 11:04 AM ______________________________________________________________________   Discussed status with Dr. Ranell Patrick on 07/17/21  at 11:04 AM and received approval for admission today.   Admission Coordinator:  Genella Mech, CCC-SLP, with updates by Gayland Curry, MS, CCC-SLP time 11:04 AM/Date 07/17/21     Assessment/Plan: Diagnosis: Subdural hematoma Does the need for close, 24 hr/day Medical supervision in concert with the patient's rehab needs make it unreasonable for this patient to be served in a less intensive setting? Yes Page-Morbidities requiring supervision/potential complications: overweight, severe protein calorie malnutrition, hypothyroidism, pulmonary HTN, nonrheumatic aortic valve stenosis Due to bladder management, bowel management, safety, skin/wound care, disease management, medication administration, pain management, and patient education, does the patient require 24 hr/day rehab nursing? Yes Does the patient require coordinated care of a physician, rehab nurse,  PT, OT to address physical and functional deficits in the context of the above medical diagnosis(es)? Yes Addressing deficits in the following areas: balance, endurance, locomotion, strength, transferring, bowel/bladder control, bathing, dressing, feeding, grooming, toileting, and psychosocial support Can the patient actively participate in an intensive therapy program of at least 3 hrs of therapy 5 days a week? Yes The potential for patient to make measurable gains while on inpatient rehab is excellent Anticipated functional outcomes upon discharge from inpatient rehab:  supervision PT, supervision OT, supervision SLP Estimated rehab length of stay to reach the above functional goals is: 10-14 dyas Anticipated discharge destination: Home 10. Overall Rehab/Functional Prognosis: excellent     MD Signature: Leeroy Cha, MD

## 2021-07-17 NOTE — Progress Notes (Signed)
° °  Palliative Medicine Inpatient Follow Up Note  HPI: 81 y.o. female  with past medical history of fall in yard at home (06/23/2021) with subsequent recurrent subdural hematoma s/p craniotomy [facility]). With minimal improvement at [facility], family transitioned patient to [facility] Inpatient unit. Once in hospice care and all medications not focused on comfort were stopped, family noticed patient was tracking with eyes and squeezing hands. Family decided to leave hospice and come to the hospital to pursue medical interventions. Pt was admitted on 07/10/2021 wherein CT of head revealed rebleeding of hematoma. Neuro was consulted and recommends AED washout for 1-2 days before evaluating for possible embolization.    As per notes, neuro has met with patient and her daughter/decision maker Denise Page to share that embolization is a possibility but will not provide immediate or improved mass effect.   Today's Discussion (07/17/2021):  *Please note that this is a verbal dictation therefore any spelling or grammatical errors are due to the "[place] One" system interpretation.  Chart reviewed inclusive of vital signs, progress notes, laboratory results, and diagnostic images.   I met with Denise Page at bedside this morning.  Her daughter, Denise Page was present.  Denise Page shares that Denise Page has had some agitation this morning which they are working past.  Reviewed hospital associated delirium and how patients can have a fluctuation in their mental state throughout the course of the day.  Denise Page seem to understand this.  I was able to help the day RN, Denise Page assist Denise Page out of bed to the recliner chair.  Denise Page did very well and was steady on her feet able to follow direction to mobilize to the chair and slowly let her self down.  We reviewed the plan for her to go to CIR and that she will hopefully fare well there prior to transitioning home.  Questions and concerns answered.  Daughter aware of her contact  information should additional palliative support be needed into the future.  Objective Assessment: Vital Signs Vitals:   07/16/21 1914 07/17/21 0241  BP: 131/77 (!) 143/72  Pulse: 99 98  Resp: 18 18  Temp: 98.8 F (37.1 C) 98 F (36.7 C)  SpO2: 96% 96%    Intake/Output Summary (Last 24 hours) at 07/17/2021 1258 Last data filed at 07/17/2021 1000 Gross per 24 hour  Intake 600 ml  Output 750 ml  Net -150 ml   Last Weight  Most recent update: 07/17/2021  2:49 AM    Weight  74.6 kg (164 lb 7.4 oz)            Gen: Elderly Caucasian female in no acute distress HEENT: moist mucous membranes CV: Irregular rate and rhythm  PULM: On room air breathing is even and unlabored ABD: soft/nontender EXT: No edema Neuro: Opens eyes does not follow commands  SUMMARY OF RECOMMENDATIONS   Partial code - no chest compressions, no intubation.   Family remains optimistic for ongoing improvements  Plan for CIR   Palliative care will sign off  MDM - Moderate ______________________________________________________________________________________ [place] Team Team Cell Phone: [phone] Please utilize secure chat with additional questions, if there is no response within 30 minutes please call the above phone number  Palliative Medicine Team providers are available by phone from 7am to 7pm daily and can be reached through the team cell phone.  Should this patient require assistance outside of these hours, please call the patient's attending physician.

## 2021-07-17 NOTE — Discharge Summary (Addendum)
Name: Denise Page MRN: [id number] DOB: [birth year] 81 y.o. PCP: Denise Page  Date of Admission: 07/10/2021  1:36 PM Date of Discharge: 07/17/21 Attending Physician: Denise Groves, DO  Discharge Diagnosis: 1. Principal Problem:   Subdural hematoma Active Problems:   Protein-calorie malnutrition, severe   Hypothyroidism   Hypertension   GAD   Normocytic anemia   Herpes Zoster     Discharge Medications: Allergies as of 07/17/2021       Reactions   Morphine And Related Anaphylaxis   Sulfa Antibiotics Anaphylaxis   Codeine Nausea And Vomiting   Tramadol Nausea And Vomiting        Medication List     STOP taking these medications    ALPRAZolam 0.5 MG tablet Commonly known as: XANAX   cephALEXin 500 MG capsule Commonly known as: KEFLEX   lisinopril 5 MG tablet Commonly known as: ZESTRIL       TAKE these medications    acetaminophen 650 MG CR tablet Commonly known as: TYLENOL Take 650 mg by mouth every 8 (eight) hours as needed for pain.   busPIRone 5 MG tablet Commonly known as: BUSPAR Take 1 tablet (5 mg total) by mouth 2 (two) times daily.   capsaicin 0.025 % cream Commonly known as: ZOSTRIX Apply topically 2 (two) times daily.   carvedilol 6.25 MG tablet Commonly known as: COREG Take 1 tablet (6.25 mg total) by mouth 2 (two) times daily with a meal. What changed: when to take this   escitalopram 5 MG tablet Commonly known as: LEXAPRO Take 5 mg by mouth at bedtime.   feeding supplement Liqd Take 237 mLs by mouth 3 (three) times daily between meals.   ferrous sulfate 325 (65 FE) MG tablet Take 325 mg by mouth in the morning and at bedtime.   folic acid 1 MG tablet Commonly known as: FOLVITE Take 1 mg by mouth in the morning.   levothyroxine 50 MCG tablet Commonly known as: SYNTHROID Take 50 mcg by mouth daily before breakfast. What changed: Another medication with the same name was removed. Continue taking this  medication, and follow the directions you see here.   multivitamin with minerals Tabs tablet Take 1 tablet by mouth daily. Start taking on: July 18, 2021   pantoprazole 40 MG tablet Commonly known as: PROTONIX Take 1 tablet (40 mg total) by mouth daily. Start taking on: July 18, 2021 What changed: when to take this   rosuvastatin 5 MG tablet Commonly known as: CRESTOR TAKE 1 TABLET BY MOUTH EVERY DAY   sucralfate 1 g tablet Commonly known as: CARAFATE Take 1 g by mouth 2 (two) times daily.   valACYclovir 1000 MG tablet Commonly known as: VALTREX Take 1 tablet (1,000 mg total) by mouth 3 (three) times daily for 3 days.   vitamin B-12 1000 MCG tablet Commonly known as: CYANOCOBALAMIN Take 1,000 mcg by mouth daily.               Discharge Care Instructions  (From admission, onward)           Start     Ordered   07/17/21 0000  Discharge wound care:       Comments: Keep wound covered at all times.  Change dressing once a day. Sponge bathes until wounds crust over.   07/17/21 1339            Disposition and follow-up:   DeniseZionna Page was discharged from [facility] in [place] condition.  At the hospital follow up visit please address:  1.  Follow up with PCP for medication reconciliation; continue medications that were restarted on hospitalization; Repeat CBC, hgb in the setting of known intracranial bleed. No further head imaging, per neurosurgery.  2.  Labs / imaging needed at time of follow-up: CBC  3.  Pending labs/ test needing follow-up: None   Follow-up Appointments:   Hospital Course by problem list: 1. Subdural hematoma: Patient presented from hospice after sustaining subdural hematoma after mechanical fall. Patient underwent two craniotomies with poor recovery. Patient was deemed a candidate for hospice and was placed there for several days. Family noted patient began to show some neurologic improvement. Family took  patient out of hospice and came to [facility] health. CT head was obtained in the ED with revealed re-bleed of subdural hematoma. Neurosurgery was consulted and deemed patient a possible candidate for MMA embolization. It was thought to be the reason for neurologic improvement secondary to discontinuation of Keppra, which was used as prophylaxis in setting of intracranial bleeding. Family and neurosurgery came to the conclusion that embolization may not contribute to overall poor prognosis and risk that patient would not be able to be extubated. Supportive care provided throughout hospitalization, no further imagining indicated. Patient worked with PT/OT and recommendation was CIR. And to eventually discharge home, per family goal. 2. Protein-calorie malnutrition, severe: Prior to admission, patient was in hospice. No food or fluids were given. In the ED, patient was started on D5 fluids and NPO in lieu of discussion of goals of care. SLP evaluation recommended Dysphagia 1 diet. As patient continued to show neurologic improvement, diet was advanced to Dysphagia 3 with nutritional supplementation including Ensure, vitamins and meals. 3. Hypothyroidism- prior to hospice care, patient has hx of hypothyroidism taking synthroid 25 mcg daily. All medication discontinued during hospice care. On admission, TSH level was obtained was elevated at 12. After goals of care discussion, patient was resumed on home dose synthroid 25 mcg daily. 4. Hypertension- Home medications include lisinopril 5mg  daily and carvedilol 6.25mg  BID. On admission, patient was normotensive w/o medication. She remained that way for several days. Once patient continued to have neurologic improvement and became more active with PT/OT, BP began to elevate. SBP 130s-140s. HR in the upper limits of normal, ~90s. Creatinine level within reverence range. Patient was resumed on home medication coreg 6.25 BID. 5. Generalized anxiety disorder- Prior to hospice  care, patient home medication included ativan PRN. Given patient's age, underlying intracranial condition, ativan not appropriate treatment for anxiety. As patient continued to show neurologic improvement, she was noted to become more anxious as she began no be more aware of her surroundings. No agitation noted, just anxiousness. Patient was started on lexapro from long term treatment of anxiety. Buspar 5mg  BID was also added to treat acute anxiety episode, given lexapro tkes several weeks for full effect. 6. Normocytic anemia- Patient has known hx of anemia and was previously treated for iron def anemia many years ago. Since then, patient has been chronically anemic, asymptomatic. On admission, patient's hgb of 9. Throughout hospital course, CC was trended and hgb stable ~8.6. Patient has known active slow bleed intracranially. Supportive care at this time. Patient remained asymptomatic. 7. Herpes Zoster-during hospitalization, patient developed herpes zoster on right buttocks in dermatomal distribution.  She received Tylenol as needed for pain control.  She was started on valacyclovir 1000 mg 3 times daily and will complete medication regimen in 7 days, end date 07/20/2021.  She  was placed on airborne precautions.  At time of discharge, the vesicles has erupted, clean and dry;area was covered with sealed bandage.  Discharge Exam:   BP (!) 143/72 (BP Location: Left Arm)    Pulse 98    Temp 98 F (36.7 C) (Oral)    Resp 18    Ht 5\' 8"  (1.727 m)    Wt 74.6 kg    SpO2 96%    BMI 25.01 kg/m  Discharge exam: Physical Exam Constitutional:      General: She is awake.  Cardiovascular:     Rate and Rhythm: Normal rate and regular rhythm.     Heart sounds: Normal heart sounds.  Pulmonary:     Effort: Pulmonary effort is normal.     Breath sounds: Normal air entry.  Skin:    General: Skin is warm and dry.  Neurological:     Mental Status: She is alert.     Comments: Speaking in full sentences. Word  finding, cooperative, alert  Psychiatric:        Mood and Affect: Mood is anxious.        Behavior: Behavior is cooperative.     Pertinent Labs, Studies, and Procedures:  CBC Latest Ref Rng & Units 07/16/2021 07/11/2021 07/10/2021  WBC 4.0 - 10.5 K/uL 6.2 5.6 4.7  Hemoglobin 12.0 - 15.0 g/dL 8.6(L) 8.7(L) 9.0(L)  Hematocrit 36.0 - 46.0 % 26.5(L) 27.2(L) 28.0(L)  Platelets 150 - 400 K/uL 212 268 250   BMP Latest Ref Rng & Units 07/16/2021 07/11/2021 07/10/2021  Glucose 70 - 99 mg/dL 130(H) 159(H) 82  BUN 8 - 23 mg/dL 12 13 13   Creatinine 0.44 - 1.00 mg/dL 0.90 0.75 0.84  Sodium 135 - 145 mmol/L 139 136 139  Potassium 3.5 - 5.1 mmol/L 3.9 3.2(L) 3.8  Chloride 98 - 111 mmol/L 106 106 107  CO2 22 - 32 mmol/L 22 20(L) 18(L)  Calcium 8.9 - 10.3 mg/dL 9.1 8.4(L) 8.5(L)   CT Head Wo Contrast  Result Date: 07/10/2021 CLINICAL DATA:  Recent subdural hematoma, increased confusion EXAM: CT HEAD WITHOUT CONTRAST TECHNIQUE: Contiguous axial images were obtained from the base of the skull through the vertex without intravenous contrast. RADIATION DOSE REDUCTION: This exam was performed according to the departmental dose-optimization program which includes automated exposure control, adjustment of the mA and/or kV according to patient size and/or use of iterative reconstruction technique. COMPARISON:  07/01/2021 FINDINGS: Brain: No evidence of acute infarction or hydrocephalus. Redemonstrated mixed attenuation left hemispheric subdural hematoma, which is unchanged in maximum thickness at 1.5 cm, however decreased in overall volume about the left hemisphere. There is however new hyperdense, acute appearing subdural blood product overlying the posterior left frontal lobe and parietal lobe (series 4, image 30). Postoperative pneumocephalus. Severe left right midline shift is not significantly changed at 1.4 cm (series 3, image 20). Underlying periventricular and deep white matter hypodensity. Vascular: No  hyperdense vessel or unexpected calcification. Skull: Status post left frontoparietal craniotomy. Negative for fracture or focal lesion. Sinuses/Orbits: No acute finding. Other: None. IMPRESSION: 1. Redemonstrated mixed attenuation left hemispheric subdural hematoma, which is unchanged in maximum thickness at 1.5 cm, however decreased in overall volume about the left hemisphere following craniotomy. 2. There is however new hyperdense, acute appearing subdural blood product overlying the posterior left frontal lobe and parietal lobe, consistent with recurrent hemorrhage. 3. Severe left right midline shift is not significantly changed at 1.4 cm. These results were called by telephone at the time of interpretation on  07/10/2021 at 3:03 pm to Dr. Gareth Morgan , who verbally acknowledged these results. Electronically Signed   By: Delanna Ahmadi M.D.   On: 07/10/2021 15:09   DG Chest Portable 1 View  Result Date: 07/10/2021 CLINICAL DATA:  Weakness EXAM: PORTABLE CHEST 1 VIEW COMPARISON:  Chest x-ray 11/20/2020 FINDINGS: Heart is enlarged. Mediastinum appears stable. Calcified plaques in the aortic arch. Aortic valve prosthesis. Pulmonary vasculature is normal. No focal consolidation identified. No pleural effusion or pneumothorax. IMPRESSION: Cardiomegaly with no acute process identified. Electronically Signed   By: Ofilia Neas M.D.   On: 07/10/2021 16:41   DG Swallowing Func-Speech Pathology  Result Date: 07/13/2021 Table formatting from the original result was not included. Objective Swallowing Evaluation: Type of Study: MBS-Modified Barium Swallow Study  Patient Details Name: Denise Page MRN: [id number] Date of Birth: 10-28-1940 Today's Date: 07/13/2021 Time: SLP Start Time (ACUTE ONLY): 1400 -SLP Stop Time (ACUTE ONLY): 1424 SLP Time Calculation (min) (ACUTE ONLY): 24 min Past Medical History: Past Medical History: Diagnosis Date  Anemia   Aortic stenosis   Bilateral carotid artery disease ([place])    Enlarged thyroid   Hyperlipidemia   Hypertension   NSTEMI (non-ST elevated myocardial infarction) [facility])  Past Surgical History: Past Surgical History: Procedure Laterality Date  BIOPSY  08/01/2020  Procedure: BIOPSY;  Surgeon: Ronnette Juniper, MD;  Location: [facility] ENDOSCOPY;  Service: Gastroenterology;;  EGD and COLON  CHOLECYSTECTOMY    COLONOSCOPY N/A 08/01/2020  Procedure: COLONOSCOPY;  Surgeon: Ronnette Juniper, MD;  Location: [facility] ENDOSCOPY;  Service: Gastroenterology;  Laterality: N/A;  CYSTECTOMY    ESOPHAGOGASTRODUODENOSCOPY (EGD) WITH PROPOFOL N/A 08/01/2020  Procedure: ESOPHAGOGASTRODUODENOSCOPY (EGD) WITH PROPOFOL;  Surgeon: Ronnette Juniper, MD;  Location: [facility]COPY;  Service: Gastroenterology;  Laterality: N/A;  POLYPECTOMY  08/01/2020  Procedure: POLYPECTOMY;  Surgeon: Ronnette Juniper, MD;  Location: [facility]OS[facility];  Service: Gastroenterology;;  TEE WITHOUT CARDIOVERSION N/A 11/13/2020  Procedure: TRANSESOPHAGEAL ECHOCARDIOGRAM (TEE);  Surgeon: Josue Hector, MD;  Location: [facility] ENDOSCOPY;  Service: Cardiovascular;  Laterality: N/A;  TONSILLECTOMY   HPI: 81 y.o. female admitted from hospice facility on 2/17 after demonstrating some recovery and improved MS. She has a complicated PMHx - admitted to [facility] 06/25/21 after fall, sustaining L SDH and right mandibular fx; she was discharged home with 24/7 support.  Had another fall at home, readmitted to [facility], , underwent craniotomy for evacuation; demonstrated worsening status and repeat crani.  Ultimately she was placed in hospice care with a DNR/ DNI on 2/14, demonstrating subsequent improvements with admission to [facility]. 2/19 dx'd with shingles and transferred to [facility].  Subjective: alert, talking more  Recommendations for follow up therapy are one component of a multi-disciplinary discharge planning process, led by the attending physician.  Recommendations may be updated based on patient status, additional functional criteria and insurance authorization. Assessment / Plan /  Recommendation Clinical Impressions 07/13/2021 Clinical Impression Pt presents with a very mild dysphagia marked by oral delays and mild, intermittent retention in right lateral sulcus and occasional anterior spillage. There was high penetration of thin liquids (PAS2) that was ejected from the larynx upon completion of the swallow, considered WFL. There was no aspiration.  Pharyngeal stripping wave was normal, leaving no residue post swallow. Recommend starting a dysphagia 3 diet with thin liquids; meds whole in liquid or puree, per preference.  D/W RN and pt's daughter at bedside after the study and swallow sign posted over [facility]. SLP Visit Diagnosis Dysphagia, oropharyngeal phase (R13.12) Attention and concentration deficit following -- Frontal  lobe and executive function deficit following -- Impact on safety and function Mild aspiration risk   Treatment Recommendations 07/13/2021 Treatment Recommendations Therapy as outlined in treatment plan below   Prognosis 07/13/2021 Prognosis for Safe Diet Advancement Good Barriers to Reach Goals -- Barriers/Prognosis Comment -- Diet Recommendations 07/13/2021 SLP Diet Recommendations Dysphagia 3 (Mech soft) solids;Thin liquid Liquid Administration via Straw Medication Administration Whole meds with liquid Compensations Minimize environmental distractions Postural Changes --   Other Recommendations 07/13/2021 Recommended Consults -- Oral Care Recommendations Oral care BID Other Recommendations -- Follow Up Recommendations Skilled nursing-short term rehab (<3 hours/day) Assistance recommended at discharge Frequent or constant Supervision/Assistance Functional Status Assessment Patient has had a recent decline in their functional status and demonstrates the ability to make significant improvements in function in a reasonable and predictable amount of time. Frequency and Duration  07/13/2021 Speech Therapy Frequency (ACUTE ONLY) min 2x/week Treatment Duration 2 weeks   Oral Phase  07/13/2021 Oral Phase Impaired Oral - Pudding Teaspoon -- Oral - Pudding Cup -- Oral - Honey Teaspoon -- Oral - Honey Cup -- Oral - Nectar Teaspoon -- Oral - Nectar Cup -- Oral - Nectar Straw -- Oral - Thin Teaspoon -- Oral - Thin Cup -- Oral - Thin Straw -- Oral - Puree -- Oral - Mech Soft -- Oral - Regular Delayed oral transit Oral - Multi-Consistency -- Oral - Pill -- Oral Phase - Comment --  Pharyngeal Phase 07/13/2021 Pharyngeal Phase Impaired Pharyngeal- Pudding Teaspoon -- Pharyngeal -- Pharyngeal- Pudding Cup -- Pharyngeal -- Pharyngeal- Honey Teaspoon -- Pharyngeal -- Pharyngeal- Honey Cup -- Pharyngeal -- Pharyngeal- Nectar Teaspoon -- Pharyngeal -- Pharyngeal- Nectar Cup -- Pharyngeal -- Pharyngeal- Nectar Straw -- Pharyngeal -- Pharyngeal- Thin Teaspoon -- Pharyngeal -- Pharyngeal- Thin Cup Penetration/Aspiration before swallow Pharyngeal Material enters airway, remains ABOVE vocal cords then ejected out Pharyngeal- Thin Straw -- Pharyngeal -- Pharyngeal- Puree -- Pharyngeal -- Pharyngeal- Mechanical Soft -- Pharyngeal -- Pharyngeal- Regular -- Pharyngeal -- Pharyngeal- Multi-consistency -- Pharyngeal -- Pharyngeal- Pill -- Pharyngeal -- Pharyngeal Comment --  No flowsheet data found. Juan Quam Laurice 07/13/2021, 2:35 PM                       Discharge Instructions: Discharge Instructions     Call MD for:  difficulty breathing, headache or visual disturbances   Complete by: As directed    Call MD for:  extreme fatigue   Complete by: As directed    Call MD for:  hives   Complete by: As directed    Call MD for:  persistant dizziness or light-headedness   Complete by: As directed    Call MD for:  persistant nausea and vomiting   Complete by: As directed    Call MD for:  redness, tenderness, or signs of infection (pain, swelling, redness, odor or green/yellow discharge around incision site)   Complete by: As directed    Call MD for:  severe uncontrolled pain   Complete by: As directed     Call MD for:  temperature >100.4   Complete by: As directed    Diet - low sodium heart healthy   Complete by: As directed    Discharge wound care:   Complete by: As directed    Keep wound covered at all times.  Change dressing once a day. Sponge bathes until wounds crust over.   Increase activity slowly   Complete by: As directed        Signed: Jeanice Lim,  Enid Derry, MD 07/17/2021, 1:41 PM   Pager: [phone]

## 2021-07-17 NOTE — Plan of Care (Signed)

## 2021-07-17 NOTE — Progress Notes (Incomplete)
Inpatient Rehabilitation Admission Medication Review by a Pharmacist  A complete drug regimen review was completed for this patient to identify any potential clinically significant medication issues.  High Risk Drug Classes Is patient taking? Indication by Medication  Antipsychotic Yes Compazine- N/V  Anticoagulant No   Antibiotic No   Opioid No   Antiplatelet No   Hypoglycemics/insulin No   Vasoactive Medication Yes Coreg- hypertension  Chemotherapy No   Other Yes Trazodone- sleep Buspar, lexapro- anxiety Zostrix- herpes zoster X8 more doses (end date: 07/20/2021) Synthroid- hypothyroidism Protonix- GERD     Type of Medication Issue Identified Description of Issue Recommendation(s)  Drug Interaction(s) (clinically significant)     Duplicate Therapy     Allergy     No Medication Administration End Date     Incorrect Dose     Additional Drug Therapy Needed     Significant med changes from prior encounter (inform family/care partners about these prior to discharge).    Other  PTA meds Xanax Miacalcin Ferrous sulfate Folic acid Rosuvastatin Carafate Vitamin B-12 Restart PTA meds when and if clinically necessary during CIR admission or at time of discharge.  As info- Miacalcin nasal is being held 2/2 nose bleeds    Clinically significant medication issues were identified that warrant physician communication and completion of prescribed/recommended actions by midnight of the next day:  No   Time spent performing this drug regimen review (minutes): 30   Carah Barrientes BS, PharmD, BCPS Clinical Pharmacist 07/17/2021 1:28 PM

## 2021-07-17 NOTE — H&P (Signed)
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 She was d/c to Hospice on 07/08/21 but after a few days in hospice, she was noted to have awakening and started interacting with family therefore family wanted to resume full care with tube feed for nutritional support. 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   Dr. Venetia Con[place] consulted for input and felt that  improvement in mentation likely due to [place] which as d/c at discharge as CT head showed no change in SDH.   He recommended endovascular MMA embolization to stop further hemorrhage if patient continued to improve after Keppra metabolites washed out but felt to have poor overall long term prognosis. Mentation has been improving with increase in verbal output and she was started on D3, thins by 02/20 due to marked oral delay and mild intermittent right pocketing.  She has had issues with confusion and agitation--has pulled out her foley as well as anxiety at nights affecting sleep. MMA embolization deferred due to anesthesia risks and concerns of not being liberated from vent.   She developed blister due to herpes zoster outbreak on right buttock 02/19 and was started on Valtrex. Therapy has been working with patient who is showing improvement in activity, able to follow commands with multimodal cues as well as  cognitive linguistic ability. She continues to be limited by weakness, balance deficits with scissoring and RLE weaknes. CIR recommended due to functional decline. Daughter notes worsening delirium today.   Review of Systems  Unable to perform ROS: Mental acuity  Gastrointestinal:  Positive for abdominal pain.  Neurological:  Positive for headaches.    Past Medical History:  Diagnosis Date   Anemia    Aortic stenosis 03/2021   s/p TVAR   Bilateral carotid artery disease (HCC)    Enlarged thyroid    Gastric ulcer    Hyperlipidemia    Hypertension    Mitral stenosis    NSTEMI (non-ST elevated myocardial infarction) [facility])     Past Surgical History:  Procedure Laterality Date   BIOPSY  08/01/2020   Procedure: BIOPSY;  Surgeon: Ronnette Juniper, MD;  Location: [facility];  Service: Gastroenterology;;  EGD and COLON   CHOLECYSTECTOMY     COLONOSCOPY N/A 08/01/2020   Procedure: COLONOSCOPY;  Surgeon: Ronnette Juniper, MD;  Location: [facility];  Service: Gastroenterology;  Laterality: N/A;   CYSTECTOMY     ESOPHAGOGASTRODUODENOSCOPY (EGD) WITH PROPOFOL N/A 08/01/2020   Procedure: ESOPHAGOGASTRODUODENOSCOPY (EGD) WITH PROPOFOL;  Surgeon: Ronnette Juniper, MD;  Location: [facility];  Service: Gastroenterology;  Laterality: N/A;   POLYPECTOMY  08/01/2020   Procedure: POLYPECTOMY;  Surgeon: Ronnette Juniper, MD;  Location: [facility];  Service: Gastroenterology;;   TEE WITHOUT CARDIOVERSION N/A 11/13/2020   Procedure: TRANSESOPHAGEAL ECHOCARDIOGRAM (TEE);  Surgeon: Josue Hector, MD;  Location: [place];  Service: Cardiovascular;  Laterality:  N/A;   TONSILLECTOMY      Family History  Problem Relation Age of Onset   Throat cancer Sister    Prostate cancer Brother     Social History: Plans for discharge with family assisting. Per  reports that she has never smoked. She has never used smokeless tobacco. No history on file for alcohol use and drug use.   Allergies  Allergen Reactions    Morphine And Related Anaphylaxis   Sulfa Antibiotics Anaphylaxis   Codeine Nausea And Vomiting   Tramadol Nausea And Vomiting   Medications Prior to Admission  Medication Sig Dispense Refill   acetaminophen (TYLENOL) 650 MG CR tablet Take 650 mg by mouth every 8 (eight) hours as needed for pain.     busPIRone (BUSPAR) 5 MG tablet Take 1 tablet (5 mg total) by mouth 2 (two) times daily. 60 tablet 0   capsaicin (ZOSTRIX) 0.025 % cream Apply topically 2 (two) times daily. 60 g 0   carvedilol (COREG) 6.25 MG tablet Take 1 tablet (6.25 mg total) by mouth 2 (two) times daily with a meal. 180 tablet 1   escitalopram (LEXAPRO) 5 MG tablet Take 5 mg by mouth at bedtime.     feeding supplement (ENSURE ENLIVE / ENSURE PLUS) LIQD Take 237 mLs by mouth 3 (three) times daily between meals. 237 mL 12   ferrous sulfate 325 (65 FE) MG tablet Take 325 mg by mouth in the morning and at bedtime.     folic acid (FOLVITE) 1 MG tablet Take 1 mg by mouth in the morning.     levothyroxine (SYNTHROID) 50 MCG tablet Take 50 mcg by mouth daily before breakfast.     [START ON 07/18/2021] Multiple Vitamin (MULTIVITAMIN WITH MINERALS) TABS tablet Take 1 tablet by mouth daily. 30 tablet 0   [START ON 07/18/2021] pantoprazole (PROTONIX) 40 MG tablet Take 1 tablet (40 mg total) by mouth daily. 30 tablet 0   rosuvastatin (CRESTOR) 5 MG tablet TAKE 1 TABLET BY MOUTH EVERY DAY (Patient taking differently: Take 5 mg by mouth daily.) 90 tablet 1   sucralfate (CARAFATE) 1 g tablet Take 1 g by mouth 2 (two) times daily.     valACYclovir (VALTREX) 1000 MG tablet Take 1 tablet (1,000 mg total) by mouth 3 (three) times daily for 3 days. 9 tablet 0   vitamin B-12 (CYANOCOBALAMIN) 1000 MCG tablet Take 1,000 mcg by mouth daily.      Home: Home Living Family/patient expects to be discharged to:: Private residence Living Arrangements: Children Available Help at Discharge: Family Type of Home: House Home Access: Stairs to enter [place]: One level Bathroom Shower/Tub: Chiropodist: Standard Bathroom Accessibility: Yes Additional Comments: daughter at bedside and reports the plan is for her to reutrn home to family. Stays with Son.   Functional History: Prior Function Prior Level of Function : Needs assist Mobility Comments: pt ambulated without assistive device, fell going to mailbox which resulted in initial SDH ADLs Comments: reports that patient was able to stand pivot to bathroom surfaces prior to admission   Functional Status:  Mobility: Bed Mobility Overal bed mobility: Needs Assistance Bed Mobility: Supine to Sit, Sit to Supine Rolling: Max assist Supine to sit: Supervision Sit to supine: Min guard General bed mobility comments: Pt progressing to right side of bed with no physical assist. Cues for scooting forward to edge of bed Transfers Overall transfer level: Needs assistance Equipment used: Rolling walker (2 wheels) Transfers: Sit to/from Stand, Bed  to chair/wheelchair/BSC Sit to Stand: +2 safety/equipment Bed to/from chair/wheelchair/BSC transfer type:: Stand pivot Stand pivot transfers: Min assist, +2 physical assistance General transfer comment: MinA + 2 to rise from bed and pivot to [facility], cues for reaching back to control eccentric descent Ambulation/Gait Ambulation/Gait assistance: +2 safety/equipment, Min assist Gait Distance (Feet): 20 Feet (10 ft x 2) Assistive device: Rolling walker (2 wheels) Gait Pattern/deviations: Step-through pattern, Decreased stride length, Narrow base of support, Scissoring General Gait Details: Pt ambulating 10 ft x 2 with minA for balance (+2 for chair follow), with one seated rest break. Pt with intermittent scissoring, narrow BOS, and mild R knee buckle. Gait velocity: decreased Gait velocity interpretation: <1.8 ft/sec, indicate of risk for recurrent falls   ADL: ADL Overall ADL's : Needs assistance/impaired Grooming: Moderate  assistance, Sitting Toilet Transfer: +2 for physical assistance, Minimal assistance, Stand-pivot, BSC/3in1 Toileting- Clothing Manipulation and Hygiene: Total assistance, Sit to/from stand Toileting - Clothing Manipulation Details (indicate cue type and reason): pt initiates the stand and total (A) for the peri care. pt with stedy assistance from second therapist Functional mobility during ADLs: +2 for physical assistance, Minimal assistance, Rolling walker (2 wheels) General ADL Comments: pt sits quickly without much cues when fatigued   Cognition: Cognition Overall Cognitive Status: Impaired/Different from baseline Arousal/Alertness: Awake/alert (aphasia prevents thorough assessment) Orientation Level: Oriented to person, Disoriented to situation, Disoriented to time, Disoriented to place Cognition Arousal/Alertness: Awake/alert Behavior During Therapy: Flat affect Overall Cognitive Status: Impaired/Different from baseline Area of Impairment: Attention, Memory, Following commands, Safety/judgement, Awareness, Problem solving Current Attention Level: Sustained Following Commands: Follows one step commands inconsistently Safety/Judgement: Decreased awareness of safety, Decreased awareness of deficits Awareness: Intellectual Problem Solving: Slow processing, Decreased initiation, Requires verbal cues, Requires tactile cues, Difficulty sequencing General Comments: Benefits from one step, simple motor cues. very pleasant, smiling, decreased verbalizations 2/2 aphasia. pt responds well to counting to help cue     There were no vitals taken for this visit. Physical Exam Constitutional:      Comments: NAD. Resolving ecchymosis  left cheek   HENT:     Head:     Comments: Left crani incision with staples and sutures intact Cardiac: tachycardic Pulmonary: breathing comfortably Abdomen: NT, ND Neurological/MSK    Mental Status: She is alert.     Comments: Oriented to self, able to state  age and DOB, son and daughter's name but had limited output with language of confusion. Left inattention. Right pronator drift and had difficulty following 2 step commands due to mental status Skin: staples on crani site c/d/i  Results for orders placed or performed during the hospital encounter of 07/10/21 (from the past 48 hour(s))  CBC with Differential/Platelet     Status: Abnormal   Collection Time: 07/16/21 11:44 AM  Result Value Ref Range   WBC 6.2 4.0 - 10.5 K/uL   RBC 3.06 (L) 3.87 - 5.11 MIL/uL   Hemoglobin 8.6 (L) 12.0 - 15.0 g/dL   HCT 26.5 (L) 36.0 - 46.0 %   MCV 86.6 80.0 - 100.0 fL   MCH 28.1 26.0 - 34.0 pg   MCHC 32.5 30.0 - 36.0 g/dL   RDW 14.7 11.5 - 15.5 %   Platelets 212 150 - 400 K/uL   nRBC 0.0 0.0 - 0.2 %   Neutrophils Relative % 60 %   Neutro Abs 3.7 1.7 - 7.7 K/uL   Lymphocytes Relative 21 %   Lymphs Abs 1.3 0.7 - 4.0 K/uL   Monocytes Relative 14 %  Monocytes Absolute 0.9 0.1 - 1.0 K/uL   Eosinophils Relative 1 %   Eosinophils Absolute 0.0 0.0 - 0.5 K/uL   Basophils Relative 0 %   Basophils Absolute 0.0 0.0 - 0.1 K/uL   Immature Granulocytes 4 %   Abs Immature Granulocytes 0.27 (H) 0.00 - 0.07 K/uL    Comment: Performed at [facility] [address] [place], [place] [address]  Basic metabolic panel     Status: Abnormal   Collection Time: 07/16/21 11:44 AM  Result Value Ref Range   Sodium 139 135 - 145 mmol/L   Potassium 3.9 3.5 - 5.1 mmol/L   Chloride 106 98 - 111 mmol/L   CO2 22 22 - 32 mmol/L   Glucose, Bld 130 (H) 70 - 99 mg/dL    Comment: Glucose reference range applies only to samples taken after fasting for at least 8 hours.   BUN 12 8 - 23 mg/dL   Creatinine, Ser 0.90 0.44 - 1.00 mg/dL   Calcium 9.1 8.9 - 10.3 mg/dL   GFR, Estimated >60 >60 mL/min    Comment: (NOTE) Calculated using the CKD-EPI Creatinine Equation (2021)    Anion gap 11 5 - 15    Comment: Performed at [facility] [address] [place], [place]  [address]   No results found.    There were no vitals taken for this visit.  Medical Problem List and Plan: 1. Functional deficits secondary to subdural hematoma s/p crani.   -patient may shower  -ELOS/Goals: S 10-14 days  -Admit to CIR 2.  Antithrombotics: -DVT/anticoagulation:  Mechanical: Sequential compression devices, below knee Bilateral lower extremities  -antiplatelet therapy: N/A 3. Pain Management: Tylenol prn.  4. Mood: LCSW to follow for evaluation and support when appropriate.   -antipsychotic agents: N/A 5. Neuropsych: This patient is not fully capable of making decisions on her own behalf. 6. Skin/Wound Care: Routine pressure relief measures.  7. Fluids/Electrolytes/Nutrition: Monitor I/O. Check lytes in am. 8. HTN: magnesium reviewed and stable. Add magnesium gluconate 250mg  HS to assist blood pressure control- systolic elevated 9. Chronic diastolic CHF/TVAR 09/32: on Coreg bid.  Check weights daily.  --Monitor for signs of overload.  --add low salt restrictions.  10. Herpes Zoster: Continue Valtrex. Continue airborne precautions. 11. H/o depression/anxiety: Continue Lexapro and buspar to manage symptoms. --assess sleep wake cycle.   12. H/o PUD/acute on chronic anemia: Will recheck CBC in am.  13. Impaired fasting glucose: will continue to monitor FBS with routine labs.  14. Delirium: continue safety measures with tele monitor. Discussed benefits of telesitter with daugghter.  15.  Abdominal pain: Urinary retention? Will order bladder scans/check PVRs. Add magnesium gluconate 250mg  HS to keep bowels moving regularly.   I have personally performed a face to face diagnostic evaluation, including, but not limited to relevant history and physical exam findings, of this patient and developed relevant assessment and plan.  Additionally, I have reviewed and concur with the physician assistant's documentation above.  Bary Leriche, PA-C 07/17/2021   Izora Ribas,  MD 07/17/2021

## 2021-07-17 NOTE — Progress Notes (Signed)
Inpatient Rehab Admissions Coordinator:  There is a bed available in CIR for pt to admit today. Dr. Johnney Ou is aware and in agreement. Pt, [name] (pt's daughter), TOC, and NSG made aware.     Gayland Curry, [place], [place] Admissions Coordinator [phone]

## 2021-07-17 NOTE — Progress Notes (Signed)
Physical Therapy Treatment Patient Details Name: Denise Page MRN: [id number] DOB: [birth year] Today's Date: 07/17/2021   History of Present Illness 81 y.o. female presents to [facility] hospital on 07/10/2021 from hospice after recent SDH with multiple evacuations and recurrences while admitted at outside hospital. Pt with neurological improvement at hospice once taken off keppra. CTH demonstrates mixed density L SDH with some hyperdense material, likely acute on chronic hemorrhage, stable MLS from prior [place] at OSH. PMH includes gastric ulcers, HLD, HTN, facial fxs, AVR.    PT Comments    Pt very pleasant and trying to verbalize/communicate more today. Pt perseverating on her grandson's name, saying "he is my heart". Attempted to have pt write to try to communicate her needs better, but pt wrote "Querdy" 3x, presumably attempting to write her grandson's name, [name]. Pt reported being fatigued, which family confirmed she had not slept much last night, thus pt declined to work on advancing gait. However, pt agreeable to exercises and x2 standing bouts. Will continue to follow acutely. Current recommendations remain appropriate.     Recommendations for follow up therapy are one component of a multi-disciplinary discharge planning process, led by the attending physician.  Recommendations may be updated based on patient status, additional functional criteria and insurance authorization.  Follow Up Recommendations  Acute inpatient rehab (3hours/day)     Assistance Recommended at Discharge Frequent or constant Supervision/Assistance  Patient can return home with the following Assistance with cooking/housework;Direct supervision/assist for medications management;Direct supervision/assist for financial management;Assist for transportation;Help with stairs or ramp for entrance;A little help with walking and/or transfers;A little help with bathing/dressing/bathroom   Equipment Recommendations  Rolling walker  (2 wheels);BSC/3in1;Wheelchair (measurements PT);Wheelchair cushion (measurements PT)    Recommendations for Other Services       Precautions / Restrictions Precautions Precautions: Fall;Other (comment) Precaution Comments: aphasic Restrictions Weight Bearing Restrictions: No     Mobility  Bed Mobility Overal bed mobility: Needs Assistance Bed Mobility: Supine to Sit, Sit to Supine     Supine to sit: Supervision, HOB elevated Sit to supine: HOB elevated, Supervision   General bed mobility comments: Pt progressing to right side of bed with no physical assist. Cues for scooting forward to edge of bed. Able to return to supine without physical assist. ModA to scoot superiorly in bed needing tactile cues to achieve bridged position    Transfers Overall transfer level: Needs assistance Equipment used: Rolling walker (2 wheels) Transfers: Sit to/from Stand Sit to Stand: Min assist, Mod assist           General transfer comment: Pt coming to stand 2x from EOB, needing tactile cues to shift weight to scoot to EOB and hand-over-hand for UE placement to push up to RW. Pt varying from needing min-modA to power up to stand and steady, cuing for hand placement on RW    Ambulation/Gait Ambulation/Gait assistance: Min assist Gait Distance (Feet): 3 Feet Assistive device: Rolling walker (2 wheels) Gait Pattern/deviations: Step-through pattern, Decreased stride length, Narrow base of support Gait velocity: decreased Gait velocity interpretation: <1.31 ft/sec, indicative of household ambulator   General Gait Details: Pt taking a few side steps to R at EOB, declining further mobility due to fatigue. Family reports she did not sleep last night. Pt with intermittent knee buckle.   Stairs             Wheelchair Mobility    Modified Rankin (Stroke Patients Only) Modified Rankin (Stroke Patients Only) Pre-Morbid Rankin Score: Slight disability Modified Rankin: Moderately severe  disability     Balance Overall balance assessment: Needs assistance Sitting-balance support: Feet supported Sitting balance-Leahy Scale: Fair Sitting balance - Comments: x1 LOB to L but caught herself   Standing balance support: During functional activity, Bilateral upper extremity supported, Reliant on assistive device for balance Standing balance-Leahy Scale: Poor Standing balance comment: reliant on BUE support of RW                            Cognition Arousal/Alertness: Awake/alert Behavior During Therapy: WFL for tasks assessed/performed Overall Cognitive Status: Impaired/Different from baseline Area of Impairment: Attention, Memory, Following commands, Safety/judgement, Awareness, Problem solving                   Current Attention Level: Sustained Memory: Decreased short-term memory Following Commands: Follows one step commands inconsistently, Follows one step commands with increased time Safety/Judgement: Decreased awareness of safety, Decreased awareness of deficits Awareness: Intellectual Problem Solving: Slow processing, Decreased initiation, Requires verbal cues, Requires tactile cues, Difficulty sequencing General Comments: Benefits from one step, simple tactile and visual cues. very pleasant, smiling, decreased verbalizations 2/2 aphasia. Perseverated on her grandson, [name], writing "Quendy" 3x on paper.        Exercises General Exercises - Lower Extremity Straight Leg Raises: AROM, Strengthening, Left, 5 reps, Supine Hip Flexion/Marching: AROM, Strengthening, Both, 5 reps, Seated, Other reps (comment), Standing (x5 seated, x1-2 standing but knee buckle thus pt decided to sit) Other Exercises Other Exercises: x3 bridges supine in bed with assistance    General Comments        Pertinent Vitals/Pain Pain Assessment Pain Assessment: Faces Faces Pain Scale: No hurt Pain Intervention(s): Monitored during session    Home Living                           Prior Function            PT Goals (current goals can now be found in the care plan section) Acute Rehab PT Goals Patient Stated Goal: to get better PT Goal Formulation: With patient/family Time For Goal Achievement: 07/25/21 Potential to Achieve Goals: Fair Progress towards PT goals: Progressing toward goals    Frequency    Min 3X/week      PT Plan Current plan remains appropriate    Co-evaluation              AM-PAC PT "6 Clicks" Mobility   Outcome Measure  Help needed turning from your back to your side while in a flat bed without using bedrails?: A Little Help needed moving from lying on your back to sitting on the side of a flat bed without using bedrails?: A Little Help needed moving to and from a bed to a chair (including a wheelchair)?: A Lot Help needed standing up from a chair using your arms (e.g., wheelchair or bedside chair)?: A Lot Help needed to walk in hospital room?: A Lot Help needed climbing 3-5 steps with a railing? : Total 6 Click Score: 13    End of Session   Activity Tolerance: Patient tolerated treatment well Patient left: with call bell/phone within reach;with family/visitor present;in bed;with bed alarm set   PT Visit Diagnosis: Other abnormalities of gait and mobility (R26.89);Muscle weakness (generalized) (M62.81);Other symptoms and signs involving the nervous system (R29.898);Unsteadiness on feet (R26.81);Difficulty in walking, not elsewhere classified (R26.2)     Time: 1245-8099 PT Time Calculation (min) (ACUTE ONLY): 27 min  Charges:  $Therapeutic Exercise: 8-22 mins $Therapeutic Activity: 8-22 mins                     Moishe Spice, PT, DPT Acute Rehabilitation Services  Pager: [phone] Office: [place] 07/17/2021, 2:18 PM

## 2021-07-17 NOTE — Progress Notes (Signed)
Inpatient Rehabilitation Admission Medication Review by a Pharmacist  A complete drug regimen review was completed for this patient to identify any potential clinically significant medication issues.  High Risk Drug Classes Is patient taking? Indication by Medication  Antipsychotic Yes Compazine- N/V  Anticoagulant No   Antibiotic No   Opioid No   Antiplatelet No   Hypoglycemics/insulin No   Vasoactive Medication Yes Coreg- hypertension  Chemotherapy No   Other Yes Trazodone- sleep Buspar, lexapro- anxiety Zostrix- herpes zoster X8 more doses (end date: 07/20/2021) Synthroid- hypothyroidism Protonix- GERD     Type of Medication Issue Identified Description of Issue Recommendation(s)  Drug Interaction(s) (clinically significant)     Duplicate Therapy     Allergy     No Medication Administration End Date     Incorrect Dose     Additional Drug Therapy Needed     Significant med changes from prior encounter (inform family/care partners about these prior to discharge).    Other  PTA meds Xanax Miacalcin Ferrous sulfate Folic acid Rosuvastatin Carafate Vitamin B-12 Restart PTA meds when and if clinically necessary during CIR admission or at time of discharge.  As info- Miacalcin nasal is being held 2/2 nose bleeds    Clinically significant medication issues were identified that warrant physician communication and completion of prescribed/recommended actions by midnight of the next day:  No   Time spent performing this drug regimen review (minutes): 30   Earle Reome BS, PharmD, BCPS Clinical Pharmacist 07/17/2021 4:28 PM  Lorenso Courier, PharmD Clinical Pharmacist 07/17/2021 4:33 PM

## 2021-07-17 NOTE — Progress Notes (Signed)
Arrived to the unit free of complications.  Oriented to unit and assigned room. Admission/Assessments complete.   Yehuda Mao, LPN

## 2021-07-18 DIAGNOSIS — I10 Essential (primary) hypertension: Secondary | ICD-10-CM

## 2021-07-18 DIAGNOSIS — I69391 Dysphagia following cerebral infarction: Secondary | ICD-10-CM

## 2021-07-18 DIAGNOSIS — I5032 Chronic diastolic (congestive) heart failure: Secondary | ICD-10-CM

## 2021-07-18 DIAGNOSIS — S065XAA Traumatic subdural hemorrhage with loss of consciousness status unknown, initial encounter: Secondary | ICD-10-CM

## 2021-07-18 DIAGNOSIS — I69354 Hemiplegia and hemiparesis following cerebral infarction affecting left non-dominant side: Secondary | ICD-10-CM

## 2021-07-18 DIAGNOSIS — E876 Hypokalemia: Secondary | ICD-10-CM

## 2021-07-18 LAB — CBC WITH DIFFERENTIAL/PLATELET
Abs Immature Granulocytes: 0.15 10*3/uL — ABNORMAL HIGH (ref 0.00–0.07)
Basophils Absolute: 0 10*3/uL (ref 0.0–0.1)
Basophils Relative: 0 %
Eosinophils Absolute: 0 10*3/uL (ref 0.0–0.5)
Eosinophils Relative: 1 %
HCT: 25 % — ABNORMAL LOW (ref 36.0–46.0)
Hemoglobin: 7.9 g/dL — ABNORMAL LOW (ref 12.0–15.0)
Immature Granulocytes: 4 %
Lymphocytes Relative: 31 %
Lymphs Abs: 1.3 10*3/uL (ref 0.7–4.0)
MCH: 27.2 pg (ref 26.0–34.0)
MCHC: 31.6 g/dL (ref 30.0–36.0)
MCV: 86.2 fL (ref 80.0–100.0)
Monocytes Absolute: 0.7 10*3/uL (ref 0.1–1.0)
Monocytes Relative: 17 %
Neutro Abs: 1.9 10*3/uL (ref 1.7–7.7)
Neutrophils Relative %: 47 %
Platelets: 202 10*3/uL (ref 150–400)
RBC: 2.9 MIL/uL — ABNORMAL LOW (ref 3.87–5.11)
RDW: 15 % (ref 11.5–15.5)
WBC: 4.1 10*3/uL (ref 4.0–10.5)
nRBC: 0.5 % — ABNORMAL HIGH (ref 0.0–0.2)

## 2021-07-18 LAB — COMPREHENSIVE METABOLIC PANEL
ALT: 12 U/L (ref 0–44)
AST: 21 U/L (ref 15–41)
Albumin: 3.3 g/dL — ABNORMAL LOW (ref 3.5–5.0)
Alkaline Phosphatase: 75 U/L (ref 38–126)
Anion gap: 11 (ref 5–15)
BUN: 15 mg/dL (ref 8–23)
CO2: 24 mmol/L (ref 22–32)
Calcium: 8.8 mg/dL — ABNORMAL LOW (ref 8.9–10.3)
Chloride: 102 mmol/L (ref 98–111)
Creatinine, Ser: 0.93 mg/dL (ref 0.44–1.00)
GFR, Estimated: >60 mL/min (ref >60)
Glucose, Bld: 108 mg/dL — ABNORMAL HIGH (ref 70–99)
Potassium: 4 mmol/L (ref 3.5–5.1)
Sodium: 137 mmol/L (ref 135–145)
Total Bilirubin: 0.6 mg/dL (ref 0.3–1.2)
Total Protein: 6.2 g/dL — ABNORMAL LOW (ref 6.5–8.1)

## 2021-07-18 NOTE — Progress Notes (Signed)
[place] PHYSICAL MEDICINE & REHABILITATION PROGRESS NOTE  Subjective/Complaints: Patient seen pending with therapies.  She states she slept well overnight.  Discussed that patient is doing well with therapies and patient.  ROS: Denies CP, SOB, N/V/D  Objective: Vital Signs: Blood pressure (!) 142/68, pulse 83, temperature 97.8 F (36.6 C), resp. rate 19, height 5\' 8"  (1.727 m), weight 72.9 kg, SpO2 97 %. No results found. Recent Labs    07/16/21 1144 07/18/21 0532  WBC 6.2 4.1  HGB 8.6* 7.9*  HCT 26.5* 25.0*  PLT 212 202   Recent Labs    07/16/21 1144 07/18/21 0532  NA 139 137  K 3.9 4.0  CL 106 102  CO2 22 24  GLUCOSE 130* 108*  BUN 12 15  CREATININE 0.90 0.93  CALCIUM 9.1 8.8*    Intake/Output Summary (Last 24 hours) at 07/18/2021 1515 Last data filed at 07/18/2021 1300 Gross per 24 hour  Intake 560 ml  Output 0 ml  Net 560 ml        Physical Exam: BP (!) 142/68 (BP Location: Left Arm)    Pulse 83    Temp 97.8 F (36.6 C)    Resp 19    Ht 5\' 8"  (1.727 m)    Wt 72.9 kg    SpO2 97%    BMI 24.44 kg/m  Constitutional: No distress . Vital signs reviewed. HENT: Left Crani site Eyes: EOMI. No discharge. Cardiovascular: No JVD.  RRR. Respiratory: Normal effort.  No stridor.  Bilateral clear to auscultation. GI: Non-distended.  BS +. Skin: Warm and dry.  Intact. Psych: Normal mood.  Normal behavior. Musc: No edema in extremities.  No tenderness in extremities. Neuro: Alert  Motor: LUE/LE: 4 -/5 with apraxia Dysarthria  Assessment/Plan: 1. Functional deficits which require 3+ hours per day of interdisciplinary therapy in a comprehensive inpatient rehab setting. Physiatrist is providing close team supervision and 24 hour management of active medical problems listed below. Physiatrist and rehab team continue to assess barriers to discharge/monitor patient progress toward functional and medical goals   Care Tool:  Bathing    Body parts bathed by patient:  Right arm, Left arm, Chest, Abdomen, Right upper leg, Left upper leg, Face   Body parts bathed by helper: Front perineal area, Buttocks, Right lower leg, Left lower leg     Bathing assist Assist Level: Moderate Assistance - Patient 50 - 74%     Upper Body Dressing/Undressing Upper body dressing   What is the patient wearing?: Bra, Pull over shirt    Upper body assist Assist Level: Moderate Assistance - Patient 50 - 74%    Lower Body Dressing/Undressing Lower body dressing      What is the patient wearing?: Pants, Incontinence brief     Lower body assist Assist for lower body dressing: Maximal Assistance - Patient 25 - 49%     Toileting Toileting    Toileting assist Assist for toileting: Maximal Assistance - Patient 25 - 49%     Transfers Chair/bed transfer  Transfers assist     Chair/bed transfer assist level: Moderate Assistance - Patient 50 - 74%     Locomotion Ambulation   Ambulation assist              Walk 10 feet activity   Assist           Walk 50 feet activity   Assist           Walk 150 feet activity   Assist  Walk 10 feet on uneven surface  activity   Assist           Wheelchair     Assist               Wheelchair 50 feet with 2 turns activity    Assist            Wheelchair 150 feet activity     Assist           Medical Problem List and Plan: 1. 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   Continue CIR evaluations 2.  Antithrombotics: -DVT/anticoagulation:  Mechanical: Sequential compression devices, below knee Bilateral lower extremities             -antiplatelet therapy: N/A 3. Pain Management: Tylenol prn.  4. Mood: LCSW to follow for evaluation and support when appropriate.              -antipsychotic agents: N/A 5. Neuropsych: This patient is not fully capable of making decisions on her own behalf. 6. Skin/Wound  Care: Routine pressure relief measures.  7. Fluids/Electrolytes/Nutrition: Monitor I/OS 8. HTN: magnesium reviewed and stable. Add magnesium gluconate 250mg  HS to assist blood pressure control- systolic elevated  Slightly labile on 2/25, monitor for trend 9. Chronic diastolic CHF/TVAR 30/13: on Coreg bid.   --Monitor for signs of overload.             --add low salt restrictions.  Filed Weights   07/17/21 1653 07/17/21 1700  Weight: 72.9 kg 72.9 kg   10. Herpes Zoster: Continue Valtrex. Continue airborne precautions. 11. H/o depression/anxiety: Continue Lexapro and buspar to manage symptoms. --assess sleep wake cycle.   12. H/o PUD/acute on chronic anemia:   Hemoglobin 8.7 on 2/18, labs ordered for Monday 13. Impaired fasting glucose: will continue to monitor FBS with routine labs.  14. Delirium: continue safety measures with tele monitor. Discussed benefits of telesitter with daugghter.  15.  Abdominal pain: Urinary retention? Will order bladder scans/check PVRs. Add magnesium gluconate 250mg  HS to keep bowels moving regularly.  16.  Hypokalemia  Potassium 3.2 on 2/18, labs ordered for Monday 17.  Poststroke dysphagia  D3 thins, advance diet as tolerated     LOS: 1 days A FACE TO FACE EVALUATION WAS PERFORMED  Daxten Kovalenko Lorie Phenix 07/18/2021, 3:15 PM

## 2021-07-18 NOTE — Evaluation (Signed)
Physical Therapy Assessment and Plan  Patient Details  Name: Denise Page MRN: [id number] Date of Birth: 11-12-1940  PT Diagnosis: Ataxia, Coordination disorder, Difficulty walking, Dizziness and giddiness, and Hemiparesis dominant Rehab Potential: Good ELOS: 16-18 days   Today's Date: 07/18/2021 PT Individual Time: 1305-1406 PT Individual Time Calculation (min): 61 min   Hospital Problem: Principal Problem:   SDH (subdural hematoma) Active Problems:   Hypokalemia   Dysphagia, post-stroke   Chronic diastolic congestive heart failure ([place])   Essential hypertension   Hemiparesis affecting left side as late effect of stroke [facility])   Past Medical History:  Past Medical History:  Diagnosis Date   Anemia    Aortic stenosis 03/2021   s/p TVAR   Bilateral carotid artery disease ([place])    Enlarged thyroid    Gastric ulcer    Hyperlipidemia    Hypertension    Mitral stenosis    NSTEMI (non-ST elevated myocardial infarction) [facility])    Past Surgical History:  Past Surgical History:  Procedure Laterality Date   BIOPSY  08/01/2020   Procedure: BIOPSY;  Surgeon: Ronnette Juniper, MD;  Location: [facility];  Service: Gastroenterology;;  EGD and COLON   CHOLECYSTECTOMY     COLONOSCOPY N/A 08/01/2020   Procedure: COLONOSCOPY;  Surgeon: Ronnette Juniper, MD;  Location: [facility] ENDOSCOPY;  Service: Gastroenterology;  Laterality: N/A;   CYSTECTOMY     ESOPHAGOGASTRODUODENOSCOPY (EGD) WITH PROPOFOL N/A 08/01/2020   Procedure: ESOPHAGOGASTRODUODENOSCOPY (EGD) WITH PROPOFOL;  Surgeon: Ronnette Juniper, MD;  Location: [facility] ENDOSCOPY;  Service: Gastroenterology;  Laterality: N/A;   POLYPECTOMY  08/01/2020   Procedure: POLYPECTOMY;  Surgeon: Ronnette Juniper, MD;  Location: [facility] ENDOSCOPY;  Service: Gastroenterology;;   TEE WITHOUT CARDIOVERSION N/A 11/13/2020   Procedure: TRANSESOPHAGEAL ECHOCARDIOGRAM (TEE);  Surgeon: Josue Hector, MD;  Location: [facility] ENDOSCOPY;  Service: Cardiovascular;  Laterality: N/A;   TONSILLECTOMY       Assessment & Plan Clinical Impression: Patient is a 81 y.o.  female with history of  CAS, anemia, gastric ulcer, TVAR 11/22,  HTN, delirium  and recent admission to [facility] for SDH 2/2  after fall, recurrent fall with acute on chronic SDH requiring readmission 02/08 and underwent crani X 2 but continued to have progressive decline and was transitioned to comfort care due to poor recovery/obtundation. She was d/c to Hospice on 07/08/21 but after a few days in hospice, she was noted to have awakening and started interacting with family therefore family wanted to resume full care with tube feed for nutritional support. 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    Dr. Venetia Constable consulted for input and felt that  improvement in mentation likely due to [place] which as d/c at discharge as CT head showed no change in SDH.   He recommended endovascular MMA embolization to stop further hemorrhage if patient continued to improve after Keppra metabolites washed out but felt to have poor overall long term prognosis. Mentation has been improving with increase in verbal output and she was started on D3, thins by 02/20 due to marked oral delay and mild intermittent right pocketing.  She has had issues with confusion and agitation--has pulled out her foley as well as anxiety at nights affecting sleep. MMA embolization deferred due to anesthesia risks and concerns of not being liberated from vent.  She developed blister due to herpes zoster outbreak on right buttock 02/19 and was started on Valtrex. Therapy has been working with patient who is showing improvement in activity, able to follow commands with multimodal cues as well as cognitive linguistic ability. She continues to be limited by weakness, balance deficits with  scissoring and RLE weaknes. CIR recommended due to functional decline. Daughter notes worsening delirium today.   Patient currently requires mod assist with mobility secondary to muscle weakness, decreased cardiorespiratoy endurance, impaired timing and sequencing, unbalanced muscle activation, motor apraxia, decreased coordination, and decreased motor planning, decreased visual acuity, decreased initiation, decreased attention, and delayed processing, and decreased sitting balance, decreased standing balance, decreased balance strategies, and difficulty maintaining precautions.  Prior to hospitalization, patient was min assist with mobility and lived with Son in a House home.  Home access is 4-5Stairs to enter.  Patient will benefit from skilled PT intervention to maximize safe functional mobility, minimize fall risk, and decrease caregiver burden for planned discharge home with intermittent assist.  Anticipate patient will benefit from follow up [facility] at discharge.  PT - End of Session Activity Tolerance: Tolerates 30+ min activity with multiple rests Endurance Deficit: Yes PT Assessment Rehab Potential (ACUTE/IP ONLY): Good PT Barriers to Discharge: [place] home environment;Decreased caregiver support;Home environment access/layout;Lack of/limited family support;Insurance for SNF coverage;Behavior PT Patient demonstrates impairments in the following area(s): Balance;Behavior;Edema;Endurance;Motor;Nutrition;Pain;Perception;Safety;Sensory;Skin Integrity PT Transfers Functional Problem(s): Bed Mobility;Bed to Chair;Car;Furniture PT Locomotion Functional Problem(s): Ambulation;Wheelchair Mobility;Stairs PT Plan PT Intensity: Minimum of 1-2 x/day ,45 to 90 minutes PT Frequency: 5 out of 7 days PT Duration Estimated Length of Stay: 16-18 days PT Treatment/Interventions: Discharge planning;Ambulation/gait training;Functional mobility training;Psychosocial support;Therapeutic  Activities;Visual/perceptual remediation/compensation;Balance/vestibular training;Disease management/prevention;Neuromuscular re-education;Skin care/wound management;Therapeutic Exercise;Wheelchair propulsion/positioning;UE/LE Strength taining/ROM;Splinting/orthotics;Pain management;DME/adaptive equipment instruction;Cognitive remediation/compensation;Community reintegration;Functional electrical stimulation;Patient/family education;Stair training;UE/LE Coordination activities PT Transfers Anticipated Outcome(s): supervision PT Locomotion Anticipated Outcome(s): CGA PT Recommendation Recommendations for Other Services: Therapeutic Recreation consult Therapeutic Recreation Interventions: Clinical cytogeneticist;Outing/community reintergration Patient destination: Home Equipment Recommended: To be determined   PT Evaluation Precautions/Restrictions Precautions Precautions: Fall;Other (comment) Precaution Comments: expressive aphasia, cognitive deficits Restrictions Weight Bearing Restrictions: No General   Vital SignsTherapy Vitals Temp: 98.9 F (37.2 C) Temp Source: Oral Pulse Rate: 80 Resp: 16 BP: (!) 141/64 Patient Position (if appropriate): Lying Oxygen Therapy SpO2: 100 % O2 Device: Room Air Pain Pain Assessment Pain Scale: 0-10 Pain Score: 0-No pain Pain Interference Pain Interference Pain Effect on Sleep: 1. Rarely or not at all;2. Occasionally Pain Interference with Therapy Activities: 1. Rarely or not at all Pain Interference with Day-to-Day Activities: 1. Rarely or not at all Home Living/Prior [place] Available Help at Discharge: Family Type of Home: House Home Access: Stairs to enter Technical brewer of Steps: 4-5 Entrance Stairs-Rails: Right;Left;Can reach both Home Layout: One level Bathroom Shower/Tub: Government social research officer Accessibility: Yes Additional Comments: taken from other encounter as pt  has expressive deficits  Lives With: Son Prior Function Level of Independence: Other (comment) (unaware d/t pt's aphasia and cognitive deficits)  Able to Take Stairs?: Yes Driving: No Vision/Perception  Vision - History Ability to See in Adequate Light: 1 Impaired Vision - Assessment Additional Comments: Full range of motion Perception Perception: Impaired Inattention/Neglect: Does not attend to right visual field;Does not attend to right side of body Praxis Praxis: Impaired Praxis Impairment Details: Motor planning;Initiation  Cognition Overall Cognitive Status: Impaired/Different from baseline Arousal/Alertness: Awake/alert Orientation Level: Oriented to person;Oriented to situation Memory: Impaired Awareness: Appears intact Problem Solving:  Impaired Behaviors: Impulsive;Lability Safety/Judgment: Impaired Sensation Sensation Light Touch: Appears Intact Coordination Gross Motor Movements are Fluid and Coordinated: No Fine Motor Movements are Fluid and Coordinated: No Heel Shin Test: WFL with time Motor  Motor Motor: Other (comment) (hemipareisis) Motor - Skilled Clinical Observations: mild R hemi   Trunk/Postural Assessment  Cervical Assessment Cervical Assessment: Exceptions to [facility] (forward head) Thoracic Assessment Thoracic Assessment: Exceptions to [facility] (Rounded shoulders) Lumbar Assessment Lumbar Assessment: Exceptions to [facility] (posterior pelvic tilt) Postural Control Postural Control: Deficits on evaluation (mild lean to R)  Balance Balance Balance Assessed: Yes Static Sitting Balance Static Sitting - Balance Support: Left upper extremity supported;Feet supported Static Sitting - Level of Assistance: 5: Stand by assistance Dynamic Sitting Balance Dynamic Sitting - Balance Support: Bilateral upper extremity supported;Feet supported Dynamic Sitting - Level of Assistance: 4: Min assist Static Standing Balance Static Standing - Balance Support: Left upper  extremity supported Static Standing - Level of Assistance: 5: Stand by assistance;4: Min assist (CGA) Dynamic Standing Balance Dynamic Standing - Balance Support: Left upper extremity supported Dynamic Standing - Level of Assistance: 3: Mod assist Extremity Assessment      RLE Assessment RLE Assessment: Exceptions to [facility] RLE Strength RLE Overall Strength: Deficits Right Hip Flexion: 4-/5 Right Hip Extension: 4/5 Right Hip ABduction: 4+/5 Right Hip ADduction: 4/5 Right Knee Flexion: 4/5 Right Knee Extension: 4/5 Right Ankle Dorsiflexion: 4/5 Right Ankle Plantar Flexion: 4/5 LLE Assessment LLE Assessment: Exceptions to WFL LLE Strength LLE Overall Strength: Deficits Left Hip Flexion: 4+/5 Left Hip Extension: 4+/5 Left Hip ABduction: 4+/5 Left Hip ADduction: 4/5 Left Knee Flexion: 4/5 Left Knee Extension: 4+/5 Left Ankle Dorsiflexion: 4/5 Left Ankle Plantar Flexion: 4/5  Care Tool Care Tool Bed Mobility Roll left and right activity   Roll left and right assist level: Contact Guard/Touching assist    Sit to lying activity   Sit to lying assist level: Minimal Assistance - Patient > 75%    Lying to sitting on side of bed activity   Lying to sitting on side of bed assist level: the ability to move from lying on the back to sitting on the side of the bed with no back support.: Minimal Assistance - Patient > 75%     Care Tool Transfers Sit to stand transfer   Sit to stand assist level: Moderate Assistance - Patient 50 - 74%    Chair/bed transfer   Chair/bed transfer assist level: Moderate Assistance - Patient 50 - 74%     Toilet transfer   Assist Level: Moderate Assistance - Patient 50 - 74%    Car transfer   Car transfer assist level: Minimal Assistance - Patient > 75%      Care Tool Locomotion Ambulation   Assist level: Contact Guard/Touching assist Assistive device: No Device Max distance: 15 ft  Walk 10 feet activity   Assist level: Minimal Assistance -  Patient > 75% Assistive device: Hand held assist;No Device   Walk 50 feet with 2 turns activity Walk 50 feet with 2 turns activity did not occur: Safety/medical concerns      Walk 150 feet activity Walk 150 feet activity did not occur: Safety/medical concerns      Walk 10 feet on uneven surfaces activity Walk 10 feet on uneven surfaces activity did not occur: Safety/medical concerns      Stairs   Assist level: Minimal Assistance - Patient > 75% Stairs assistive device: 2 hand rails Max number of stairs: 8 (3" step height)  Walk  up/down 1 step activity   Walk up/down 1 step (curb) assist level: Minimal Assistance - Patient > 75% Walk up/down 1 step or curb assistive device: 2 hand rails  Walk up/down 4 steps activity   Walk up/down 4 steps assist level: Minimal Assistance - Patient > 75% Walk up/down 4 steps assistive device: 2 hand rails;No device  Walk up/down 12 steps activity Walk up/down 12 steps activity did not occur: Safety/medical concerns Walk up/down 12 steps assist level: Minimal Assistance - Patient > 75% Walk up/down 12 steps assistive device: Other (comment)  Pick up small objects from floor   Pick up small object from the floor assist level: Moderate Assistance - Patient 50 - 74%    Wheelchair Is the patient using a wheelchair?: Yes Type of Wheelchair: Manual   Wheelchair assist level: Minimal Assistance - Patient > 75% Max wheelchair distance: 150 ft  Wheel 50 feet with 2 turns activity   Assist Level: Minimal Assistance - Patient > 75%  Wheel 150 feet activity   Assist Level: Minimal Assistance - Patient > 75%    Refer to Care Plan for [place] 1    Recommendations for other services: Therapeutic Recreation  Pet therapy, Kitchen group, and Stress management  Skilled Therapeutic Intervention Mobility Bed Mobility Bed Mobility: Supine to Sit;Sit to Supine Supine to Sit: Contact Guard/Touching assist Sit to Supine: Minimal  Assistance - Patient > 75% Transfers Transfers: Sit to Stand;Stand to Sit;Stand Pivot Transfers Sit to Stand: Minimal Assistance - Patient > 75%;Moderate Assistance - Patient 50-74% Stand to Sit: Minimal Assistance - Patient > 75%;Moderate Assistance - Patient 50-74% Stand Pivot Transfers: Moderate Assistance - Patient 50 - 74% Stand Pivot Transfer Details: Verbal cues for safe use of DME/AE Transfer (Assistive device): Rolling walker Locomotion  Gait Ambulation: Yes Gait Assistance: Minimal Assistance - Patient > 75% Gait Distance (Feet): 15 Feet Assistive device: Rolling walker Gait Assistance Details: Verbal cues for safe use of DME/AE;Verbal cues for precautions/safety Gait Assistance Details: decreased pace with poor to fair dynamic balance Gait Gait: Yes Gait Pattern: Left flexed knee in stance;Trunk flexed;Narrow base of support Gait velocity: decreased Stairs / Additional Locomotion Stairs: Yes Stairs Assistance: Minimal Assistance - Patient > 75% Stair Management Technique: Two rails Number of Stairs: 8 Height of Stairs: 3   PT Evaluation completed; see above for results. PT educated patient in roles of PT vs OT, PT POC, rehab potential, rehab goals, and discharge recommendations along with recommendation for follow-up rehabilitation services. Individual treatment initiated:  Patient supine in bed upon PT arrival. Patient alert and agreeable to PT session. No pain complaint during session. Throughout session, pt begins sentences but is unable to finish a sentence. Intermittently verbalizes unintelligible speech.  Therapeutic Activity: Bed Mobility: Patient performed supine to/from sit with Min/ modA and slow to initiate. Provided verbal cues for technique, movements required, safety.. Transfers: Patient performed sit <> stand and stand pivot transfers with  CGA up to [facility] for balance. Pt is sometimes impulsive and requires physical assist for maintaining balance with  missteps. Provided vc/tc for focus and widening BOS.  Gait Training:  Patient ambulated 15 feet x2 using HHA from therapist with up to [place]. Ambulated with NBOS and decreased balance. Provided vc/tc for widening BOS, upright posture.  Pt is able to ascend/ descend eight 3" steps with CGA/ MinA using BHR throughout. VC/ tc for balance, sequencing.  Patient supine in bed at end of session with brakes locked,  bed alarm set, and all needs within reach. RN and NT informed of pt's LOA and position.   Discharge Criteria: Patient will be discharged from PT if patient refuses treatment 3 consecutive times without medical reason, if treatment goals not met, if there is a change in medical status, if patient makes no progress towards goals or if patient is discharged from hospital.  The above assessment, treatment plan, treatment alternatives and goals were discussed and mutually agreed upon: by patient  Alger Simons 07/18/2021, 10:08 PM

## 2021-07-18 NOTE — Evaluation (Signed)
Speech Language Pathology Assessment and Plan  Patient Details  Name: Denise Page MRN: [id number] Date of Birth: 07/16/1940  SLP Diagnosis: Apraxia;Aphasia;Cognitive Impairments;Dysphagia  Rehab Potential: Fair ELOS: 12-16 days    Today's Date: 07/18/2021 SLP Individual Time: 1105-1200 SLP Individual Time Calculation (min): 55 min   Hospital Problem: Principal Problem:   SDH (subdural hematoma) Active Problems:   Hypokalemia   Dysphagia, post-stroke   Chronic diastolic congestive heart failure (HCC)   Essential hypertension   Hemiparesis affecting left side as late effect of stroke [facility])  Past Medical History:  Past Medical History:  Diagnosis Date   Anemia    Aortic stenosis 03/2021   s/p TVAR   Bilateral carotid artery disease (HCC)    Enlarged thyroid    Gastric ulcer    Hyperlipidemia    Hypertension    Mitral stenosis    NSTEMI (non-ST elevated myocardial infarction) [facility])    Past Surgical History:  Past Surgical History:  Procedure Laterality Date   BIOPSY  08/01/2020   Procedure: BIOPSY;  Surgeon: Ronnette Juniper, MD;  Location: [facility];  Service: Gastroenterology;;  EGD and COLON   CHOLECYSTECTOMY     COLONOSCOPY N/A 08/01/2020   Procedure: COLONOSCOPY;  Surgeon: Ronnette Juniper, MD;  Location: [facility] ENDOSCOPY;  Service: Gastroenterology;  Laterality: N/A;   CYSTECTOMY     ESOPHAGOGASTRODUODENOSCOPY (EGD) WITH PROPOFOL N/A 08/01/2020   Procedure: ESOPHAGOGASTRODUODENOSCOPY (EGD) WITH PROPOFOL;  Surgeon: Ronnette Juniper, MD;  Location: [facility] ENDOSCOPY;  Service: Gastroenterology;  Laterality: N/A;   POLYPECTOMY  08/01/2020   Procedure: POLYPECTOMY;  Surgeon: Ronnette Juniper, MD;  Location: [facility] ENDOSCOPY;  Service: Gastroenterology;;   TEE WITHOUT CARDIOVERSION N/A 11/13/2020   Procedure: TRANSESOPHAGEAL ECHOCARDIOGRAM (TEE);  Surgeon: Josue Hector, MD;  Location: [facility] ENDOSCOPY;  Service: Cardiovascular;  Laterality: N/A;   TONSILLECTOMY      Assessment / Plan /  Recommendation Clinical Impression  81 y.o. female presents to [facility] hospital on 07/10/2021 from hospice after recent SDH with multiple evacuations and recurrences while admitted at outside hospital. Pt with neurological improvement at hospice once taken off keppra. CTH demonstrates mixed density L SDH with some hyperdense material, likely acute on chronic hemorrhage, stable MLS from prior [place] at OSH. PMH includes gastric ulcers, HLD, HTN, facial fxs, AVR.  Admitted to CIR and SLP evaluation completed on this date with results as follows:   Pt presents with mildly delayed mastication of soft solids which SLP suspects is likely more attributable to missing dentition versus oral motor weakness post SDH.  Pt was able to clear boluses from the oral cavity without difficulty but she did have mild anterior loss of lower viscosity boluses due to slightly decreased labial seal.  No overt s/s of aspiration were evident with solids or liquids.  Recommend that pt remain on her currently prescribed diet to allow pt to choose from a broader variety of foods; however, her sister indicated that pt had a preference for softer, pureed foods at baseline and her diet may be downgraded if needed to accommodate pt preference.    Pt also presents with multifactorial communication deficits.  She presents with motor planning deficits characterized by groping for words and her verbal expression is limited to several repetitive simple phrases and automatic social exchanges.  Her confrontational naming in structured tasks was relatively spared for single syllable words but begins to deteriorate with multisyllabic words, requiring max cues to correct.  Her responses to open ended and yes/no questions were much more reliable when providing  biographical information versus novel information.  Pt also needs max cues to correct written errors when writing from a visual model at the word level.  Pt also exhibits moderately severe cognitive deficits  characterized by decreased sustained attention to tasks and decreased safety awareness with impulsivity.    Given the abovementioned deficits, pt would benefit from skilled ST while inpatient in order to maximize functional independence and reduce burden of care prior to discharge.  Anticipate that pt will need 24/7 supervision at discharge in addition to [place] follow up at next level of care.     Skilled Therapeutic Interventions          Cognitive-linguistic and bedside swallow evaluation completed with results and recommendations reviewed with family.    SLP Assessment  Patient will need skilled Speech Lanaguage Pathology Services during CIR admission    Recommendations  SLP Diet Recommendations: Dysphagia 3 (Mech soft);Thin Liquid Administration via: Cup Medication Administration: Whole meds with puree Supervision: Full supervision/cueing for compensatory strategies Compensations: Minimize environmental distractions;Slow rate;Small sips/bites Postural Changes and/or Swallow Maneuvers: Out of bed for meals;Seated upright 90 degrees Oral Care Recommendations: Oral care BID Patient destination: Home Follow up Recommendations: Home Health SLP Equipment Recommended: None recommended by SLP    SLP Frequency 3 to 5 out of 7 days   SLP Duration  SLP Intensity  SLP Treatment/Interventions 12-16 days  Minumum of 1-2 x/day, 30 to 90 minutes  Cognitive remediation/compensation;Cueing hierarchy;Internal/external aids;Patient/family education;Speech/Language facilitation;Environmental controls;Dysphagia/aspiration precaution training    Pain Pain Assessment Pain Scale: 0-10 Pain Score: 0-No pain  Prior Functioning Cognitive/Linguistic Baseline: Within functional limits Type of Home: House  Lives With: Son Available Help at Discharge: Family  SLP Evaluation Cognition Overall Cognitive Status: Impaired/Different from baseline Arousal/Alertness: Awake/alert Safety/Judgment:  Impaired Comments: impulsive [employer] Scales of Cognitive Functioning: Confused/appropriate  Comprehension Auditory Comprehension Overall Auditory Comprehension: Impaired Yes/No Questions: Impaired Basic Biographical Questions: 76-100% accurate Commands: Impaired One Step Basic Commands: 75-100% accurate Interfering Components: Attention;Motor planning EffectiveTechniques: Repetition Expression Expression Primary Mode of Expression: Verbal Verbal Expression Overall Verbal Expression: Impaired Initiation: Impaired Automatic Speech: Social Response;Day of week;Month of year Level of Generative/Spontaneous Verbalization: Phrase Repetition: Impaired Level of Impairment: Word level Naming: Impairment Responsive: 26-50% accurate Confrontation: Impaired Verbal Errors: Phonemic paraphasias;Semantic paraphasias;Aware of errors Pragmatics: No impairment Effective Techniques: Sentence completion;Phonemic cues;Written cues Written Expression Dominant Hand: Left Written Expression: Exceptions to [facility] Copy Ability: Word Oral Motor Oral Motor/Sensory Function Overall Oral Motor/Sensory Function: Mild impairment Facial Symmetry: Abnormal symmetry right;Suspected CN VII (facial) dysfunction Motor Speech Overall Motor Speech: Impaired Articulation: Impaired Intelligibility: Intelligibility reduced Phrase: 50-74% accurate Motor Planning: Impaired Level of Impairment: Phrase Motor Speech Errors: Groping for words  Care Tool Care Tool Cognition Ability to hear (with hearing aid or hearing appliances if normally used Ability to hear (with hearing aid or hearing appliances if normally used): 1. Minimal difficulty - difficulty in some environments (e.g. when person speaks softly or setting is noisy)   Expression of Ideas and Wants Expression of Ideas and Wants: 2. Frequent difficulty - frequently exhibits difficulty with expressing needs and ideas   Understanding Verbal and  Non-Verbal Content Understanding Verbal and Non-Verbal Content: 2. Sometimes understands - understands only basic conversations or simple, direct phrases. Frequently requires cues to understand  Memory/Recall Ability Memory/Recall Ability : None of the above were recalled   PMSV Assessment  PMSV Trial Intelligibility: Intelligibility reduced Phrase: 50-74% accurate  Bedside Swallowing Assessment General Diet Prior to this Study: Dysphagia 3 (soft);Thin  liquids Temperature Spikes Noted: No Respiratory Status: Room air History of Recent Intubation: No Behavior/Cognition: Alert;Cooperative;Pleasant mood;Requires cueing Oral Cavity - Dentition: Poor condition;Missing dentition Self-Feeding Abilities: Able to feed self;Needs assist Patient Positioning: Upright in chair/Tumbleform Baseline Vocal Quality: Normal Volitional Cough: Cognitively unable to elicit  Oral Care Assessment   Ice Chips   Thin Liquid Thin Liquid: Within functional limits Nectar Thick   Honey Thick   Puree Puree: Within functional limits Solid Solid: Within functional limits BSE Assessment Risk for Aspiration Impact on safety and function: Mild aspiration risk Other Related Risk Factors: Cognitive impairment;Deconditioning  Short Term Goals: Week 1: SLP Short Term Goal 1 (Week 1): Pt will consume dys 3 textures and thin liquids with supervision cues for use of swallowing precautions and minimal overt s/s of aspiration. SLP Short Term Goal 2 (Week 1): Pt will recognize and correct verbal errors at the short phrase level with mod assist multimodal cues. SLP Short Term Goal 3 (Week 1): Pt will copy words from a written model during structured tasks with mod assist multimodal cues to correct written errors. SLP Short Term Goal 4 (Week 1): Pt will convey immediate needs and wants to caregivers at the phrase level with mod assist multimodal cues.  Refer to Care Plan for Long Term Goals  Recommendations for other  services: None   Discharge Criteria: Patient will be discharged from SLP if patient refuses treatment 3 consecutive times without medical reason, if treatment goals not met, if there is a change in medical status, if patient makes no progress towards goals or if patient is discharged from hospital.  The above assessment, treatment plan, treatment alternatives and goals were discussed and mutually agreed upon: by patient and by family  , Selinda Orion 07/18/2021, 4:04 PM

## 2021-07-18 NOTE — Evaluation (Addendum)
Occupational Therapy Assessment and Plan  Patient Details  Name: Denise Page MRN: [id number] Date of Birth: 27-Aug-1940  OT Diagnosis: abnormal posture, apraxia, cognitive deficits, hemiplegia affecting non-dominant side, and muscle weakness (generalized) Rehab Potential: Rehab Potential (ACUTE ONLY): Good ELOS: 12-16   Today's Date: 07/18/2021 OT Individual Time: 3785-8850 OT Individual Time Calculation (min): 75 min     Hospital Problem: Principal Problem:   SDH (subdural hematoma)   Past Medical History:  Past Medical History:  Diagnosis Date   Anemia    Aortic stenosis 03/2021   s/p TVAR   Bilateral carotid artery disease ([place])    Enlarged thyroid    Gastric ulcer    Hyperlipidemia    Hypertension    Mitral stenosis    NSTEMI (non-ST elevated myocardial infarction) [facility])    Past Surgical History:  Past Surgical History:  Procedure Laterality Date   BIOPSY  08/01/2020   Procedure: BIOPSY;  Surgeon: Ronnette Juniper, MD;  Location: [facility];  Service: Gastroenterology;;  EGD and COLON   CHOLECYSTECTOMY     COLONOSCOPY N/A 08/01/2020   Procedure: COLONOSCOPY;  Surgeon: Ronnette Juniper, MD;  Location: [facility];  Service: Gastroenterology;  Laterality: N/A;   CYSTECTOMY     ESOPHAGOGASTRODUODENOSCOPY (EGD) WITH PROPOFOL N/A 08/01/2020   Procedure: ESOPHAGOGASTRODUODENOSCOPY (EGD) WITH PROPOFOL;  Surgeon: Ronnette Juniper, MD;  Location: [facility];  Service: Gastroenterology;  Laterality: N/A;   POLYPECTOMY  08/01/2020   Procedure: POLYPECTOMY;  Surgeon: Ronnette Juniper, MD;  Location: [facility];  Service: Gastroenterology;;   TEE WITHOUT CARDIOVERSION N/A 11/13/2020   Procedure: TRANSESOPHAGEAL ECHOCARDIOGRAM (TEE);  Surgeon: Josue Hector, MD;  Location: [facility] ENDOSCOPY;  Service: Cardiovascular;  Laterality: N/A;   TONSILLECTOMY      Assessment & Plan Clinical Impression: 81 y.o. female presents to [facility] hospital on 07/10/2021 from hospice after recent SDH with multiple  evacuations and recurrences while admitted at outside hospital. Pt with neurological improvement at hospice once taken off keppra. CTH demonstrates mixed density L SDH with some hyperdense material, likely acute on chronic hemorrhage, stable MLS from prior [place] at OSH. PMH includes gastric ulcers, HLD, HTN, facial fxs, AVR.   Patient currently requires mod with basic self-care skills secondary to muscle weakness, decreased cardiorespiratoy endurance, impaired timing and sequencing, unbalanced muscle activation, motor apraxia, and decreased coordination, decreased attention to right, decreased attention, decreased awareness, decreased problem solving, decreased safety awareness, decreased memory, delayed processing, and demonstrates behaviors consistent with [place] Level 6, and decreased sitting balance, decreased standing balance, decreased postural control, hemiplegia, and decreased balance strategies.  Prior to hospitalization, patient could complete BADL with supervision.  Patient will benefit from skilled intervention to decrease level of assist with basic self-care skills and increase independence with basic self-care skills prior to discharge home with care partner.  Anticipate patient will require 24 hour supervision and follow up home health.  OT - End of Session Activity Tolerance: Tolerates 30+ min activity with multiple rests Endurance Deficit: Yes OT Assessment Rehab Potential (ACUTE ONLY): Good OT Barriers to Discharge: Decreased caregiver support OT Patient demonstrates impairments in the following area(s): Behavior;Balance;Cognition;Endurance;Motor;Pain;Perception;Safety;Sensory;Vision;Skin Integrity OT Basic ADL's Functional Problem(s): Eating;Grooming;Bathing;Dressing;Toileting OT Transfers Functional Problem(s): Toilet;Tub/Shower OT Additional Impairment(s): Fuctional Use of Upper Extremity OT Plan OT Intensity: Minimum of 1-2 x/day, 45 to 90 minutes OT Frequency: 5 out of 7  days OT Duration/Estimated Length of Stay: 12-16 OT Treatment/Interventions: Balance/vestibular training;Discharge planning;Functional electrical stimulation;Pain management;Self Care/advanced ADL retraining;Therapeutic Activities;UE/LE Coordination activities;Cognitive remediation/compensation;Disease mangement/prevention;Functional mobility training;Patient/family education;Skin care/wound managment;Therapeutic Exercise;Visual/perceptual  remediation/compensation;Wheelchair propulsion/positioning;UE/LE Strength taining/ROM;Splinting/orthotics;Psychosocial support;Neuromuscular re-education;DME/adaptive equipment instruction;Community reintegration OT Self Feeding Anticipated Outcome(s): S OT Basic Self-Care Anticipated Outcome(s): S OT Toileting Anticipated Outcome(s): S OT Bathroom Transfers Anticipated Outcome(s): S OT Recommendation Patient destination: Home Follow Up Recommendations: Home health OT Equipment Recommended: 3 in 1 bedside comode;Tub/shower bench;To be determined   OT Evaluation Precautions/Restrictions  Precautions Precautions: Fall;Other (comment) Precaution Comments: aphasic Restrictions Weight Bearing Restrictions: No General   Vital Signs Therapy Vitals Pulse Rate: 84 BP: (!) 118/58 Patient Position (if appropriate): Lying Oxygen Therapy SpO2: 98 % O2 Device: Room Air Patient Activity (if Appropriate): In bed Pain Pain Assessment Pain Score: 0-No pain Faces Pain Scale: No hurt Home Living/Prior Functioning Home Living Family/patient expects to be discharged to:: Private residence Available Help at Discharge: Family Type of Home: House Home Access: Stairs to enter Technical brewer of Steps: 4-5 Entrance Stairs-Rails: Right, Left, Can reach both Home Layout: One level Bathroom Shower/Tub: Chiropodist: Standard Additional Comments: taken from other encounter as pt wiht expressive deficits  Lives With: Son Vision Baseline  Vision/History:  (unable to report) Ability to See in Adequate Light: 1 Impaired Additional Comments: able to scan in B directions wiht prompting Perception  Perception: Impaired Inattention/Neglect: Does not attend to right visual field;Does not attend to right side of body Praxis Praxis: Impaired Praxis Impairment Details: Motor planning Cognition Overall Cognitive Status: Impaired/Different from baseline Arousal/Alertness: Awake/alert Orientation Level: Person Year: 2026 Month: May Day of Week: Incorrect Memory: Impaired Immediate Memory Recall: Bed Memory Recall Sock: Not able to recall Memory Recall Blue: Not able to recall Memory Recall Bed: Not able to recall Awareness: Appears intact Safety/Judgment: Impaired [employer] Scales of Cognitive Functioning: Confused/appropriate Sensation Sensation Light Touch: (P) Appears Intact Coordination Gross Motor Movements are Fluid and Coordinated: (P) No Fine Motor Movements are Fluid and Coordinated: (P) No Motor  Motor Motor: Hemiplegia Motor - Skilled Clinical Observations: mild R hemi  Trunk/Postural Assessment  Cervical Assessment Cervical Assessment:  (head forward) Thoracic Assessment Thoracic Assessment:  (rounded shoudlers) Lumbar Assessment Lumbar Assessment:  (post pelvic tilt) Postural Control Postural Control: Deficits on evaluation (delayed/insufficient R>L)  Balance Balance Balance Assessed: Yes Dynamic Sitting Balance Dynamic Sitting - Level of Assistance: 5: Stand by assistance Static Standing Balance Static Standing - Balance Support: Left upper extremity supported Static Standing - Level of Assistance: 4: Min assist Dynamic Standing Balance Dynamic Standing - Balance Support: Left upper extremity supported Dynamic Standing - Level of Assistance: 3: Mod assist Extremity/Trunk Assessment RUE Assessment RUE Assessment: Exceptions to [facility] RUE Body System: Neuro Brunstrum levels for arm and  hand: Arm;Hand Brunstrum level for arm: Stage V Relative Independence from Synergy Brunstrum level for hand: Stage V Independence from basic synergies LUE Assessment LUE Assessment: Within Functional Limits General Strength Comments: generlized weakness  Care Tool Care Tool Self Care Eating        Oral Care         Bathing              Upper Body Dressing(including orthotics)            Lower Body Dressing (excluding footwear)          Putting on/Taking off footwear             Care Tool Toileting Toileting activity         Care Tool Bed Mobility Roll left and right activity        Sit to lying activity  Lying to sitting on side of bed activity         Care Tool Transfers Sit to stand transfer        Chair/bed transfer         Toilet transfer         Care Tool Cognition  Expression of Ideas and Wants    Understanding Verbal and Non-Verbal Content     Memory/Recall Ability     Refer to Care Plan for Long Term Goals  SHORT TERM GOAL WEEK 1 OT Short Term Goal 1 (Week 1): Pt will groom in standing with CGA to demo improved standing tolerance OT Short Term Goal 2 (Week 1): Pt will transfer to toilet wiht CGA and LRAD OT Short Term Goal 3 (Week 1): Pt will bathe wiht no more than min cuing for sequencing OT Short Term Goal 4 (Week 1): Pt will scan to R for 5/5 ADL items with no cuing to demo imroved R attention  Recommendations for other services: None    Skilled Therapeutic Intervention 1:1. Pt received in bed agreeable to OT after education on TBI recovery, ELOS, POC and goal setting. Pt particiates in ADL as written below for assist levels. Skilled interventions include cuing for postural alignment, facilitation of posture during transfers, increased time to process and respond, setting up items on R for R attention/scanning, and concise cuing for improved processing. Exited session with pt seated in bed, exit alarm on and call light  in reach   ADL ADL Grooming: Moderate cueing;Minimal assistance Where Assessed-Grooming: Standing at sink Upper Body Bathing: Supervision/safety Where Assessed-Upper Body Bathing: Sitting at sink Lower Body Bathing: Moderate assistance Where Assessed-Lower Body Bathing: Sitting at sink;Standing at sink Upper Body Dressing: Moderate assistance Where Assessed-Upper Body Dressing: Sitting at sink Lower Body Dressing: Maximal assistance Where Assessed-Lower Body Dressing: Sitting at sink;Standing at sink Toileting: Maximal assistance Where Assessed-Toileting: Glass blower/designer: Moderate assistance Toilet Transfer Method: Stand pivot Toilet Transfer Equipment: Grab bars Mobility  Transfers Sit to Stand: Minimal Assistance - Patient > 75%;Moderate Assistance - Patient 50-74% (mod From low surfaces) Stand to Sit: Minimal Assistance - Patient > 75%;Moderate Assistance - Patient 50-74%   Discharge Criteria: Patient will be discharged from OT if patient refuses treatment 3 consecutive times without medical reason, if treatment goals not met, if there is a change in medical status, if patient makes no progress towards goals or if patient is discharged from hospital.  The above assessment, treatment plan, treatment alternatives and goals were discussed and mutually agreed upon: No family available/patient unable  Tonny Branch 07/18/2021, 12:23 PM

## 2021-07-19 NOTE — Progress Notes (Signed)
Physical Therapy TBI Note  Patient Details  Name: Denise Page MRN: [id number] Date of Birth: [birth year]  Today's Date: 07/19/2021 PT Individual Time: 1000-1100 PT Individual Time Calculation (min): 60 min   Short Term Goals: Week 1:     Skilled Therapeutic Interventions/Progress Updates:     Patient in bed with her daughter at bedside upon PT arrival. Patient alert and agreeable to PT session. Patient denied pain during session.  Patient pleasant and slightly impulsive throughout session. Presents with expressive aphasia with good attempts to work through expressive challenges with increased time.   Therapeutic Activity: Bed Mobility: Patient performed supine to/from sit with supervision. Provided verbal cues for initiation and safety awareness. Transfers: Patient performed sit to/from stand x7 with min A without an AD. Provided verbal cues for initiation, forward weight shift, completing turns to sit, and reaching back to sit for safety.  Gait Training:  Patient ambulated 14 feet x4 and 6 feet x1 using HHA with min A. Ambulated with decreased gait speed, decreased step length and height, mild shuffling gait, forward trunk lean, and downward head gaze. Provided verbal cues for erect posture, looking ahead, safety awareness (pt tends to reach out for things that are far away), and management of impulsivity. Patient self initiated walking back to bed when she was fatigued, required min-mod A for safety due to impulsivity.   Neuromuscular Re-ed: Patient performed the Berg Balance Test: Merrilee Jansky Balance Test Sit to Stand: Able to stand  independently using hands Standing Unsupported: Able to stand 2 minutes with supervision Sitting with Back Unsupported but Feet Supported on Floor or Stool: Able to sit safely and securely 2 minutes Stand to Sit: Controls descent by using hands Transfers: Needs one person to assist Standing Unsupported with Eyes Closed: Able to stand 10 seconds with  supervision Standing Ubsupported with Feet Together: Needs help to attain position and unable to hold for 15 seconds From Standing, Reach Forward with Outstretched Arm: Loses balance while trying/requires external support From Standing Position, Pick up Object from Floor: Unable to try/needs assist to keep balance From Standing Position, Turn to Look Behind Over each Shoulder: Needs assist to keep from losing balance and falling Turn 360 Degrees: Needs assistance while turning Standing Unsupported, Alternately Place Feet on Step/Stool: Needs assistance to keep from falling or unable to try Standing Unsupported, One Foot in Front: Loses balance while stepping or standing Standing on One Leg: Unable to try or needs assist to prevent fall Total Score: 17/56 Patient demonstrated increased fall risk noted by score of 17/56 on the Berg Balance Scale.  <45/56 = fall risk, <42/56 = predictive of recurrent falls, <40/56 = 100% fall risk  >41 = independent, 21-40 = assistive device, 0-20 = wheelchair level  MDC 6.9 (4 pts 45-56, 5 pts 35-44, 7 pts 25-34) (ANPTA Core Set of Outcome Measures for Adults with Neurologic Conditions, 2018)  Patient in bed at end of session with breaks locked, bed alarm set, and all needs within reach.   Therapy Documentation Precautions:  Precautions Precautions: Fall, Other (comment) Precaution Comments: expressive aphasia, cognitive deficits Restrictions Weight Bearing Restrictions: No Agitated Behavior Scale: TBI Observation Details Observation Environment: Pt's room Start of observation period - Date: 07/19/21 Start of observation period - Time: 1000 End of observation period - Date: 07/19/21 End of observation period - Time: 1100 Agitated Behavior Scale (DO NOT LEAVE BLANKS) Short attention span, easy distractibility, inability to concentrate: Present to a slight degree Impulsive, impatient, low tolerance for pain or frustration:  Present to a slight  degree Uncooperative, resistant to care, demanding: Absent Violent and/or threatening violence toward people or property: Absent Explosive and/or unpredictable anger: Absent Rocking, rubbing, moaning, or other self-stimulating behavior: Absent Pulling at tubes, restraints, etc.: Absent Wandering from treatment areas: Absent Restlessness, pacing, excessive movement: Absent Repetitive behaviors, motor, and/or verbal: Absent Rapid, loud, or excessive talking: Absent Sudden changes of mood: Absent Easily initiated or excessive crying and/or laughter: Absent Self-abusiveness, physical and/or verbal: Absent Agitated behavior scale total score: 16      Therapy/Group: Individual Therapy  Nichlos Kunzler L Nahmir Zeidman PT, DPT  07/19/2021, 1:00 PM

## 2021-07-20 ENCOUNTER — Inpatient Hospital Stay (HOSPITAL_COMMUNITY): Payer: Medicare Other

## 2021-07-20 LAB — BASIC METABOLIC PANEL
Anion gap: 9 (ref 5–15)
BUN: 15 mg/dL (ref 8–23)
CO2: 25 mmol/L (ref 22–32)
Calcium: 8.7 mg/dL — ABNORMAL LOW (ref 8.9–10.3)
Chloride: 104 mmol/L (ref 98–111)
Creatinine, Ser: 1.02 mg/dL — ABNORMAL HIGH (ref 0.44–1.00)
GFR, Estimated: 56 mL/min — ABNORMAL LOW (ref >60)
Glucose, Bld: 97 mg/dL (ref 70–99)
Potassium: 4.1 mmol/L (ref 3.5–5.1)
Sodium: 138 mmol/L (ref 135–145)

## 2021-07-20 LAB — CBC
HCT: 24.3 % — ABNORMAL LOW (ref 36.0–46.0)
Hemoglobin: 7.9 g/dL — ABNORMAL LOW (ref 12.0–15.0)
MCH: 28.5 pg (ref 26.0–34.0)
MCHC: 32.5 g/dL (ref 30.0–36.0)
MCV: 87.7 fL (ref 80.0–100.0)
Platelets: 208 10*3/uL (ref 150–400)
RBC: 2.77 MIL/uL — ABNORMAL LOW (ref 3.87–5.11)
RDW: 15.7 % — ABNORMAL HIGH (ref 11.5–15.5)
WBC: 3.6 10*3/uL — ABNORMAL LOW (ref 4.0–10.5)
nRBC: 0 % (ref 0.0–0.2)

## 2021-07-20 MED ORDER — PANTOPRAZOLE SODIUM 40 MG PO TBEC
40.0000 mg | DELAYED_RELEASE_TABLET | Freq: Two times a day (BID) | ORAL | Status: DC
Start: 2021-07-20 — End: 2021-07-25
  Administered 2021-07-20 – 2021-07-25 (×10): 40 mg via ORAL
  Filled 2021-07-20 (×10): qty 1

## 2021-07-20 NOTE — Progress Notes (Addendum)
[facility] PHYSICAL MEDICINE & REHABILITATION PROGRESS NOTE  Subjective/Complaints: Pt up in chair working on her breakfast. Denied any new problems. Indicated that she's working towards being ready to go home!  ROS: limited due to language/communication   Objective: Vital Signs: Blood pressure 136/63, pulse 86, temperature 98.6 F (37 C), resp. rate 18, height 5\' 8"  (1.727 m), weight 72.9 kg, SpO2 94 %. No results found. Recent Labs    07/18/21 0532 07/20/21 0738  WBC 4.1 3.6*  HGB 7.9* 7.9*  HCT 25.0* 24.3*  PLT 202 208   Recent Labs    07/18/21 0532 07/20/21 0738  NA 137 138  K 4.0 4.1  CL 102 104  CO2 24 25  GLUCOSE 108* 97  BUN 15 15  CREATININE 0.93 1.02*  CALCIUM 8.8* 8.7*    Intake/Output Summary (Last 24 hours) at 07/20/2021 1039 Last data filed at 07/20/2021 0900 Gross per 24 hour  Intake 1007 ml  Output --  Net 1007 ml        Physical Exam: BP 136/63 (BP Location: Left Arm)    Pulse 86    Temp 98.6 F (37 C)    Resp 18    Ht 5\' 8"  (1.727 m)    Wt 72.9 kg    SpO2 94%    BMI 24.44 kg/m  Constitutional: No distress . Vital signs reviewed. HEENT:   EOMI, oral membranes moist Neck: supple Cardiovascular: RRR without murmur. No JVD    Respiratory/Chest: CTA Bilaterally without wheezes or rales. Normal effort    GI/Abdomen: BS +, non-tender, non-distended Ext: no clubbing, cyanosis, or edema Psych: pleasant and cooperative  Skin: Warm and dry.  Left crani scar CDI with staples.Marland Kitchen Psych: Normal mood.  Normal behavior. Musc: No edema in extremities.  No tenderness in extremities. Neuro: Alert and oriented. Word finding deficits. Able to ID simple objects in room. Ideational apraxia. No focal CN findings. Mild dysarthria.  Motor: LUE/LE: 4 -/5 with apraxia, RUE/RLE 5/5. No focal sensory deficits.     Assessment/Plan: 1. Functional deficits which require 3+ hours per day of interdisciplinary therapy in a comprehensive inpatient rehab  setting. Physiatrist is providing close team supervision and 24 hour management of active medical problems listed below. Physiatrist and rehab team continue to assess barriers to discharge/monitor patient progress toward functional and medical goals   Care Tool:  Bathing    Body parts bathed by patient: Right arm, Left arm, Chest, Abdomen, Right upper leg, Left upper leg, Face   Body parts bathed by helper: Front perineal area, Buttocks, Right lower leg, Left lower leg     Bathing assist Assist Level: Moderate Assistance - Patient 50 - 74%     Upper Body Dressing/Undressing Upper body dressing   What is the patient wearing?: Bra, Pull over shirt    Upper body assist Assist Level: Moderate Assistance - Patient 50 - 74%    Lower Body Dressing/Undressing Lower body dressing      What is the patient wearing?: Pants, Incontinence brief     Lower body assist Assist for lower body dressing: Maximal Assistance - Patient 25 - 49%     Toileting Toileting    Toileting assist Assist for toileting: Contact Guard/Touching assist     Transfers Chair/bed transfer  Transfers assist     Chair/bed transfer assist level: Moderate Assistance - Patient 50 - 74%     Locomotion Ambulation   Ambulation assist      Assist level: Contact Guard/Touching assist Assistive  device: No Device Max distance: 15 ft   Walk 10 feet activity   Assist     Assist level: Minimal Assistance - Patient > 75% Assistive device: Hand held assist, No Device   Walk 50 feet activity   Assist Walk 50 feet with 2 turns activity did not occur: Safety/medical concerns         Walk 150 feet activity   Assist Walk 150 feet activity did not occur: Safety/medical concerns         Walk 10 feet on uneven surface  activity   Assist Walk 10 feet on uneven surfaces activity did not occur: Safety/medical concerns         Wheelchair     Assist Is the patient using a wheelchair?:  Yes Type of Wheelchair: Manual    Wheelchair assist level: Minimal Assistance - Patient > 75% Max wheelchair distance: 150 ft    Wheelchair 50 feet with 2 turns activity    Assist        Assist Level: Minimal Assistance - Patient > 75%   Wheelchair 150 feet activity     Assist      Assist Level: Minimal Assistance - Patient > 75%  BP 136/63 (BP Location: Left Arm)    Pulse 86    Temp 98.6 F (37 C)    Resp 18    Ht 5\' 8"  (1.727 m)    Wt 72.9 kg    SpO2 94%    BMI 24.44 kg/m    Medical Problem List and Plan: 1. 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   -Continue CIR therapies including PT, OT, and SLP  2.  Antithrombotics: -DVT/anticoagulation:  Mechanical: Sequential compression devices, below knee Bilateral lower extremities             -antiplatelet therapy: N/A 3. Pain Management: Tylenol prn.  4. Mood: LCSW to follow for evaluation and support when appropriate.              -antipsychotic agents: N/A 5. Neuropsych: This patient is not fully capable of making decisions on her own behalf. 6. Skin/Wound Care: staples out soon 7. Fluids/Electrolytes/Nutrition: Monitor I/OS 8. HTN:  -Added magnesium gluconate 250mg  HS to assist blood pressure control-   -continue coreg -bp's improved 9. Chronic diastolic CHF/TVAR 00/71: on Coreg bid.   -no signs of overload.             --added low salt restrictions.  Filed Weights   07/17/21 1653 07/17/21 1700  Weight: 72.9 kg 72.9 kg   10. Herpes Zoster: valtrex ends today  -dc airborne precautions 11. H/o depression/anxiety: Continue Lexapro and buspar to manage symptoms. --sleep/wake reasonable at present   12. H/o PUD/acute on chronic anemia:   Hemoglobin 8.7 on 2/18---> down to 7.9 2/27---no signs of gross blood   -recheck Wednesday.   -protonix 13. Impaired fasting glucose: will continue to monitor FBS with routine labs.  14. Delirium: continue safety measures  with tele monitor. Discussed benefits of telesitter with daugghter.  15.  Abdominal pain: Urinary retention? Improved  -appears to be voiding well. 16.  Hypokalemia  Potassium stable at 4.1 2/27 17.  Poststroke dysphagia  D3 thins, advance diet as tolerated     LOS: 3 days A FACE TO FACE EVALUATION WAS PERFORMED  Meredith Staggers 07/20/2021, 10:39 AM

## 2021-07-20 NOTE — Progress Notes (Signed)
Inpatient Rehabilitation Care Coordinator Assessment and Plan Patient Details  Name: Denise Page MRN: [id number] Date of Birth: [birth year]  Today's Date: 07/20/2021  Hospital Problems: Principal Problem:   SDH (subdural hematoma) Active Problems:   Hypokalemia   Dysphagia, post-stroke   Chronic diastolic congestive heart failure ([place])   Essential hypertension   Hemiparesis affecting left side as late effect of stroke [facility])  Past Medical History:  Past Medical History:  Diagnosis Date   Anemia    Aortic stenosis 03/2021   s/p TVAR   Bilateral carotid artery disease (HCC)    Enlarged thyroid    Gastric ulcer    Hyperlipidemia    Hypertension    Mitral stenosis    NSTEMI (non-ST elevated myocardial infarction) [facility])    Past Surgical History:  Past Surgical History:  Procedure Laterality Date   BIOPSY  08/01/2020   Procedure: BIOPSY;  Surgeon: Ronnette Juniper, MD;  Location: [facility];  Service: Gastroenterology;;  EGD and COLON   CHOLECYSTECTOMY     COLONOSCOPY N/A 08/01/2020   Procedure: COLONOSCOPY;  Surgeon: Ronnette Juniper, MD;  Location: [facility] ENDOSCOPY;  Service: Gastroenterology;  Laterality: N/A;   CYSTECTOMY     ESOPHAGOGASTRODUODENOSCOPY (EGD) WITH PROPOFOL N/A 08/01/2020   Procedure: ESOPHAGOGASTRODUODENOSCOPY (EGD) WITH PROPOFOL;  Surgeon: Ronnette Juniper, MD;  Location: [facility] ENDOSCOPY;  Service: Gastroenterology;  Laterality: N/A;   POLYPECTOMY  08/01/2020   Procedure: POLYPECTOMY;  Surgeon: Ronnette Juniper, MD;  Location: [facility] ENDOSCOPY;  Service: Gastroenterology;;   TEE WITHOUT CARDIOVERSION N/A 11/13/2020   Procedure: TRANSESOPHAGEAL ECHOCARDIOGRAM (TEE);  Surgeon: Josue Hector, MD;  Location: [facility] ENDOSCOPY;  Service: Cardiovascular;  Laterality: N/A;   TONSILLECTOMY     Social History:  reports that she has never smoked. She has never used smokeless tobacco. No history on file for alcohol use and drug use.  Family / Support Systems Marital Status: Single Patient Roles:  Parent Spouse/Significant Other: N/A Children: 4 adult children- [name], [place], [place], and [place] Other Supports: None reported Anticipated Caregiver: Pt dtr [name] Ability/Limitations of Caregiver: Pt will have support from her son Alvis and dtr [name] who alterante her care needs. Caregiver Availability: 24/7 Family Dynamics: Pt was living with her son Lavone Orn who was performing all home care needs.  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 Education: high school Health Literacy - How often do you need to have someone help you when you read instructions, pamphlets, or other written material from your doctor or pharmacy?: Never Writes: Yes Employment Status: Retired Age Retired: 62 Public relations account executive Issues: Denies Guardian/Conservator: N/A   Abuse/Neglect Abuse/Neglect Assessment Can Be Completed: Yes Physical Abuse: Denies Verbal Abuse: Denies Sexual Abuse: Denies Exploitation of patient/patient's resources: Denies Self-Neglect: Denies  Patient response to: Social Isolation - How often do you feel lonely or isolated from those around you?: Never  Emotional Status Pt's affect, behavior and adjustment status: Pt in good spirits at time of visit, and pt expressed she was tired. Patient was able express some thoughts, however, has aphasia and not able to communicate as well as she would like. her dtr [name] would help assist pt in completing her thoughts. Recent Psychosocial Issues: Denies Psychiatric History: pt dtr [name] reports pt has a hx of anxiety after heart attack in 2021 and meds are presribed by PCP. Substance Abuse History: Denies  Patient / Family  Perceptions, Expectations & Goals Pt/Family understanding of illness & functional limitations: Pt family has general understanding of pt care  needs. Premorbid pt/family roles/activities: Pt was able to perform self-care. Her son performed all homecare needs Anticipated changes in roles/activities/participation: Assistance with ADLs/IADLs  Community Resources Express Scripts: None Premorbid Home Care/DME Agencies: None Transportation available at discharge: TBD Is the patient able to respond to transportation needs?: Yes In the past 12 months, has lack of transportation kept you from medical appointments or from getting medications?: No In the past 12 months, has lack of transportation kept you from meetings, work, or from getting things needed for daily living?: No Resource referrals recommended: Neuropsychology  Discharge Planning Living Arrangements: Children, Other relatives Support Systems: Children, Other relatives Type of Residence: Private residence Insurance Resources: [employer], [place] (specify county) [facility]) Financial Screen Referred: No Living Expenses: Medical laboratory scientific officer Management: Family Does the patient have any problems obtaining your medications?: No Home Management: Pt son perfomed all home care needs Patient/Family Preliminary Plans: No changes Care Coordinator Anticipated Follow Up Needs: HH/OP  Clinical Impression  SW met with pt, pt dr, and granddaughter in room to introduce self, explain role, and discuss discharge process. Pt is not a English as a second language teacher. No HCPOA. DME: shower chair, 3in1 BSC, cane (does not use) and access to Rollator.   Sanjit Mcmichael A Sherrita Riederer 07/20/2021, 2:50 PM

## 2021-07-20 NOTE — Care Management (Signed)
Inpatient [facility] Individual Statement of Services  Patient Name:  Denise Page  Date:  07/20/2021  Welcome to the [facility].  Our goal is to provide you with an individualized program based on your diagnosis and situation, designed to meet your specific needs.  With this comprehensive rehabilitation program, you will be expected to participate in at least 3 hours of rehabilitation therapies Monday-Friday, with modified therapy programming on the weekends.  Your rehabilitation program will include the following services:  Physical Therapy (PT), Occupational Therapy (OT), Speech Therapy (ST), 24 hour per day rehabilitation nursing, Therapeutic Recreaction (TR), Psychology, Neuropsychology, Care Coordinator, Rehabilitation Medicine, [place], and Other  Weekly team conferences will be held on Tuesdays to discuss your progress.  Your Inpatient Rehabilitation Care Coordinator will talk with you frequently to get your input and to update you on team discussions.  Team conferences with you and your family in attendance may also be held.  Expected length of stay: 12-18 days    Overall anticipated outcome: Contact Guard  Depending on your progress and recovery, your program may change. Your Inpatient Rehabilitation Care Coordinator will coordinate services and will keep you informed of any changes. Your Inpatient Rehabilitation Care Coordinator's name and contact numbers are listed  below.  The following services may also be recommended but are not provided by the [facility] will be made to provide these services after discharge if needed.  Arrangements include referral to agencies that provide these services.  Your insurance has been verified to be:  Medicare A/B  Your primary  doctor is:  Clemmie Krill  Pertinent information will be shared with your doctor and your insurance company.  Inpatient Rehabilitation Care Coordinator:  Cathleen Corti [phone] or ([phone]  Information discussed with and copy given to patient by: Rana Snare, 07/20/2021, 10:41 AM

## 2021-07-20 NOTE — IPOC Note (Signed)
Overall Plan of Care [facility]) Patient Details Name: Denise Page MRN: [id number] DOB: 1940-06-01  Admitting Diagnosis: SDH (subdural hematoma)  Hospital Problems: Principal Problem:   SDH (subdural hematoma) Active Problems:   Hypokalemia   Dysphagia, post-stroke   Chronic diastolic congestive heart failure (HCC)   Essential hypertension   Hemiparesis affecting left side as late effect of stroke ([place])     Functional Problem List: Nursing Bladder, Bowel, Endurance, Nutrition, Safety, Skin Integrity  PT Balance, Behavior, Edema, Endurance, Motor, Nutrition, Pain, Perception, Safety, Sensory, Skin Integrity  OT Behavior, Balance, Cognition, Endurance, Motor, Pain, Perception, Safety, Sensory, Vision, Skin Integrity  SLP Cognition, Linguistic, Motor, Nutrition  TR         Basic ADLs: OT Eating, Grooming, Bathing, Dressing, Toileting     Advanced  ADLs: OT       Transfers: PT Bed Mobility, Bed to Chair, Car, Manufacturing systems engineer, Metallurgist: PT Ambulation, Emergency planning/management officer, Stairs     Additional Impairments: OT Fuctional Use of Upper Extremity  SLP Swallowing, Communication, Social Cognition   Awareness, Attention  TR      Anticipated Outcomes Item Anticipated Outcome  Self Feeding S  Swallowing  Supervision   Basic self-care  S  Toileting  S   Bathroom Transfers S  Bowel/Bladder  supervision  Transfers  supervision  Locomotion  CGA  Communication  Min A  Cognition  Min A  Pain  n/a  Safety/Judgment  supervision   Therapy Plan: PT Intensity: Minimum of 1-2 x/day ,45 to 90 minutes PT Frequency: 5 out of 7 days PT Duration Estimated Length of Stay: 16-18 days OT Intensity: Minimum of 1-2 x/day, 45 to 90 minutes OT Frequency: 5 out of 7 days OT Duration/Estimated Length of Stay: 12-16 SLP Intensity: Minumum of 1-2 x/day, 30 to 90 minutes SLP Frequency: 3 to 5 out of 7 days SLP Duration/Estimated Length of Stay: 12-16 days    Due to the current state of emergency, patients may not be receiving their 3-hours of Medicare-mandated therapy.   Team Interventions: Nursing Interventions Patient/Family Education, Bladder Management, Bowel Management, Discharge Planning, Psychosocial Support, Skin Care/Wound Management  PT interventions Discharge planning, Ambulation/gait training, Functional mobility training, Psychosocial support, Therapeutic Activities, Visual/perceptual remediation/compensation, Balance/vestibular training, Disease management/prevention, Neuromuscular re-education, Skin care/wound management, Therapeutic Exercise, Wheelchair propulsion/positioning, UE/LE Strength taining/ROM, Splinting/orthotics, Pain management, DME/adaptive equipment instruction, Cognitive remediation/compensation, Community reintegration, Functional electrical stimulation, Patient/family education, IT trainer, UE/LE Coordination activities  OT Interventions Training and development officer, Discharge planning, Functional electrical stimulation, Pain management, Self Care/advanced ADL retraining, Therapeutic Activities, UE/LE Coordination activities, Cognitive remediation/compensation, Disease mangement/prevention, Functional mobility training, Patient/family education, Skin care/wound managment, Therapeutic Exercise, Visual/perceptual remediation/compensation, Wheelchair propulsion/positioning, UE/LE Strength taining/ROM, Splinting/orthotics, Psychosocial support, Neuromuscular re-education, DME/adaptive equipment instruction, Community reintegration  SLP Interventions Cognitive remediation/compensation, English as a second language teacher, Internal/external aids, Patient/family education, Speech/Language facilitation, Environmental controls, Dysphagia/aspiration precaution training  TR Interventions    SW/CM Interventions Discharge Planning, Psychosocial Support, Patient/Family Education   Barriers to Discharge MD  Medical stability  Nursing Decreased  caregiver support, Home environment access/layout, Incontinence, Lack of/limited family support, Wound Care 1 level, 4 steps, right rail. Son can provide min assist at discharge.  PT Inaccessible home environment, Decreased caregiver support, Home environment access/layout, Lack of/limited family support, Insurance for SNF coverage, Behavior    OT Decreased caregiver support    SLP      SW       Team Discharge Planning: Destination: PT-Home ,OT- Home , SLP-Home Projected Follow-up: PT- ,  OT-  Home health OT, SLP-Home Health SLP Projected Equipment Needs: PT-To be determined, OT- 3 in 1 bedside comode, Tub/shower bench, To be determined, SLP-None recommended by SLP Equipment Details: PT- , OT-  Patient/family involved in discharge planning: PT- Patient,  OT-Patient unable/family or caregiver not available, SLP-Family member/caregiver  MD ELOS: 13-16 days Medical Rehab Prognosis:  Excellent Assessment: The patient has been admitted for CIR therapies with the diagnosis of left SDH with right hemiparesis and aphasia/apraxia. The team will be addressing functional mobility, strength, stamina, balance, safety, adaptive techniques and equipment, self-care, bowel and bladder mgt, patient and caregiver education, NMR, cognition, language, community reentry. Goals have been set at supervision for mobility and self-care and min assist for communication and cognition.   Due to the current state of emergency, patients may not be receiving their 3 hours per day of Medicare-mandated therapy.    Meredith Staggers, MD, FAAPMR     See Team Conference Notes for weekly updates to the plan of care

## 2021-07-20 NOTE — Progress Notes (Signed)
Inpatient Rehabilitation  Patient information reviewed and entered into eRehab system by Raedyn Wenke Mcgregor Tinnon, OTR/L.   Information including medical coding, functional ability and quality indicators will be reviewed and updated through discharge.    

## 2021-07-20 NOTE — Progress Notes (Signed)
Speech Language Pathology TBI Note  Patient Details  Name: Denise Page MRN: [id number] Date of Birth: 1940/06/14  Today's Date: 07/20/2021 SLP Individual Time: 1300-1355 SLP Individual Time Calculation (min): 55 min  Short Term Goals: Week 1: SLP Short Term Goal 1 (Week 1): Pt will consume dys 3 textures and thin liquids with supervision cues for use of swallowing precautions and minimal overt s/s of aspiration. SLP Short Term Goal 2 (Week 1): Pt will recognize and correct verbal errors at the short phrase level with mod assist multimodal cues. SLP Short Term Goal 3 (Week 1): Pt will copy words from a written model during structured tasks with mod assist multimodal cues to correct written errors. SLP Short Term Goal 4 (Week 1): Pt will convey immediate needs and wants to caregivers at the phrase level with mod assist multimodal cues.  Skilled Therapeutic Interventions: Skilled treatment session focused on cognitive-linguistic goals. Upon arrival, patient was awake while upright in the wheelchair. SLP facilitated session by providing extra time and overall Mod verbal and visual cues to generate names of her children and grandchildren. Throughout task, patient appeared paranoid regarding why SLP was asking questions about her family. SLP provided education regarding talking about meaningful information. Patient remained somewhat upset throughout the session despite SLP attempts to redirect conversation to other topics. Patient continued to report that the SLP was "drilling her" when asked if she would like a calendar to keep up with the date. Upon leaving, SLP tightened patient's chair alarm as it was extremely loose. Patient left upright in wheelchair with alarm on and all needs within reach. Continue with current plan of care.   Of note, discussed the events from today's events with the patient's daughter, [name]. [name] reported mild paranoia at baseline that now appears exacerbated due to her  current brain injury.      Pain Pain Assessment Pain Scale: 0-10 Pain Score: 0-No pain  Agitated Behavior Scale: TBI Observation Details Observation Environment: pt's room, ortho gym Start of observation period - Date: 07/20/21 Start of observation period - Time: 1030 End of observation period - Date: 07/20/21 End of observation period - Time: 1115 Agitated Behavior Scale (DO NOT LEAVE BLANKS) Short attention span, easy distractibility, inability to concentrate: Present to a moderate degree Impulsive, impatient, low tolerance for pain or frustration: Present to a slight degree Uncooperative, resistant to care, demanding: Absent Violent and/or threatening violence toward people or property: Absent Explosive and/or unpredictable anger: Absent Rocking, rubbing, moaning, or other self-stimulating behavior: Absent Pulling at tubes, restraints, etc.: Absent Wandering from treatment areas: Absent Restlessness, pacing, excessive movement: Absent Repetitive behaviors, motor, and/or verbal: Absent Rapid, loud, or excessive talking: Absent Sudden changes of mood: Absent Easily initiated or excessive crying and/or laughter: Absent Self-abusiveness, physical and/or verbal: Absent Agitated behavior scale total score: 17  Therapy/Group: Individual Therapy  Laquenta Whitsell 07/20/2021, 3:11 PM

## 2021-07-20 NOTE — Plan of Care (Signed)
°  Problem: RH Balance Goal: LTG Patient will maintain dynamic sitting balance (PT) Description: LTG:  Patient will maintain dynamic sitting balance with assistance during mobility activities (PT) Flowsheets (Taken 07/18/2021 0554) LTG: Pt will maintain dynamic sitting balance during mobility activities with:: Independent with assistive device  Goal: LTG Patient will maintain dynamic standing balance (PT) Description: LTG:  Patient will maintain dynamic standing balance with assistance during mobility activities (PT) Flowsheets (Taken 07/18/2021 0554) LTG: Pt will maintain dynamic standing balance during mobility activities with:: Contact Guard/Touching assist   Problem: Sit to Stand Goal: LTG:  Patient will perform sit to stand with assistance level (PT) Description: LTG:  Patient will perform sit to stand with assistance level (PT) Flowsheets (Taken 07/18/2021 0554) LTG: PT will perform sit to stand in preparation for functional mobility with assistance level: Supervision/Verbal cueing   Problem: RH Bed Mobility Goal: LTG Patient will perform bed mobility with assist (PT) Description: LTG: Patient will perform bed mobility with assistance, with/without cues (PT). Flowsheets (Taken 07/18/2021 0554) LTG: Pt will perform bed mobility with assistance level of: Supervision/Verbal cueing   Problem: RH Bed to Chair Transfers Goal: LTG Patient will perform bed/chair transfers w/assist (PT) Description: LTG: Patient will perform bed to chair transfers with assistance (PT). Flowsheets (Taken 07/18/2021 0554) LTG: Pt will perform Bed to Chair Transfers with assistance level: Contact Guard/Touching assist   Problem: RH Car Transfers Goal: LTG Patient will perform car transfers with assist (PT) Description: LTG: Patient will perform car transfers with assistance (PT). Flowsheets (Taken 07/18/2021 0554) LTG: Pt will perform car transfers with assist:: Contact Guard/Touching assist   Problem: RH  Furniture Transfers Goal: LTG Patient will perform furniture transfers w/assist (OT/PT) Description: LTG: Patient will perform furniture transfers  with assistance (OT/PT). Flowsheets (Taken 07/18/2021 0554) LTG: Pt will perform furniture transfers with assist:: Contact Guard/Touching assist   Problem: RH Ambulation Goal: LTG Patient will ambulate in controlled environment (PT) Description: LTG: Patient will ambulate in a controlled environment, # of feet with assistance (PT). Flowsheets (Taken 07/18/2021 0554) LTG: Pt will ambulate in controlled environ  assist needed:: Contact Guard/Touching assist LTG: Ambulation distance in controlled environment: 150 feet using LRAD Goal: LTG Patient will ambulate in home environment (PT) Description: LTG: Patient will ambulate in home environment, # of feet with assistance (PT). Flowsheets (Taken 07/18/2021 0554) LTG: Pt will ambulate in home environ  assist needed:: Contact Guard/Touching assist LTG: Ambulation distance in home environment: at least 50 feet using LRAD   Problem: RH Stairs Goal: LTG Patient will ambulate up and down stairs w/assist (PT) Description: LTG: Patient will ambulate up and down # of stairs with assistance (PT) Flowsheets (Taken 07/18/2021 1556) LTG: Pt will ambulate up/down stairs assist needed:: Contact Guard/Touching assist LTG: Pt will  ambulate up and down number of stairs: at least 6 steps with HR setup as per home environment

## 2021-07-20 NOTE — Progress Notes (Signed)
Occupational Therapy TBI Note  Patient Details  Name: Denise Page MRN: [id number] Date of Birth: 1940/10/24  Today's Date: 07/20/2021 OT Individual Time: 4268-3419 OT Individual Time Calculation (min): 45 min    Short Term Goals: Week 1:  OT Short Term Goal 1 (Week 1): Pt will groom in standing with CGA to demo improved standing tolerance OT Short Term Goal 2 (Week 1): Pt will transfer to toilet wiht CGA and LRAD OT Short Term Goal 3 (Week 1): Pt will bathe wiht no more than min cuing for sequencing OT Short Term Goal 4 (Week 1): Pt will scan to R for 5/5 ADL items with no cuing to demo imroved R attention  Skilled Therapeutic Interventions/Progress Updates:    Pt received sitting in wc with daughter present.  While discussing with pt therapy plans for this session and getting a sense of pt's awareness and insight, pt was highly distracted by her daughter when she had to answer a phone call and then when housekeeping opened door to get the trash.  Pt did agree to try to toilet before going to therapy.  Completed stand pivot wc to toilet with min and then used the RW to ambulate out of bathroom to wc with min .  Decreased R side awareness when moving to sit in chair with mod cues for positioning.  CGA for balance with toileting.   Pt transported to ortho gym as it was quiet.  In gym, pt able to focus more easily.  To work on [employer] and strength, pt used arm bike for 5 min (with stopping every 1 min, for short break),  dowel bar for overhead reaches with mod cues.  Tried using clothes pins but blue resistive pins too difficult for her to pinch with R hand.  Able to do with L (pt is left handed).    Able to identify colors easily.  Pt may have some decreased sensory awareness along with perceptual awareness of R hand.   Hand off to PT for her next session.  Therapy Documentation Precautions:  Precautions Precautions: Fall, Other (comment) Precaution Comments: expressive  aphasia, cognitive deficits Restrictions Weight Bearing Restrictions: No   Pain: Pain Assessment Pain Scale: 0-10 Pain Score: 0-No pain Agitated Behavior Scale: TBI Observation Details Observation Environment: pt's room, ortho gym Start of observation period - Date: 07/20/21 Start of observation period - Time: 1030 End of observation period - Date: 07/20/21 End of observation period - Time: 1115 Agitated Behavior Scale (DO NOT LEAVE BLANKS) Short attention span, easy distractibility, inability to concentrate: Present to a moderate degree Impulsive, impatient, low tolerance for pain or frustration: Present to a slight degree Uncooperative, resistant to care, demanding: Absent Violent and/or threatening violence toward people or property: Absent Explosive and/or unpredictable anger: Absent Rocking, rubbing, moaning, or other self-stimulating behavior: Absent Pulling at tubes, restraints, etc.: Absent Wandering from treatment areas: Absent Restlessness, pacing, excessive movement: Absent Repetitive behaviors, motor, and/or verbal: Absent Rapid, loud, or excessive talking: Absent Sudden changes of mood: Absent Easily initiated or excessive crying and/or laughter: Absent Self-abusiveness, physical and/or verbal: Absent Agitated behavior scale total score: 17  ADL: ADL Grooming: Moderate cueing, Minimal assistance Where Assessed-Grooming: Standing at sink Upper Body Bathing: Supervision/safety Where Assessed-Upper Body Bathing: Sitting at sink Lower Body Bathing: Moderate assistance Where Assessed-Lower Body Bathing: Sitting at sink, Standing at sink Upper Body Dressing: Moderate assistance Where Assessed-Upper Body Dressing: Sitting at sink Lower Body Dressing: Maximal assistance Where Assessed-Lower Body Dressing: Sitting at sink, Standing  at sink Toileting: Maximal assistance Where Assessed-Toileting: Glass blower/designer: Moderate assistance Toilet Transfer Method:  Stand pivot Toilet Transfer Equipment: Grab bars   Therapy/Group: Individual Therapy  [place] 07/20/2021, 1:20 PM

## 2021-07-20 NOTE — Progress Notes (Signed)
Physical Therapy Session Note  Patient Details  Name: Denise Page MRN: [id number] Date of Birth: 1940-10-25  Today's Date: 07/20/2021 PT Individual Time: 0800-0828 PT Individual Time Calculation (min): 28 min   Short Term Goals: Week 1:     Skilled Therapeutic Interventions/Progress Updates:    pTinitially supine, awake, alert, aphasic. Supine to sit w/supervision and use of rail. In sitting, pt able to doff nightgown, don bra w/assist to manage clasp only and additional time. Dons shirt , pants to thighs, and socks w/set up and additional time. Dons shoes w/mod assist, ties w/additional time. Sit to stand from bed w/min assist to RW, raises pants w/min assist for balance. stand pivot transfer to wc w/min assist and RW Pt set up w/breakfast at end of session and handed off to nursing as no alarm belt equipment avail in room.   Therapy Documentation Precautions:  Precautions Precautions: Fall, Other (comment) Precaution Comments: expressive aphasia, cognitive deficits Restrictions Weight Bearing Restrictions: No   Therapy/Group: Individual Therapy Callie Fielding, [place] 07/20/2021, 9:23 AM

## 2021-07-20 NOTE — Progress Notes (Signed)
Physical Therapy Session Note  Patient Details  Name: Denise Page MRN: [id number] Date of Birth: 07/05/1940  Today's Date: 07/20/2021 PT Individual Time: 1118-1200 PT Individual Time Calculation (min): 42 min   Short Term Goals: Week 1:     Skilled Therapeutic Interventions/Progress Updates:     Pt handed off to PT from OT at [employer] station. No complaint of pain. WC transport to gym for time management. Pt performs sit to stand with minA/modA and cues for hand placement and body mechanics. Pt ambulates x100' with RW and primarily minA but with LOB to the R requiring modA/maxA to prevent fall. Cues to maintain upright gaze to improve posture and balance, and RW management when transitioning back to WC. Pt performs Nustep for strength and endurance training. Pt completes total of 10:00 with 4-5 seated rest breaks. PT cues pt to maintain rate >40 steps per minute at workload of 4 and tends to keep rate close to 30. Following seated rest break, pt ambulates additional x50' with minA and same cueing. WC transport back to room. Left seated with alarm intact and all neds within reach.  Therapy Documentation Precautions:  Precautions Precautions: Fall, Other (comment) Precaution Comments: expressive aphasia, cognitive deficits Restrictions Weight Bearing Restrictions: No   Therapy/Group: Individual Therapy  Breck Coons, PT, DPT 07/20/2021, 12:25 PM

## 2021-07-21 MED ORDER — LIDOCAINE 4 % EX CREA
TOPICAL_CREAM | Freq: Once | CUTANEOUS | Status: AC
  Filled 2021-07-21: qty 5

## 2021-07-21 NOTE — Patient Care Conference (Signed)
Inpatient RehabilitationTeam Conference and Plan of Care Update Date: 07/21/2021   Time: 10:33 AM    Patient Name: Denise Page      Medical Record Number: [id number]  Date of Birth: 06-Jan-1941 Sex: Female         Room/Bed: 4W16C/4W16C-01 Payor Info: Payor: MEDICARE / Plan: MEDICARE PART A AND B / Product Type: *No Product type* /    Admit Date/Time:  07/17/2021  4:02 PM  Primary Diagnosis:  SDH (subdural hematoma)  Hospital Problems: Principal Problem:   SDH (subdural hematoma) Active Problems:   Hypokalemia   Dysphagia, post-stroke   Chronic diastolic congestive heart failure (HCC)   Essential hypertension   Hemiparesis affecting left side as late effect of stroke [facility])    Expected Discharge Date: Expected Discharge Date: 07/25/21  Team Members Present: Physician leading conference: Dr. Alger Simons Social Worker Present: Loralee Pacas, [place] Nurse Present: Dorthula Nettles, RN PT Present: Tereasa Coop, PT OT Present: Willeen Cass, OT PPS Coordinator present : Gunnar Fusi, SLP     Current Status/Progress Goal Weekly Team Focus  Bowel/Bladder   continent b/b  remain continent  toilet as needed   Swallow/Nutrition/ Hydration   Dys. 3 textures with thin liquids, Min A  Supervision  tolerance of current diet, trials of upgraded textures   ADL's   contact guard.overall; using RW for transtional mobilty [place]  supervision  fam education, endurance, orientation, attention and awareness   Mobility   minA sit to stand, minA gait x100' with RW  CGA  Fam education, orientation, endurance, ambulation, balance   Communication   Mod-Max A  Min A  auditory comprehension of basic information, word-finding, awareness of errors   Safety/Cognition/ Behavioral Observations  Mod A  Min A  sustained attention, emergent awareness of errors   Pain   no pain reported  pain < 3  assess pain q 4hr and prn   Skin   incision to head, shingles to buttock  no new  breakdown/infection  assess skin q shift and prn     Discharge Planning:  Discharging home with daughter, can only provide supervision care. Brother will assist with care.   Team Discussion: Admitted from Hospice. Discontinued Keppra and patient woke up. Cognition improving. [employer] discontinued today. Wants to go home. Incontinent bowel. Reports 10/10 pain to back. Shingles resolving. Can be impulsive, tele-sitter ordered, waiting on available. Discharging home with daughter, brother will be assisting with care. Recommending 24/7 supervision. Dementia at baseline, forgetful, paranoid.  Patient on target to meet rehab goals: yes, contact guard to supervision goals Currently at [facility] to supervision with ADL's. Ambulated 100 ft with min assist to CGA. Language is improving. Has expressive aphasia and requires extra time. No straws due to aspiration. Perseverates on make-up and going home.  *See Care Plan and progress notes for long and short-term goals.   Revisions to Treatment Plan:  Adjusting medications.   Teaching Needs: Family education, medication/pain management, skin/wound care, safety awareness, transfer/gait training, etc.  Current Barriers to Discharge: Decreased caregiver support, Home enviroment access/layout, Incontinence, and Lack of/limited family support  Possible Resolutions to Barriers: Family education Time toileting schedule Follow-up PT/OT/SLP     Medical Summary Current Status: large left SDH with edema requiring crani. prolonged hospital course. improved arousal. 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  Barriers to Discharge: Medical stability   Possible Resolutions to [employer] Focus: daily assessment of labs and pt data, pain mgt, wound care.   Continued Need for Acute Rehabilitation Level  of Care: The patient requires daily medical management by a physician with specialized training in physical medicine and rehabilitation for the following  reasons: Direction of a multidisciplinary physical rehabilitation program to maximize functional independence : Yes Medical management of patient stability for increased activity during participation in an intensive rehabilitation regime.: Yes Analysis of laboratory values and/or radiology reports with any subsequent need for medication adjustment and/or medical intervention. : Yes   I attest that I was present, lead the team conference, and concur with the assessment and plan of the team.   Cristi Loron 07/21/2021, 3:27 PM

## 2021-07-21 NOTE — Progress Notes (Signed)
Patient ID: Denise Page, female   DOB: 07-18-1940, 81 y.o.   MRN: [id number]  SW met with pt and pt dtr Denise Page in room to provide updates from team conference, and d/c date 3/4. SW will provide updates on d/c recommendations.   Loralee Pacas, MSW, [place] Office: [phone] Cell: [phone] Fax: [phone]

## 2021-07-21 NOTE — Progress Notes (Signed)
Physical Therapy Session Note  Patient Details  Name: Denise Page MRN: [id number] Date of Birth: 11/13/1940  Today's Date: 07/21/2021 PT Individual Time: 1404-1430 PT Individual Time Calculation (min): 26 min   Short Term Goals: Week 1:     Skilled Therapeutic Interventions/Progress Updates:     Pt received seated in [facility] and agrees to therapy. Reports soreness in both legs. Number not provided. Pt provides rest breaks as needed to manage pain. Pt performs sit to stand with minA and cues for hand placement and sequencing. Pt ambulates x100' with RW and CGA, with cues for upright gaze to improve posture and balance, and increasing gait speed to decrease risk for falls. Seated rest break prior to additional mobility. Pt ambulates x175' with RW and primarily CGA, but with x1 instance requiring minA with slight LOB and pt demonstrating ataxic/uncoordinated movements of both legs. Pt unable to say why this occurred and daughter later confirms that patient had been doing this prior to hospitalization and that such movement cause her fall leading to SDH. Pt ambulates final bout of 100' back to room with CGA. Sit to supine with cues for sequencing. Left supine with alarm intact and all needs within reach.  Therapy Documentation Precautions:  Precautions Precautions: Fall, Other (comment) Precaution Comments: expressive aphasia, cognitive deficits Restrictions Weight Bearing Restrictions: No   Therapy/Group: Individual Therapy  Breck Coons, PT, DPT 07/21/2021, 3:40 PM

## 2021-07-21 NOTE — Progress Notes (Signed)
Occupational Therapy TBI Note  Patient Details  Name: Denise Page MRN: [id number] Date of Birth: Apr 18, 1941  Today's Date: 07/21/2021 OT Individual Time: 1131-1203 OT Individual Time Calculation (min): 32 min    Short Term Goals: Week 1:  OT Short Term Goal 1 (Week 1): Pt will groom in standing with CGA to demo improved standing tolerance OT Short Term Goal 2 (Week 1): Pt will transfer to toilet wiht CGA and LRAD OT Short Term Goal 3 (Week 1): Pt will bathe wiht no more than min cuing for sequencing OT Short Term Goal 4 (Week 1): Pt will scan to R for 5/5 ADL items with no cuing to demo imroved R attention  Skilled Therapeutic Interventions/Progress Updates:     Pt received in w/c with no pain and daughter present Therapuetic activity Pt escorted to courtyard total A for mood support and rapport building as well as outside functional mobility, however pt refusing to walk outside repeatedly stating, "well, im 81 years old and I dont want to." Pt with improved expression since this clinician saw her on eval, however still requires increased time for word finding and processing. Pt agreeable to walking inside in gift shop reaching for items and OT pointing out items on R for R attention, functional reach and visual scanning. Pt often bumping into objects on R with walker and dragging R foot in turns outside RW. Communicated this with family at end of session as fall risk. Pt completes functional mobility with RW in apartment on/off regular bed, couch and recliner to simulate home environment with CGA overall for power up and Vc for RW management   9HPT RUE 1 min 11 sec LUE 44 sec  Pt left at end of session in w/c with exit alarm on daugher in room, call light in reach and all needs met   Therapy Documentation Precautions:  Precautions Precautions: Fall, Other (comment) Precaution Comments: expressive aphasia, cognitive deficits Restrictions Weight Bearing Restrictions:  No General:   Vital Signs:  Pain:   Agitated Behavior Scale: TBI  Observation Details Observation Environment: CIR Start of observation period - Date: 07/21/21 Start of observation period - Time: 0945 End of observation period - Date: 07/21/21 End of observation period - Time: 1100 Agitated Behavior Scale (DO NOT LEAVE BLANKS) Short attention span, easy distractibility, inability to concentrate: Present to a slight degree Impulsive, impatient, low tolerance for pain or frustration: Present to a slight degree Uncooperative, resistant to care, demanding: Present to a slight degree Violent and/or threatening violence toward people or property: Absent Explosive and/or unpredictable anger: Absent Rocking, rubbing, moaning, or other self-stimulating behavior: Absent Pulling at tubes, restraints, etc.: Absent Wandering from treatment areas: Absent Restlessness, pacing, excessive movement: Absent Repetitive behaviors, motor, and/or verbal: Absent Rapid, loud, or excessive talking: Absent Sudden changes of mood: Absent Easily initiated or excessive crying and/or laughter: Absent Self-abusiveness, physical and/or verbal: Absent Agitated behavior scale total score: 17   Therapy/Group: Individual Therapy  [place] 07/21/2021, 12:22 PM

## 2021-07-21 NOTE — Progress Notes (Signed)
Occupational Therapy TBI Note  Patient Details  Name: Denise Page MRN: [id number] Date of Birth: [birth year]  Today's Date: 07/21/2021 OT Individual Time: 0815-0900 OT Individual Time Calculation (min): 45 min    Short Term Goals: Week 1:  OT Short Term Goal 1 (Week 1): Pt will groom in standing with CGA to demo improved standing tolerance OT Short Term Goal 2 (Week 1): Pt will transfer to toilet wiht CGA and LRAD OT Short Term Goal 3 (Week 1): Pt will bathe wiht no more than min cuing for sequencing OT Short Term Goal 4 (Week 1): Pt will scan to R for 5/5 ADL items with no cuing to demo imroved R attention  Skilled Therapeutic Interventions/Progress Updates:    1:1   Pt received in the bed. Pt reporting she didn't know what was going on or where she was. Pt reported she was 35 and almost died. She was aware she had sx on her head. She was optimistic about her progress and reported she wanted to go home. Oriented her to place and situation and date. Pt ambulated to the bathroom and voided BM and urine with RW with supervision. PT able to perform toileting steps with min A for thoroughness. Pt returned to sitting in w/c and was able to dress from a pile of clothes on her right. Pt was able to convey her needs through multimodal approaches when words didn't come out right. (Pt had declined bathing- reported she shower yesterday). Pt washed face and donned fresh makeup with min A for eye makeup. Pt brushed teeth with A to open and close container. Pt wanted therapist to talk to the doctor about her progress to get her home.   Left sitting up in her w/c with safety belt. Pt also checked off therapies already attended.   Therapy Documentation Precautions:  Precautions Precautions: Fall, Other (comment) Precaution Comments: expressive aphasia, cognitive deficits Restrictions Weight Bearing Restrictions: No   Pain: No pain in session  Agitated Behavior Scale: TBI Observation  Details Observation Environment: Pt's room Start of observation period - Date: 07/21/21 Start of observation period - Time: 0730 End of observation period - Date: 07/21/21 End of observation period - Time: 0815 Agitated Behavior Scale (DO NOT LEAVE BLANKS) Short attention span, easy distractibility, inability to concentrate: Present to a slight degree Impulsive, impatient, low tolerance for pain or frustration: Absent Uncooperative, resistant to care, demanding: Absent Violent and/or threatening violence toward people or property: Absent Explosive and/or unpredictable anger: Absent Rocking, rubbing, moaning, or other self-stimulating behavior: Absent Pulling at tubes, restraints, etc.: Absent Wandering from treatment areas: Absent Restlessness, pacing, excessive movement: Absent Repetitive behaviors, motor, and/or verbal: Present to a moderate degree Rapid, loud, or excessive talking: Absent Sudden changes of mood: Absent Easily initiated or excessive crying and/or laughter: Absent Self-abusiveness, physical and/or verbal: Absent Agitated behavior scale total score: 17     Therapy/Group:   Nicoletta Ba 07/21/2021, 10:03 AM

## 2021-07-21 NOTE — Progress Notes (Signed)
[facility] PHYSICAL MEDICINE & REHABILITATION PROGRESS NOTE  Subjective/Complaints: Pt is sitting up in bed. Continues to tell me she wants to go home.   ROS: limited due to language/communication   Objective: Vital Signs: Blood pressure (!) 147/73, pulse 86, temperature 98.8 F (37.1 C), resp. rate 18, height 5\' 8"  (1.727 m), weight 72.9 kg, SpO2 97 %. No results found. Recent Labs    07/20/21 0738  WBC 3.6*  HGB 7.9*  HCT 24.3*  PLT 208   Recent Labs    07/20/21 0738  NA 138  K 4.1  CL 104  CO2 25  GLUCOSE 97  BUN 15  CREATININE 1.02*  CALCIUM 8.7*   No intake or output data in the 24 hours ending 07/21/21 0918       Physical Exam: BP (!) 147/73 (BP Location: Left Arm)    Pulse 86    Temp 98.8 F (37.1 C)    Resp 18    Ht 5\' 8"  (1.727 m)    Wt 72.9 kg    SpO2 97%    BMI 24.44 kg/m  Constitutional: No distress . Vital signs reviewed. HEENT: NCAT, EOMI, oral membranes moist Neck: supple Cardiovascular: RRR without murmur. No JVD    Respiratory/Chest: CTA Bilaterally without wheezes or rales. Normal effort    GI/Abdomen: BS +, non-tender, non-distended Ext: no clubbing, cyanosis, or edema Psych: pleasant and cooperative, a little anxious Skin: Warm and dry.  Left crani scar CDI with staples and sutures.Marland Kitchen Psych: Normal mood.  Normal behavior. Musc: No edema in extremities.  No tenderness in extremities. Neuro: Alert and oriented. Word finding deficits, apraxic. Seems to be finding words in context a bit better. No focal CN findings. Mild dysarthria.  Motor: LUE/LE: 4 -/5 with apraxia, RUE/RLE 5/5. No focal sensory deficits.     Assessment/Plan: 1. Functional deficits which require 3+ hours per day of interdisciplinary therapy in a comprehensive inpatient rehab setting. Physiatrist is providing close team supervision and 24 hour management of active medical problems listed below. Physiatrist and rehab team continue to assess barriers to discharge/monitor  patient progress toward functional and medical goals   Care Tool:  Bathing    Body parts bathed by patient: Right arm, Left arm, Chest, Abdomen, Right upper leg, Left upper leg, Face   Body parts bathed by helper: Front perineal area, Buttocks, Right lower leg, Left lower leg     Bathing assist Assist Level: Moderate Assistance - Patient 50 - 74%     Upper Body Dressing/Undressing Upper body dressing   What is the patient wearing?: Bra, Pull over shirt    Upper body assist Assist Level: Moderate Assistance - Patient 50 - 74%    Lower Body Dressing/Undressing Lower body dressing      What is the patient wearing?: Pants, Incontinence brief     Lower body assist Assist for lower body dressing: Maximal Assistance - Patient 25 - 49%     Toileting Toileting    Toileting assist Assist for toileting: Contact Guard/Touching assist     Transfers Chair/bed transfer  Transfers assist     Chair/bed transfer assist level: Moderate Assistance - Patient 50 - 74%     Locomotion Ambulation   Ambulation assist      Assist level: Contact Guard/Touching assist Assistive device: No Device Max distance: 15 ft   Walk 10 feet activity   Assist     Assist level: Minimal Assistance - Patient > 75% Assistive device: Hand held assist, No  Device   Walk 50 feet activity   Assist Walk 50 feet with 2 turns activity did not occur: Safety/medical concerns         Walk 150 feet activity   Assist Walk 150 feet activity did not occur: Safety/medical concerns         Walk 10 feet on uneven surface  activity   Assist Walk 10 feet on uneven surfaces activity did not occur: Safety/medical concerns         Wheelchair     Assist Is the patient using a wheelchair?: Yes Type of Wheelchair: Manual    Wheelchair assist level: Minimal Assistance - Patient > 75% Max wheelchair distance: 150 ft    Wheelchair 50 feet with 2 turns activity    Assist         Assist Level: Minimal Assistance - Patient > 75%   Wheelchair 150 feet activity     Assist      Assist Level: Minimal Assistance - Patient > 75%  BP (!) 147/73 (BP Location: Left Arm)    Pulse 86    Temp 98.8 F (37.1 C)    Resp 18    Ht 5\' 8"  (1.727 m)    Wt 72.9 kg    SpO2 97%    BMI 24.44 kg/m    Medical Problem List and Plan: 1. 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  Antithrombotics: -DVT/anticoagulation:  Mechanical: Sequential compression devices, below knee Bilateral lower extremities             -antiplatelet therapy: N/A 3. Pain Management: Tylenol prn.  4. Mood: LCSW to follow for evaluation and support when appropriate.              -antipsychotic agents: N/A 5. Neuropsych: This patient is not fully capable of making decisions on her own behalf. 6. Skin/Wound Care: dc staples/sutures today  7. Fluids/Electrolytes/Nutrition: Monitor I/OS 8. HTN:  -Added magnesium gluconate 250mg  HS to assist blood pressure control-   -continue coreg -bp's controlled 2/28 9. Chronic diastolic CHF/TVAR 28/36: on Coreg bid.   -no signs of overload.             --added low salt restrictions.  Filed Weights   07/17/21 1653 07/17/21 1700  Weight: 72.9 kg 72.9 kg   10. Herpes Zoster: valtrex ends today  -dc airborne precautions 11. H/o depression/anxiety: Continue Lexapro and buspar to manage symptoms. --sleep/wake reasonable at present   12. H/o PUD/acute on chronic anemia:   Hemoglobin 8.7 on 2/18---> down to 7.9 2/27---no signs of gross blood   -recheck tomorrow.   -protonix 13. Impaired fasting glucose: will continue to monitor FBS with routine labs.  14. Delirium: continue safety measures with tele monitor. Discussed benefits of telesitter with daugghter.  15.  Abdominal pain: Urinary retention? Improved  -appears to be voiding well. 16.   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

## 2021-07-21 NOTE — Progress Notes (Signed)
Speech Language Pathology TBI Note  Patient Details  Name: Denise Page MRN: [id number] Date of Birth: 1941-05-10  Today's Date: 07/21/2021 SLP Individual Time: 3790-2409 SLP Individual Time Calculation (min): 45 min  Short Term Goals: Week 1: SLP Short Term Goal 1 (Week 1): Pt will consume dys 3 textures and thin liquids with supervision cues for use of swallowing precautions and minimal overt s/s of aspiration. SLP Short Term Goal 2 (Week 1): Pt will recognize and correct verbal errors at the short phrase level with mod assist multimodal cues. SLP Short Term Goal 3 (Week 1): Pt will copy words from a written model during structured tasks with mod assist multimodal cues to correct written errors. SLP Short Term Goal 4 (Week 1): Pt will convey immediate needs and wants to caregivers at the phrase level with mod assist multimodal cues.  Skilled Therapeutic Interventions: Pt seen this date for skilled ST intervention targeting dysphagia management and speech and language goals outlined above. Upon arrival, pt alert/awake and sitting upright in bed. RN present and providing crushed medications via puree.   SLP facilitated today's session by providing Mod verbal and physical assistance for adherence to swallowing precautions to include small + single bites/sips and alternating bites/sips With use of straw, pt exhibited prolonged coughing episode with intake of thin liquid. No clinical s/sx concerning for aspiration were observed with Dysphagia 3 diet and thin liquid via cup during breakfast. Consumed scrambled eggs and ~ 12 oz of liquid from meal tray. Recommend addition of no straws to aspiration precaution recommendations.   Throughout today's session, pt communicated her wants/needs at the phrase level given extra time and Min-Mod verbal assistance to include use of binary verbal choice prompts and yes/no questions. During basic conversational task, pt corrected verbal errors independently 90%  of the time. Verbally perseverative on going home and finding her make-up, but demonstrated successful redirection with Min A. Re: auditory comprehension, pt benefited from increased vocal amplification from speaker due to suspected decreased hearing acuity and short + simple phrases with accompanying gestures.   Pt was left in the bed with bed alarm on. Call bell and personal belongings within reach. Continue with current plan of care.   Pain Pain Assessment Pain Scale: Faces Faces Pain Scale: Hurts little more Pain Location: Head Pain Descriptors / Indicators: Aching Pain Onset: On-going Pain Intervention(s): Medication (See eMAR)  Agitated Behavior Scale: TBI Observation Details Observation Environment: Pt's room Start of observation period - Date: 07/21/21 Start of observation period - Time: 0730 End of observation period - Date: 07/21/21 End of observation period - Time: 0815 Agitated Behavior Scale (DO NOT LEAVE BLANKS) Short attention span, easy distractibility, inability to concentrate: Present to a slight degree Impulsive, impatient, low tolerance for pain or frustration: Absent Uncooperative, resistant to care, demanding: Absent Violent and/or threatening violence toward people or property: Absent Explosive and/or unpredictable anger: Absent Rocking, rubbing, moaning, or other self-stimulating behavior: Absent Pulling at tubes, restraints, etc.: Absent Wandering from treatment areas: Absent Restlessness, pacing, excessive movement: Absent Repetitive behaviors, motor, and/or verbal: Present to a moderate degree Rapid, loud, or excessive talking: Absent Sudden changes of mood: Absent Easily initiated or excessive crying and/or laughter: Absent Self-abusiveness, physical and/or verbal: Absent Agitated behavior scale total score: 17  Therapy/Group: Individual Therapy  Yasuo Phimmasone A Yesica Kemler 07/21/2021, 9:48 AM

## 2021-07-21 NOTE — Progress Notes (Signed)
Occupational Therapy Session Note  Patient Details  Name: Denise Page MRN: [id number] Date of Birth: 04-03-1941  Today's Date: 07/21/2021 OT Individual Time: 1131-1203 OT Individual Time Calculation (min): 32 min    Short Term Goals: Week 1:  OT Short Term Goal 1 (Week 1): Pt will groom in standing with CGA to demo improved standing tolerance OT Short Term Goal 2 (Week 1): Pt will transfer to toilet wiht CGA and LRAD OT Short Term Goal 3 (Week 1): Pt will bathe wiht no more than min cuing for sequencing OT Short Term Goal 4 (Week 1): Pt will scan to R for 5/5 ADL items with no cuing to demo imroved R attention  Skilled Therapeutic Interventions/Progress Updates:  Patient met seated in wc in agreement with OT treatment session. 0/10 pain reported at rest and with activity. Patient reporting fatigue from earlier sessions requiring increased coaxing/encouragement for participation with therapy efforts. Daughter present at bedside also provided encouragement for participation. Functional mobility ~60-17ft with RW. Total A for wc transport the rest of the way to the dayroom for time management. Patient engaged in peg board activity in standing initially (requested to sit midway throughout activity) with focus on attention, processing, memory and motor planning. Patient required Max visual/verbal cues to repeat pattern seen on picture (difficulty seeing without glasses, glasses not present in hospital room). Session concluded with patient seated in wc with call bell within reach, belt alarm activated and all needs met.    Therapy Documentation Precautions:  Precautions Precautions: Fall, Other (comment) Precaution Comments: expressive aphasia, cognitive deficits Restrictions Weight Bearing Restrictions: No General:    Therapy/Group: Individual Therapy  Denise Page 07/21/2021, 11:34 AM

## 2021-07-21 NOTE — Progress Notes (Signed)
35 staples and 5 sutures removed from scalp. Pt tolerated well.   Gerald Stabs, RN

## 2021-07-22 LAB — CBC
HCT: 24.5 % — ABNORMAL LOW (ref 36.0–46.0)
Hemoglobin: 7.9 g/dL — ABNORMAL LOW (ref 12.0–15.0)
MCH: 28.4 pg (ref 26.0–34.0)
MCHC: 32.2 g/dL (ref 30.0–36.0)
MCV: 88.1 fL (ref 80.0–100.0)
Platelets: 190 10*3/uL (ref 150–400)
RBC: 2.78 MIL/uL — ABNORMAL LOW (ref 3.87–5.11)
RDW: 16.6 % — ABNORMAL HIGH (ref 11.5–15.5)
WBC: 6 10*3/uL (ref 4.0–10.5)
nRBC: 0 % (ref 0.0–0.2)

## 2021-07-22 NOTE — Progress Notes (Signed)
Speech Language Pathology TBI Note ? ?Patient Details  ?Name: Denise Page ?MRN: [id number] ?Date of Birth: [birth year] ? ?Today's Date: 07/22/2021 ?SLP Individual Time: 1015-1100 ?SLP Individual Time Calculation (min): 45 min ? ?Short Term Goals: ?Week 1: SLP Short Term Goal 1 (Week 1): Pt will consume dys 3 textures and thin liquids with supervision cues for use of swallowing precautions and minimal overt s/s of aspiration. ?SLP Short Term Goal 2 (Week 1): Pt will recognize and correct verbal errors at the short phrase level with mod assist multimodal cues. ?SLP Short Term Goal 3 (Week 1): Pt will copy words from a written model during structured tasks with mod assist multimodal cues to correct written errors. ?SLP Short Term Goal 4 (Week 1): Pt will convey immediate needs and wants to caregivers at the phrase level with mod assist multimodal cues. ? ?Skilled Therapeutic Interventions: ?Pt seen this date for skilled ST intervention targeting aforementioned communication goals. Pt alert/awake, and pleasant. OOB in recliner chair, talking to her daughter. Agreeable to ST intervention at bedside. Participated well.  ? ?SLP facilitated today's session by providing caregiver education re: importance of providing extra processing time during conversations with pt, in addition to multimodal cues and choice prompts when communication breakdown occurs; pt's daughter verbalized and demonstrated understanding of these recommendations during informal conversation this session. Overall, pt benefited from [place] A to complete basic confrontation naming task for common objects and Max A for functional divergent naming task. Furthermore, pt benefited from [place] A for communication of wants, needs, thoughts and ideas during  informal conversation with SLP. Continues to exhibit hesitations with initial verbalizations; however, with extra time is able to generate appropriate phrases to closed and open-ended questions. Less verbal  perseveration noted during today's session, in comparison to session on 2/28.  ? ?At the end of the session, pt left in recliner chair with chair alarm activated. Pt's daughter present and OT coming in for next session. Will plan to continue per current ST plan of care next session.  ? ?Pain ?Pt denies pain during today's ST session. ? ?Agitated Behavior Scale: ?TBI ?Observation Details ?Observation Environment: CIR ?Start of observation period - Date: 07/22/21 ?Start of observation period - Time: 1015 ?End of observation period - Date: 07/22/21 ?End of observation period - Time: 1100 ?Agitated Behavior Scale (DO NOT LEAVE BLANKS) ?Short attention span, easy distractibility, inability to concentrate: Absent ?Impulsive, impatient, low tolerance for pain or frustration: Absent ?Uncooperative, resistant to care, demanding: Absent ?Violent and/or threatening violence toward people or property: Absent ?Explosive and/or unpredictable anger: Absent ?Rocking, rubbing, moaning, or other self-stimulating behavior: Absent ?Pulling at tubes, restraints, etc.: Absent ?Wandering from treatment areas: Absent ?Restlessness, pacing, excessive movement: Absent ?Repetitive behaviors, motor, and/or verbal: Absent ?Rapid, loud, or excessive talking: Absent ?Sudden changes of mood: Absent ?Easily initiated or excessive crying and/or laughter: Absent ?Self-abusiveness, physical and/or verbal: Absent ?Agitated behavior scale total score: 14 ? ?Therapy/Group: Individual Therapy ? ?Anquinette Pierro A Joanne Brander ?07/22/2021, 12:38 PM ?

## 2021-07-22 NOTE — Progress Notes (Signed)
Physical Therapy Session Note ? ?Patient Details  ?Name: Denise Page ?MRN: [id number] ?Date of Birth: 10-Aug-1940 ? ?Today's Date: 07/22/2021 ?PT Individual Time: 6226-3335 ?PT Individual Time Calculation (min): 55 min  ? ?Skilled Therapeutic Interventions/Progress Updates:  ?   ?Pt received supine in bed and agrees to therapy. No complaint of pain. Supine to sit with verbal cues for positioning at EOB. Pt performs stand step transfer to [facility] with cues for sequencing and initiation. Pt sits at sink to brush teeth and hair. WC transport to gym for time management. Sit to stand with cues for hand placement. Pt ambulates x150' with RW and CGA, with cues for upright gaze and posture to improve balance, and increasing gait speed to decrease risk for falls. ? ?Pt requests to perform Nustep activity. Completed for strength and endurance training. Pt completes at workload of 3 for total of 12:21 with multiple brief rest breaks and average steps per minute ~30. Pt ambulates additional x150' with RW and CGA. Left seated in WC with alarm intact and all needs within reach. ? ?Therapy Documentation ?Precautions:  ?Precautions ?Precautions: Fall, Other (comment) ?Precaution Comments: expressive aphasia, cognitive deficits ?Restrictions ?Weight Bearing Restrictions: No ? ? ? ?Therapy/Group: Individual Therapy ? ?Breck Coons, PT, DPT ?07/22/2021, 4:11 PM  ?

## 2021-07-22 NOTE — Progress Notes (Signed)
Entered patient's room when bed alarm went off. Found pt's family member transporting her to the bathroom. When told not to, pt's family member was very defiant and insisted that she was allowed to. I spoke to therapy and she has not been authorized. [name] member is completely noncompliant with safety measures.  ? ?Gladstone Lighter, LPN  ?

## 2021-07-22 NOTE — Progress Notes (Signed)
[facility] PHYSICAL MEDICINE & REHABILITATION PROGRESS NOTE ? ?Subjective/Complaints: ?No new issues. Up in bed. Denies pain. Seems to be happy about dc date. Staples and sutures out without any problems ? ?ROS: limited due to language/communication  ? ?Objective: ?Vital Signs: ?Blood pressure (!) 135/51, pulse 83, temperature 99.1 ?F (37.3 ?C), temperature source Oral, resp. rate 16, height 5\' 8"  (1.727 m), weight 72.9 kg, SpO2 93 %. ?No results found. ?Recent Labs  ?  07/20/21 ?0738 07/22/21 ?9147  ?WBC 3.6* 6.0  ?HGB 7.9* 7.9*  ?HCT 24.3* 24.5*  ?PLT 208 190  ? ?Recent Labs  ?  07/20/21 ?0738  ?NA 138  ?K 4.1  ?CL 104  ?CO2 25  ?GLUCOSE 97  ?BUN 15  ?CREATININE 1.02*  ?CALCIUM 8.7*  ? ? ?Intake/Output Summary (Last 24 hours) at 07/22/2021 0842 ?Last data filed at 07/21/2021 1847 ?Gross per 24 hour  ?Intake 480 ml  ?Output --  ?Net 480 ml  ? ?  ? ?  ? ?Physical Exam: ?BP (!) 135/51 (BP Location: Right Arm)   Pulse 83   Temp 99.1 ?F (37.3 ?C) (Oral)   Resp 16   Ht 5\' 8"  (1.727 m)   Wt 72.9 kg   SpO2 93%   BMI 24.44 kg/m?  ?Constitutional: No distress . Vital signs reviewed. ?HEENT: NCAT, EOMI, oral membranes moist ?Neck: supple ?Cardiovascular: RRR without murmur. No JVD    ?Respiratory/Chest: CTA Bilaterally without wheezes or rales. Normal effort    ?GI/Abdomen: BS +, non-tender, non-distended ?Ext: no clubbing, cyanosis, or edema ?Psych: pleasant and cooperative  ?Skin: Warm and dry.  Crani inciison cdi with staples/sutures out ?Psych: Normal mood.  Normal behavior. ?Musc: No edema in extremities.  No tenderness in extremities. ?Neuro: Alert and oriented. Word finding deficits, apraxic. Seems to be finding words in context a bit better. No focal CN findings. Speech clearer  ?Motor: LUE/LE: 4 -/5 with apraxia, RUE/RLE 5/5. Senses in all 4's ?  ? ?Assessment/Plan: ?1. Functional deficits which require 3+ hours per day of interdisciplinary therapy in a comprehensive inpatient rehab setting. ?Physiatrist is  providing close team supervision and 24 hour management of active medical problems listed below. ?Physiatrist and rehab team continue to assess barriers to discharge/monitor patient progress toward functional and medical goals ? ? ?Care Tool: ? ?Bathing ?   ?Body parts bathed by patient: Right arm, Left arm, Chest, Abdomen, Right upper leg, Left upper leg, Face  ? Body parts bathed by helper: Front perineal area, Buttocks, Right lower leg, Left lower leg ?  ?  ?Bathing assist Assist Level: Moderate Assistance - Patient 50 - 74% ?  ?  ?Upper Body Dressing/Undressing ?Upper body dressing   ?What is the patient wearing?: Bra, Pull over shirt ?   ?Upper body assist Assist Level: Moderate Assistance - Patient 50 - 74% ?   ?Lower Body Dressing/Undressing ?Lower body dressing ? ? ?   ?What is the patient wearing?: Pants, Incontinence brief ? ?  ? ?Lower body assist Assist for lower body dressing: Maximal Assistance - Patient 25 - 49% ?   ? ?Toileting ?Toileting    ?Toileting assist Assist for toileting: Contact Guard/Touching assist ?  ?  ?Transfers ?Chair/bed transfer ? ?Transfers assist ?   ? ?Chair/bed transfer assist level: Moderate Assistance - Patient 50 - 74% ?  ?  ?Locomotion ?Ambulation ? ? ?Ambulation assist ? ?   ? ?Assist level: Contact Guard/Touching assist ?Assistive device: No Device ?Max distance: 15 ft  ? ?Walk  10 feet activity ? ? ?Assist ?   ? ?Assist level: Minimal Assistance - Patient > 75% ?Assistive device: Hand held assist, No Device  ? ?Walk 50 feet activity ? ? ?Assist Walk 50 feet with 2 turns activity did not occur: Safety/medical concerns ? ?  ?   ? ? ?Walk 150 feet activity ? ? ?Assist Walk 150 feet activity did not occur: Safety/medical concerns ? ?  ?  ?  ? ?Walk 10 feet on uneven surface  ?activity ? ? ?Assist Walk 10 feet on uneven surfaces activity did not occur: Safety/medical concerns ? ? ?  ?   ? ?Wheelchair ? ? ? ? ?Assist Is the patient using a wheelchair?: Yes ?Type of Wheelchair:  Manual ?  ? ?Wheelchair assist level: Minimal Assistance - Patient > 75% ?Max wheelchair distance: 150 ft  ? ? ?Wheelchair 50 feet with 2 turns activity ? ? ? ?Assist ? ?  ?  ? ? ?Assist Level: Minimal Assistance - Patient > 75%  ? ?Wheelchair 150 feet activity  ? ? ? ?Assist ?   ? ? ?Assist Level: Minimal Assistance - Patient > 75%  ?BP (!) 135/51 (BP Location: Right Arm)   Pulse 83   Temp 99.1 ?F (37.3 ?C) (Oral)   Resp 16   Ht 5\' 8"  (1.727 m)   Wt 72.9 kg   SpO2 93%   BMI 24.44 kg/m?   ? ?Medical Problem List and Plan: ?1. 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 --Continue CIR therapies including PT, OT, and SLP  ?2.  Antithrombotics: ?-DVT/anticoagulation:  Mechanical: Sequential compression devices, below knee Bilateral lower extremities ?            -antiplatelet therapy: N/A ?3. Pain Management: Tylenol prn.  ?4. Mood: LCSW to follow for evaluation and support when appropriate.  ?            -antipsychotic agents: N/A ?5. Neuropsych: This patient is not fully capable of making decisions on her own behalf. ? -telesitter as available ?6. Skin/Wound Care: dc staples/sutures today  ?7. Fluids/Electrolytes/Nutrition: Monitor I/OS ?8. HTN:  ?-Added magnesium gluconate 250mg  HS to assist blood pressure control-   ?-continue coreg ?-bp's controlled 3/1 ?9. Chronic diastolic CHF/TVAR 67/12: on Coreg bid.   ?-no signs of overload. ?            --added low salt restrictions.  ?Filed Weights  ? 07/17/21 1653 07/17/21 1700  ?Weight: 72.9 kg 72.9 kg  ? ?10. Herpes Zoster: valtrex ends today ? -dc airborne precautions ?11. H/o depression/anxiety: Continue Lexapro and buspar to manage symptoms. ?--sleep/wake reasonable at present   ?12. H/o PUD/acute on chronic anemia:  ? Hemoglobin 8.7 on 2/18---> down to 7.9 2/27---no signs of gross blood ? 3/1 hgb remains at 7.9. ?  -continue protonix, observation ?  -recheck cbc friday ?13. Impaired fasting glucose: will  continue to monitor FBS with routine labs.  ?14. Delirium: continue safety measures with tele monitor. Discussed benefits of telesitter with daugghter.  ?15.  Abdominal pain: Urinary retention? Improved ? -appears to be voiding well. ?16.  Hypokalemia ? 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 D3 thins. tolerating ?  ? ? ?LOS: 5 days ?A FACE TO FACE EVALUATION WAS PERFORMED ? ?Meredith Staggers ?07/22/2021, 8:42 AM ? ?

## 2021-07-22 NOTE — Progress Notes (Signed)
Patient ID: Jaylnn Ullery, female   DOB: 03/04/1941, 81 y.o.   MRN: [id number] ? ?SW faxed outpatient PT/OT/SLP referral to [facility] (p:[phone]/[phone]). ? ?Loralee Pacas, MSW, LCSWA ?Office: [phone] ?Cell: [phone] ?Fax: [phone]  ?

## 2021-07-22 NOTE — Progress Notes (Signed)
Occupational Therapy TBI Note ? ?Patient Details  ?Name: Denise Page ?MRN: [id number] ?Date of Birth: 03/11/1941 ? ?Today's Date: 07/22/2021 ?OT Individual Time: 3570-1779 ?OT Individual Time Calculation (min): 45 min  ? ? ?Short Term Goals: ?Week 1:  OT Short Term Goal 1 (Week 1): Pt will groom in standing with CGA to demo improved standing tolerance ?OT Short Term Goal 2 (Week 1): Pt will transfer to toilet wiht CGA and LRAD ?OT Short Term Goal 3 (Week 1): Pt will bathe wiht no more than min cuing for sequencing ?OT Short Term Goal 4 (Week 1): Pt will scan to R for 5/5 ADL items with no cuing to demo imroved R attention ? ?Skilled Therapeutic Interventions/Progress Updates:  ?   ?Pt received in bed with back pain reported and repositioned sitting upright in bed and pt reporting feeling better. ? ?Therapeutic activity ?Pt completes sorting/naming tasks to target functional cognition for food items. Pt often attempting to read labels instead of naming items. Pt able to sort into fruit, vegetable, meat and dairy categories with 75% accuracy. Pt attempts to follow recipe for gathering food items listed, however requires OT to cover up instructions as pt distracted by these word. Pt able to gather all needed items with mod mulit-modal cuing/redirection to list of food items. Pt able ot sort tiles into shapes and then same tiles sorted by colors with 85% accuracy and mod question cuing.  ? ?Pt left at end of session in bed with exit alarm on, call light in reach and all needs met ? ? ?Therapy Documentation ?Precautions:  ?Precautions ?Precautions: Fall, Other (comment) ?Precaution Comments: expressive aphasia, cognitive deficits ?Restrictions ?Weight Bearing Restrictions: No ?General: ?  ?Vital Signs: ?Therapy Vitals ?Temp: 98.9 ?F (37.2 ?C) ?Pulse Rate: 76 ?Resp: 18 ?BP: (!) 131/59 ?Patient Position (if appropriate): Lying ?Oxygen Therapy ?SpO2: 100 % ?O2 Device: Room Air ?Pain: ?  ?Agitated Behavior Scale: ?TBI   ?Observation Details ?Observation Environment: CIR ?Start of observation period - Date: 07/22/21 ?Start of observation period - Time: 1300 ?End of observation period - Date: 07/22/21 ?End of observation period - Time: 1400 ?Agitated Behavior Scale (DO NOT LEAVE BLANKS) ?Short attention span, easy distractibility, inability to concentrate: Absent ?Impulsive, impatient, low tolerance for pain or frustration: Absent ?Uncooperative, resistant to care, demanding: Absent ?Violent and/or threatening violence toward people or property: Absent ?Explosive and/or unpredictable anger: Absent ?Rocking, rubbing, moaning, or other self-stimulating behavior: Absent ?Pulling at tubes, restraints, etc.: Absent ?Wandering from treatment areas: Absent ?Restlessness, pacing, excessive movement: Absent ?Repetitive behaviors, motor, and/or verbal: Absent ?Rapid, loud, or excessive talking: Absent ?Sudden changes of mood: Absent ?Easily initiated or excessive crying and/or laughter: Absent ?Self-abusiveness, physical and/or verbal: Absent ?Agitated behavior scale total score: 14 ? ? ?Therapy/Group: Individual Therapy ? ?Lowella Dell Tashya Alberty ?07/22/2021, 3:07 PM ?

## 2021-07-22 NOTE — Discharge Instructions (Addendum)
?  Inpatient Rehab Discharge Instructions ? ?Huey Romans ?Discharge date and time: 07/24/21  ? ?Activities/Precautions/ Functional Status: ?Activity: no lifting, driving, or strenuous exercise for till cleared by MD ?Diet: regular diet ?Wound Care: none needed ? ? ?Functional status:  ?___ No restrictions     ___ Walk up steps independently ?_X__ 24/7 supervision/assistance   ___ Walk up steps with assistance ?___ Intermittent supervision/assistance  ___ Bathe/dress independently ?___ Walk with walker     _X__ Bathe/dress with assistance ?___ Walk Independently    ___ Shower independently ?___ Walk with assistance    ___ Shower with assistance ?_X__ No alcohol     ___ Return to work/school ________ ? ?Special Instructions: ?Family to provide supervision with all activity and with medication/cognitive tasks. ? ?Medical Equipment/Items Ordered: rolling walker and 3in1 bedside commode ?                                                Agency/Supplier: [employer] [phone] ? ?COMMUNITY REFERRALS UPON DISCHARGE:    ? ?Outpatient: PT      OT     ST     ?            [facility]  Phone:[phone]  ?            Appointment Date/Time:*Please expect follow-up within 7-10 business days to schedule your appointment. If you have not received follow-up, please be sure to contact the site directly.* ? ?My questions have been answered and I understand these instructions. I will adhere to these goals and the provided educational materials after my discharge from the hospital. ? ?Patient/Caregiver Signature _______________________________ Date __________ ? ?Clinician Signature _______________________________________ Date __________ ? ?Please bring this form and your medication list with you to all your follow-up doctor's appointments.   ? ? ? ?

## 2021-07-22 NOTE — Progress Notes (Signed)
Occupational Therapy Session Note ? ?Patient Details  ?Name: Denise Page ?MRN: [id number] ?Date of Birth: 1940/06/10 ? ?Today's Date: 07/22/2021 ?OT Individual Time: 1100-1200 ?OT Individual Time Calculation (min): 60 min  ? ? ?Short Term Goals: ?Week 1:  OT Short Term Goal 1 (Week 1): Pt will groom in standing with CGA to demo improved standing tolerance ?OT Short Term Goal 2 (Week 1): Pt will transfer to toilet wiht CGA and LRAD ?OT Short Term Goal 3 (Week 1): Pt will bathe wiht no more than min cuing for sequencing ?OT Short Term Goal 4 (Week 1): Pt will scan to R for 5/5 ADL items with no cuing to demo imroved R attention ? ?Skilled Therapeutic Interventions/Progress Updates:  ?  1:1 Pt in w/c with family member Denise Page present. Pt participated in ADL retraining at shower level. Pt benefited from [name]'s support to shower and dress in fresh clothes today. Pt ambulated to the shower today with contact guard with RW and demonstrated safe use of the RW. Pt doff clothing and voided with min A in insure through ness after BM. Pt did demonstrate perseveration with washing LEs in shower and needed max cues to move to next body part. Pt dried off and ambulated back out of the bathroom to the sink to dress. Pt able to direct what clothing she wanted on in a correct sequential pattern. Pt donned makeup with setup. Pt was able to don all articles of clothing with supervision. Discussed with pt's daughter her performance with ADL and how to cue her - daughter very receptive. Did A with fixing pt's hair. ? ?Pt was making oral perseverative noises with her mouth throughout session. ? ?Therapy Documentation ?Precautions:  ?Precautions ?Precautions: Fall, Other (comment) ?Precaution Comments: expressive aphasia, cognitive deficits ?Restrictions ?Weight Bearing Restrictions: No ? ?Pain: ? No c/o pain in session  ? ? ?Therapy/Group: Individual Therapy ? ?Nicoletta Ba ?07/22/2021, 2:10 PM ?

## 2021-07-23 NOTE — Progress Notes (Signed)
[facility] PHYSICAL MEDICINE & REHABILITATION PROGRESS NOTE ? ?Subjective/Complaints: ?Pt in bed putting her makeup on. Happy that she's going home on Saturday. No new complaints today. Issues with family/transferring pt alone noted  ? ?ROS: limited due to language/communication  ? ?Objective: ?Vital Signs: ?Blood pressure 115/60, pulse 82, temperature 98.5 ?F (36.9 ?C), temperature source Oral, resp. rate 16, height 5\' 8"  (1.727 m), weight 72.9 kg, SpO2 97 %. ?No results found. ?Recent Labs  ?  07/22/21 ?6834  ?WBC 6.0  ?HGB 7.9*  ?HCT 24.5*  ?PLT 190  ? ?No results for input(s): NA, K, CL, CO2, GLUCOSE, BUN, CREATININE, CALCIUM in the last 72 hours. ? ? ?Intake/Output Summary (Last 24 hours) at 07/23/2021 1110 ?Last data filed at 07/23/2021 0900 ?Gross per 24 hour  ?Intake 600 ml  ?Output --  ?Net 600 ml  ? ?  ? ?  ? ?Physical Exam: ?BP 115/60 (BP Location: Left Arm)   Pulse 82   Temp 98.5 ?F (36.9 ?C) (Oral)   Resp 16   Ht 5\' 8"  (1.727 m)   Wt 72.9 kg   SpO2 97%   BMI 24.44 kg/m?  ?Constitutional: No distress . Vital signs reviewed. ?HEENT: NCAT, EOMI, oral membranes moist ?Neck: supple ?Cardiovascular: RRR without murmur. No JVD    ?Respiratory/Chest: CTA Bilaterally without wheezes or rales. Normal effort    ?GI/Abdomen: BS +, non-tender, non-distended ?Ext: no clubbing, cyanosis, or edema ?Psych: pleasant and cooperative  ?Skin: Warm and dry.  Crani inciison CDI with staples/sutures out ?Psych: Normal mood.  Normal behavior. ?Musc: No edema in extremities.  No tenderness in extremities. ?Neuro: Alert and oriented. Word finding deficits, apraxic. Seems to be finding words in context a bit better. No focal CN findings. Speech clearer  ?Motor: LUE/LE: 4 -/5 with apraxia, RUE/RLE 5/5. Senses in all 4's ?  ? ?Assessment/Plan: ?1. Functional deficits which require 3+ hours per day of interdisciplinary therapy in a comprehensive inpatient rehab setting. ?Physiatrist is providing close team supervision and 24 hour  management of active medical problems listed below. ?Physiatrist and rehab team continue to assess barriers to discharge/monitor patient progress toward functional and medical goals ? ? ?Care Tool: ? ?Bathing ?   ?Body parts bathed by patient: Right arm, Left arm, Chest, Abdomen, Right upper leg, Left upper leg, Face, Front perineal area, Left lower leg, Right lower leg  ? Body parts bathed by helper: Buttocks ?  ?  ?Bathing assist Assist Level: Minimal Assistance - Patient > 75% ?  ?  ?Upper Body Dressing/Undressing ?Upper body dressing   ?What is the patient wearing?: Bra, Pull over shirt ?   ?Upper body assist Assist Level: Set up assist ?   ?Lower Body Dressing/Undressing ?Lower body dressing ? ? ?   ?What is the patient wearing?: Pants, Incontinence brief ? ?  ? ?Lower body assist Assist for lower body dressing: Supervision/Verbal cueing ?   ? ?Toileting ?Toileting    ?Toileting assist Assist for toileting: Moderate Assistance - Patient 50 - 74% ?  ?  ?Transfers ?Chair/bed transfer ? ?Transfers assist ?   ? ?Chair/bed transfer assist level: Contact Guard/Touching assist ?  ?  ?Locomotion ?Ambulation ? ? ?Ambulation assist ? ?   ? ?Assist level: Contact Guard/Touching assist ?Assistive device: No Device ?Max distance: 15 ft  ? ?Walk 10 feet activity ? ? ?Assist ?   ? ?Assist level: Minimal Assistance - Patient > 75% ?Assistive device: Hand held assist, No Device  ? ?Walk 50 feet  activity ? ? ?Assist Walk 50 feet with 2 turns activity did not occur: Safety/medical concerns ? ?  ?   ? ? ?Walk 150 feet activity ? ? ?Assist Walk 150 feet activity did not occur: Safety/medical concerns ? ?  ?  ?  ? ?Walk 10 feet on uneven surface  ?activity ? ? ?Assist Walk 10 feet on uneven surfaces activity did not occur: Safety/medical concerns ? ? ?  ?   ? ?Wheelchair ? ? ? ? ?Assist Is the patient using a wheelchair?: Yes ?Type of Wheelchair: Manual ?  ? ?Wheelchair assist level: Minimal Assistance - Patient > 75% ?Max  wheelchair distance: 150 ft  ? ? ?Wheelchair 50 feet with 2 turns activity ? ? ? ?Assist ? ?  ?  ? ? ?Assist Level: Minimal Assistance - Patient > 75%  ? ?Wheelchair 150 feet activity  ? ? ? ?Assist ?   ? ? ?Assist Level: Minimal Assistance - Patient > 75%  ?BP 115/60 (BP Location: Left Arm)   Pulse 82   Temp 98.5 ?F (36.9 ?C) (Oral)   Resp 16   Ht 5\' 8"  (1.727 m)   Wt 72.9 kg   SpO2 97%   BMI 24.44 kg/m?   ? ?Medical Problem List and Plan: ?1. 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 --Continue CIR therapies including PT, OT, and SLP  ?-ELOS 3/4  ?2.  Antithrombotics: ?-DVT/anticoagulation:  Mechanical: Sequential compression devices, below knee Bilateral lower extremities ?            -antiplatelet therapy: N/A ?3. Pain Management: Tylenol prn.  ?4. Mood: LCSW to follow for evaluation and support when appropriate.  ?            -antipsychotic agents: N/A ?5. Neuropsych: This patient is not fully capable of making decisions on her own behalf. ? -telesitter as available, family ed ?6. Skin/Wound Care: dc staples/sutures today  ?7. Fluids/Electrolytes/Nutrition: Monitor I/OS ?8. HTN:  ?-Added magnesium gluconate 250mg  HS to assist blood pressure control-   ?-continue coreg ?-bp's controlled 3/2 ?9. Chronic diastolic CHF/TVAR 34/28: on Coreg bid.   ?-no signs of overload. ?            --added low salt restrictions.  ?Filed Weights  ? 07/17/21 1653 07/17/21 1700  ?Weight: 72.9 kg 72.9 kg  ? ?10. Herpes Zoster: valtrex ends today ? -off airborne precautions ?11. H/o depression/anxiety: Continue Lexapro and buspar to manage symptoms. ?--sleep/wake reasonable at present   ?12. H/o PUD/acute on chronic anemia:  ? Hemoglobin 8.7 on 2/18---> down to 7.9 2/27---no signs of gross blood ? 3/1 hgb remains at 7.9. ?  -continue protonix, observation ?  -recheck cbc friday ?13. Impaired fasting glucose: will continue to monitor FBS with routine labs.  ?14. Delirium:  continue safety measures with tele monitor. Discussed benefits of telesitter with daugghter.  ?15.  Abdominal pain: Urinary retention? Improved ? -appears to be voiding well. ?16.  Hypokalemia ? 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 D3 thins. Tolerating well ?  ? ? ?LOS: 6 days ?A FACE TO FACE EVALUATION WAS PERFORMED ? ?Meredith Staggers ?07/23/2021, 11:10 AM ? ?

## 2021-07-23 NOTE — Progress Notes (Signed)
Occupational Therapy Session Note ? ?Patient Details  ?Name: Denise Page ?MRN: [id number] ?Date of Birth: August 20, 1940 ? ?Today's Date: 07/23/2021 ?OT Individual Time: 8333-8329 ?OT Individual Time Calculation (min): 30 min  ? ? ?Short Term Goals: ?Week 1:  OT Short Term Goal 1 (Week 1): Pt will groom in standing with CGA to demo improved standing tolerance ?OT Short Term Goal 2 (Week 1): Pt will transfer to toilet wiht CGA and LRAD ?OT Short Term Goal 3 (Week 1): Pt will bathe wiht no more than min cuing for sequencing ?OT Short Term Goal 4 (Week 1): Pt will scan to R for 5/5 ADL items with no cuing to demo imroved R attention ? ? ?Skilled Therapeutic Interventions/Progress Updates:  ?  Pt greeted at time of session bed level resting with daughter present who remained throughout session. Pt already had on clean clothes and 2/2 short OT session, participated in room. Supine > sit Supervision and donned shoes Supervision without having to tie laces. Ambulated bed > bathroom > sink with CGA and RW, transferred to commode same manner. CGA for clothing management and hygiene. Daughter requesting at this time to be checked off for assisting with toileting. Daughter [name] demonstrating that she can don/doff gait belt, manage RW, and cue the pt for safety when walking back from the bathroom and throughout her room. Attempted standing activities at sink level performing 1x10 hip abduction and high knees (difficulty sequencing) but pt too fatigued at this time to continue, seated rest in wheelchair. Call bell in reach all needs met and daughter present. Discussed with primary PT and confirmed family OK for transfers, communicated with nursing as well.  ? ?Therapy Documentation ?Precautions:  ?Precautions ?Precautions: Fall, Other (comment) ?Precaution Comments: expressive aphasia, cognitive deficits ?Restrictions ?Weight Bearing Restrictions: No ? ? ? ?Therapy/Group: Individual Therapy ? ?Viona Gilmore ?07/23/2021, 7:25 AM ?

## 2021-07-23 NOTE — Progress Notes (Signed)
Physical Therapy Discharge Summary ? ?Patient Details  ?Name: Denise Page ?MRN: [id number] ?Date of Birth: [birth year] ? ?Today's Date: 07/24/2021 ?PT Individual Time: 5809-9833 ?PT Individual Time Calculation (min): 44 min  ? ? ?Patient has met 10 of 10 long term goals due to improved activity tolerance, improved balance, improved postural control, increased strength, improved attention, and improved awareness.  Patient to discharge at an ambulatory level Supervision.   Patient's care partner is independent to provide the necessary physical and cognitive assistance at discharge. ? ?Reasons goals not met: n/a ? ?Recommendation:  ?Patient will benefit from ongoing skilled PT services in home health setting to continue to advance safe functional mobility, address ongoing impairments in strength, balance, ambulation, and minimize fall risk. ? ?Equipment: ?RW ? ?Reasons for discharge: treatment goals met and discharge from hospital ? ?Patient/family agrees with progress made and goals achieved: Yes ? ?PT Discharge ?Precautions/Restrictions ?Precautions ?Precautions: Fall;Other (comment) ?Precaution Comments: expressive aphasia, cognitive deficits ?Restrictions ?Weight Bearing Restrictions: No ?Pain ?Pain Assessment ?Pain Scale: 0-10 ?Pain Score: 0-No pain ?Pain Interference ?Pain Interference ?Pain Effect on Sleep: 1. Rarely or not at all;2. Occasionally ?Pain Interference with Therapy Activities: 1. Rarely or not at all ?Pain Interference with Day-to-Day Activities: 1. Rarely or not at all ?Vision/Perception  ?Perception ?Perception: Impaired ?Inattention/Neglect: Does not attend to right visual field;Does not attend to right side of body (Much improved from eval) ?Praxis ?Praxis: Impaired ?Praxis Impairment Details: Motor planning;Initiation ?Praxis-Other Comments: Much improved from eval  ?Cognition ?Overall Cognitive Status: Impaired/Different from baseline ?Arousal/Alertness: Awake/alert ?Orientation Level: Oriented  to person;Oriented to place ?Problem Solving: Impaired ?Safety/Judgment: Impaired ?Comments: impulsive ?Sensation ?Light Touch: Appears Intact ?Coordination ?Gross Motor Movements are Fluid and Coordinated: No ?Fine Motor Movements are Fluid and Coordinated: No ?Heel Shin Test: WFL with time ?Motor  ?Motor ?Motor: Other (comment) ?Motor - Skilled Clinical Observations: mild R hemi but much imrpoved from eval  ?Mobility ?Bed Mobility ?Bed Mobility: Supine to Sit;Sit to Supine ?Supine to Sit: Supervision/Verbal cueing ?Sit to Supine: Supervision/Verbal cueing ?Transfers ?Transfers: Sit to Stand;Stand to [employer] Transfers ?Sit to Stand: Supervision/Verbal cueing ?Stand to Sit: Supervision/Verbal cueing ?Stand Pivot Transfers: Supervision/Verbal cueing ?Stand Pivot Transfer Details: Verbal cues for safe use of DME/AE;Verbal cues for technique;Verbal cues for sequencing ?Transfer (Assistive device): Rolling walker ?Locomotion  ?Gait ?Ambulation: Yes ?Gait Assistance: Contact Guard/Touching assist ?Gait Distance (Feet): 175 Feet ?Assistive device: Rolling walker ?Gait Assistance Details: Verbal cues for safe use of DME/AE;Verbal cues for precautions/safety;Verbal cues for technique ?Gait ?Gait: Yes ?Gait Pattern: Impaired ?Gait Pattern: Trunk flexed;Decreased stride length ?Gait velocity: decreased ?Stairs / Additional Locomotion ?Stairs: Yes ?Stairs Assistance: Contact Guard/Touching assist ?Stair Management Technique: Two rails ?Number of Stairs: 8 ?Height of Stairs: 6 ?Ramp: Contact Guard/touching assist ?Curb: Contact Guard/Touching assist ?Wheelchair Mobility ?Wheelchair Mobility: No  ?Trunk/Postural Assessment  ?Cervical Assessment ?Cervical Assessment:  (forward head) ?Thoracic Assessment ?Thoracic Assessment:  (rounded shoulders) ?Lumbar Assessment ?Lumbar Assessment:  (posterior pelvic tilt) ?Postural Control ?Postural Control: Within Functional Limits  ?Balance ?Balance ?Balance Assessed: Yes ?Berg  Balance Test ?Sit to Stand: Able to stand  independently using hands ?Standing Unsupported: Able to stand 30 seconds unsupported ?Sitting with Back Unsupported but Feet Supported on Floor or Stool: Able to sit safely and securely 2 minutes ?Stand to Sit: Controls descent by using hands ?Transfers: Able to transfer safely, definite need of hands ?Standing Unsupported with Eyes Closed: Able to stand 10 seconds with supervision ?Standing Ubsupported with Feet Together: Able to place feet together independently  but unable to hold for 30 seconds ?From Standing, Reach Forward with Outstretched Arm: Reaches forward but needs supervision ?From Standing Position, Pick up Object from Floor: Unable to pick up and needs supervision ?From Standing Position, Turn to Look Behind Over each Shoulder: Needs supervision when turning ?Turn 360 Degrees: Needs close supervision or verbal cueing ?Standing Unsupported, Alternately Place Feet on Step/Stool: Able to complete >2 steps/needs minimal assist ?Standing Unsupported, One Foot in Front: Needs help to step but can hold 15 seconds ?Standing on One Leg: Tries to lift leg/unable to hold 3 seconds but remains standing independently ?Total Score: 27/56 ?Patient demonstrated increased fall risk noted by score of 27/56 on the [facility] Scale.  ?<45/56 = fall risk, <42/56 = predictive of recurrent falls, <40/56 = 100% fall risk  ?>41 = independent, 21-40 = assistive device, 0-20 = wheelchair level  ?MDC 6.9 (4 pts 45-56, 5 pts 35-44, 7 pts 25-34) ?(ANPTA Core Set of Outcome Measures for Adults with Neurologic Conditions, 2018) ?Extremity Assessment  ?  ?  ? BLE demonstrates 4 to 4+/ 5 proximal to distal ?  ? Patient supine in bed on entrance to room. Patient alert and agreeable to PT session. Continues to demonstrate expressive aphasia but improving since eval.  ? ?Patient with no pain complaint throughout session. ? ?Therapeutic Activity: ?Bed Mobility: Patient performed supine <> sit  with Mod I requiring use of bedrails and extra time to complete.  ?Transfers: Patient performed sit<>stand and stand pivot transfers throughout session with supervision for balance.safety. Provided verbal cues for hand positioning and maintaining walker positionin front of pt as she likes to discard RW when close to target seat.  ? ?Pt performed car transfer with cues for technique and supervision using RW on approach. ? ?Gait Training:  ?Patient ambulated short in -room distances as well as 125' x2 using RW with supervision/ int CGA. Demonstrated forward flexed posture and cues for attn to task. Provided vc/ tc for maintaining walker position and upright posture. Continues to demonstrate reciprocal step technique when completing staeps on ascent and step-to pattern on descent. Close supervision throughout ? ?Neuromuscular Re-ed: ?NMR facilitated during session with focus on standing balance. Challenged with reaching to targets while standing with UE support and requiring step to reach. With UE support , she is able to maintain balance with minimal need for guard. Pt guided in NMR performed for improvements in motor control and coordination, balance, sequencing, judgement, and self confidence/ efficacy in performing all aspects of mobility at highest level of independence.  ? ? ?Patient supine  in bed at end of session with brakes locked, bed alarm set, and all needs within reach. ? ? ? ?Breck Coons PT, DPT ?Berenice Primas PT, DPT ?07/23/2021, 4:24 PM ?

## 2021-07-23 NOTE — Discharge Summary (Signed)
Physician Discharge Summary  ?Patient ID: ?Denise Page ?MRN: [id number] ?DOB/AGE: September 01, 1940 81 y.o. ? ?Admit date: 07/17/2021 ?Discharge date: 07/25/2021 ? ?Discharge Diagnoses:  ?Principal Problem: ?  SDH (subdural hematoma) ?Active Problems: ?  Herpes zoster without complication ?  Dysphagia, post-stroke ?  Chronic diastolic congestive heart failure ([place]) ?  Essential hypertension ?  Hemiparesis affecting left side as late effect of stroke ([place]) ?  Iron deficiency anemia ? ? ?Discharged Condition: serious ? ?Significant Diagnostic Studies: N/A ? ? ?Labs:  ?Basic Metabolic Panel: ?Recent Labs  ?Lab 07/18/21 ?0532 07/20/21 ?5956  ?NA 137 138  ?K 4.0 4.1  ?CL 102 104  ?CO2 24 25  ?GLUCOSE 108* 97  ?BUN 15 15  ?CREATININE 0.93 1.02*  ?CALCIUM 8.8* 8.7*  ? ? ?CBC: ?Recent Labs  ?Lab 07/22/21 ?3875 07/24/21 ?6433 07/25/21 ?2951  ?WBC 6.0 4.4 3.6*  ?HGB 7.9* 7.6* 7.6*  ?HCT 24.5* 24.1* 23.9*  ?MCV 88.1 89.6 90.2  ?PLT 190 163 165  ? ? ?CBG: ?No results for input(s): GLUCAP in the last 168 hours. ? ?Brief HPI:   Denise Page is a 81 y.o. 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  She had poor recovery with obtundation and was discharged to hospice on 02/15 but started showing signs of recovery therefore presented to the ED on 02/17.  Repeat CT of head showed acute on chronic SDH without changes and new acute blood overlying left frontal and parietal lobe with no change in severe left to right 1.4 cm midline shift.  Lethargy was felt to be secondary to Keppra which was dc at discharge from [facility].  ? ?Dr. Venetia Constable  was consulted for input and recommended MMA embolization to stop further hemorrhage if patient continued to improve.  Hospital course was significant for issues with confusion as well as bouts of agitation, confusion as well as anxiety at nights.   MMA embolization was deferred due to anesthesia risk and concerns of not being liberalized from vent postprocedure.  She did develop outbreak of herpes zoster and was started on Valtrex on 02/19.  Therapy was ongoing and patient was showing improvement in activity tolerance as well as ability to follow commands with multimodal cues.  CIR was recommended due to functional deficits. ? ? ?Hospital Course: Denise Page was admitted to rehab 07/17/2021 for inpatient therapies to consist of PT, ST and OT at least three hours five days a week. Past admission physiatrist, therapy team and rehab RN have worked together to provide customized collaborative inpatient rehab. Blood pressures were monitored on TID basis and have been controlled on current regimen.  Magnesium gluconate was added at bedtime to help with BP control.  She continues on low salt restrictions without signs of overload.  Valtrex was discontinued on 02/27 as ulcers were healing over.  Mood has been stable on home dose Lexapro and BuSpar.  ? ?Follow-up CBC showed acute blood loss anemia with drop felt to be due to iron deficiency and neutropenia has resolved.  Iron supplement was resumed and family advised to increase this to twice daily as well as resume vitamin O84 and folic acid at discharge.  Still required x1 was checked and was negative for blood and recommend repeat CBC on outpatient basis. Follow up BMET showed mild AKI otherwise is in normal limits.  Abdominal pain has resolved  and she is voiding without difficulty.  She is tolerating dysphagia 3 diet and intake has been improving.  She has made steady gains and currently requires supervision for safety.  She will continue to receive follow-up outpatient PT, OT and ST at [facility] after discharge.  ? ? ?Rehab course: During patient's stay in rehab team conference was held to monitor patient's progress, set goals and discuss barriers to discharge. At admission, patient required mod assist  with basic ADL tasks and with mobility.  She exhibited moderate to severe cognitive deficits as well as motor planning deficits affecting verbal expression.  She  has had improvement in activity tolerance, balance, postural control as well as ability to compensate for deficits.  She is able to complete ADL tasks with supervision.  She requires supervision with verbal cues for transfers and contact-guard assist to to ambulate 175 feet with use of RW. She requires supervision for expression, attention and awareness.  She is tolerating dysphagia 3 diet without S/S of aspiration.  Family education was completed. ? ?Disposition: Home ? ?Diet:  Regular.  ? ?Special Instructions: ?Recommend repeat CBC in 1 to 2 weeks after discharge. ?2.  Family to provide supervision with medication management and cognitive tasks. ? ? ?Allergies as of 07/25/2021   ? ?   Reactions  ? Morphine And Related Anaphylaxis  ? Sulfa Antibiotics Anaphylaxis  ? Codeine Nausea And Vomiting  ? Tramadol Nausea And Vomiting  ? ?  ? ?  ?Medication List  ?  ? ?STOP taking these medications   ? ?feeding supplement Liqd ?  ?rosuvastatin 5 MG tablet ?Commonly known as: CRESTOR ?  ?sucralfate 1 g tablet ?Commonly known as: CARAFATE ?  ?valACYclovir 1000 MG tablet ?Commonly known as: VALTREX ?  ? ?  ? ?TAKE these medications   ? ?acetaminophen 650 MG CR tablet ?Commonly known as: TYLENOL ?Take 650 mg by mouth every 8 (eight) hours as needed for pain. ?  ?busPIRone 5 MG tablet ?Commonly known as: BUSPAR ?Take 1 tablet (5 mg total) by mouth 2 (two) times daily. ?  ?capsaicin 0.025 % cream ?Commonly known as: ZOSTRIX ?Apply topically 2 (two) times daily. ?  ?carvedilol 6.25 MG tablet ?Commonly known as: COREG ?Take 1 tablet (6.25 mg total) by mouth 2 (two) times daily with a meal. ?  ?escitalopram 5 MG tablet ?Commonly known as: LEXAPRO ?Take 1 tablet (5 mg total) by mouth at bedtime. ?  ?ferrous sulfate 325 (65 FE) MG tablet ?Take 1 tablet (325 mg total) by mouth  2 (two) times daily with a meal. ?What changed: when to take this ?  ?folic acid 1 MG tablet ?Commonly known as: FOLVITE ?Take 1 tablet (1 mg total) by mouth in the morning. ?  ?levothyroxine 25 MCG tablet ?Commonly known as: SYNTHROID ?Take 2 tablets (50 mcg total) by mouth daily before breakfast. ?What changed: medication strength ?  ?multivitamin with minerals Tabs tablet ?Take 1 tablet by mouth daily. ?  ?pantoprazole 40 MG tablet ?Commonly known as: PROTONIX ?Take 1 tablet (40 mg total) by mouth daily. ?  ?vitamin B-12 1000 MCG tablet ?Commonly known as: CYANOCOBALAMIN ?Take 1,000 mcg by mouth daily. ?  ? ?  ? ? Follow-up Information   ? ? Clemmie Krill E, PA-C Follow up.   ?Specialty: Physician Assistant ?Why: call for follow up appointment ?Contact information: ?[address] ?[place] [address] ?[phone] ? ? ?  ?  ? ? Minus Breeding, MD .   ?Specialty: Cardiology ?  Contact information: ?[address] ?[place] [address] ?[phone] ? ? ?  ?  ? ? Meredith Staggers, MD. Call.   ?Specialty: Physical Medicine and Rehabilitation ?Why: office will call you with follow up appointment ?Contact information: ?[address] ?[address] ?[place] [address] ?[phone] ? ? ?  ?  ? ? Judith Part, MD. Call.   ?Specialty: Neurosurgery ?Why: As needed ?Contact information: ?[place] [address] ?[phone] ? ? ?  ?  ? ?  ?  ? ?  ? ? ?Signed: ?Bary Leriche ?07/27/2021, 6:43 PM ?  ?

## 2021-07-23 NOTE — Progress Notes (Addendum)
Occupational Therapy TBI Note ? ?Patient Details  ?Name: Ramah Langhans ?MRN: [id number] ?Date of Birth: 08-Jan-1941 ? ?Today's Date: 07/23/2021 ?OT Individual Time: 3833-3832 ?OT Individual Time Calculation (min): 60 min  ? ? ?Short Term Goals: ?Week 1:  OT Short Term Goal 1 (Week 1): Pt will groom in standing with CGA to demo improved standing tolerance ?OT Short Term Goal 2 (Week 1): Pt will transfer to toilet wiht CGA and LRAD ?OT Short Term Goal 3 (Week 1): Pt will bathe wiht no more than min cuing for sequencing ?OT Short Term Goal 4 (Week 1): Pt will scan to R for 5/5 ADL items with no cuing to demo imroved R attention ? ?Skilled Therapeutic Interventions/Progress Updates:  ?   ?Pt received in bed with no pain but fatigue  ?ADL: ?Pt daughter present today and wanting to head out, hwoever discussed TTB and bathroom modification in prep for DC. Recommending TTB and pt daughter reporting she would be able ot obtain. Provided printed handout of information on supervision level DC instructions (included below) and TTB information. Pt completes bathing/dressing/toileting at ambulatory level as well as practice TTB trasnfer with RW with overall supervision. Pt requires redirection occasionally d/t tengential verbosity when getting undressed as well as VC for safety awareness and R inattention cuing. All ADLs compelted at supervision level.  ? ?Pt left at end of session in bed with exit alarm on, call light in reach and all needs met ? ?General mobility- they should always use their RW. They may benefit from a walker tray to transport items from room to room if walking with a walker is recommended by physical therapy. The walker should be kept within reach so they can pull it close to get up and keep with them to back up to any surface they want to sit on. When getting up, they should push up from the surface they are getting up from and reach back when sitting to a new surface, no plopping.  ?You are their ?shadow.?  Especially in the beginning. You, as the helper should be in reach of the patient when mobilizing. You should be either beside or behind them so if they lose their balance you can assist by helping correct at the hips. This is likely closer than you are used to being- be in their personal space. Use a gait belt if that makes you feel more comfortable ?If you are attempting to get up/transfer and it is not going well, reset. Have them sit back down. Make sure they are close to the edge of the seat, feet are underneath them at hips distance, and they are leaning forward to stand up. ?Bathing- they should sit to bathe on a shower chair, especially for washing legs/feet. Sitting will save energy and increase safety. ?For a tub shower with shower chair: Use the walker to walk up to shower/tub edge and leave it to the side, but close. they can use the wall to steady as they step over or a grab bar. Do your best to dry off the floor prior to getting out of the shower ?For tub shower with Tub bench: use walker to get to the edge of the tub bench, back up to the edge, reach back prior to sitting down. Turn to swing legs into tub and scoot across. Reverse to exit the tub. Make sure both legs are out of the tub prior to standing to exit the bathroom ?Walk in shower: walk up to the shower ledge, turn  around and back up to the ledge with the walker. Keep both hands-on walker while they step back one foot at a time ?Dressing- all should be done from a SEATED level, especially to put underwear and pants over feet.  ?Toileting- the RW can be walked right over the toilet for standing urination if applicable. If seated toileting is more appropriate, have them walk up to the toilet and keep walker with them as they turn to sit to toilet or BSC. Before they stand to pull up pants past hips they should pull pants/underwear up past their knees to decrease the need to bend forward to the floor. Sometimes this makes people dizzy ?if  incontinence/bathroom accidents are an issue attempt to toilet every 2-3 hours to improve success with toileting and decrease accidents.  ?Energy conservation principles- ?Prioritize what needs to be done and what can be moved to another day ?Plan out their days, weeks, months to spread out taxing (physical or cognitively tiring) activities to not put too much at one time ?Pace activities- rest before feeling tired and have designated places to rest if they feel tired and need to take a brake ?Position for success: sit when able to conserve 25% more energy than standing  ? ? ?Therapy Documentation ?Precautions:  ?Precautions ?Precautions: Fall, Other (comment) ?Precaution Comments: expressive aphasia, cognitive deficits ?Restrictions ?Weight Bearing Restrictions: No ?General: ?  ?Vital Signs: ?Therapy Vitals ?Temp: 98.5 ?F (36.9 ?C) ?Temp Source: Oral ?Pulse Rate: 82 ?Resp: 16 ?BP: 115/60 ?Patient Position (if appropriate): Lying ?Oxygen Therapy ?SpO2: 97 % ?O2 Device: Room Air ?Pain: ?  ?Agitated Behavior Scale: ?TBI 16 ?  ?  ? ? ?Therapy/Group: Individual Therapy ? ?Lowella Dell Savas Elvin ?07/23/2021, 6:58 AM ?

## 2021-07-23 NOTE — Progress Notes (Signed)
Patient ID: Denise Page, female   DOB: 12/17/1940, 81 y.o.   MRN: [id number] ? ?SW spoke with Mary/ [facility] (p:[phone]/[phone]) to discuss referral. Confirms received.  ? ?SW met with pt dtr [name] to discuss above. Reports pt no longer has 3in1 BSC and will need one. SW will order 3in1 BSC and RW.  ? ?SW ordered 3in1 BSC and RW with [employer] via parachute.  ? ?Loralee Pacas, MSW, LCSWA ?Office: [phone] ?Cell: [phone] ?Fax: [phone]  ?

## 2021-07-23 NOTE — Progress Notes (Signed)
Physical Therapy Session Note ? ?Patient Details  ?Name: Denise Page ?MRN: [id number] ?Date of Birth: [birth year] ? ?Today's Date: 07/23/2021 ?PT Individual Time: 1104-1200 ?PT Individual Time Calculation (min): 56 min  ? ?Short Term Goals: ?Week 1:  PT Short Term Goal 1 (Week 1): STGs = LTGs ? ?Skilled Therapeutic Interventions/Progress Updates:  ?   ?Pt received seated in WC and agrees to therapy. No complaint of pain. PT educates daughter on safe guarding of pt upon discharge and PT recommendations for mobility. Daughter is able to re-teach to PT. Pt performs sit to stand with verbal cues for hand placement and use of RW. Pt ambulates x200' to gym with RW and cues for increased step height and stride length, as well as maintaining upright gaze and posture to improve balance. Seated rest break. Pt completes x8 6" steps with bilateral hand rails and CGA, with cues for sequencing. Pt performs car transfers and ramp navigation with CGA and cues for sequencing and positioning. Following seated rest break, pt ambulates x175 without RW, requiring minA and pt using L upper extremity around PT's waist for support. Performed for strength and balance training. Pt ambulates final bout of 175' with RW, this time requiring x1 instance of minA due to LOB. Likely associated with pt's forward flexed posture and downward gaze, which pt adjusts with verbal and tactile cueing but quickly deteriorates within several seconds. Pt left seated in WC with alarm intact and all needs within reach. ? ?Therapy Documentation ?Precautions:  ?Precautions ?Precautions: Fall, Other (comment) ?Precaution Comments: expressive aphasia, cognitive deficits ?Restrictions ?Weight Bearing Restrictions: No ? ? ? ?Therapy/Group: Individual Therapy ? ?Breck Coons, PT, DPT ?07/23/2021, 11:38 AM  ?

## 2021-07-24 DIAGNOSIS — D62 Acute posthemorrhagic anemia: Secondary | ICD-10-CM

## 2021-07-24 LAB — CBC
HCT: 24.1 % — ABNORMAL LOW (ref 36.0–46.0)
Hemoglobin: 7.6 g/dL — ABNORMAL LOW (ref 12.0–15.0)
MCH: 28.3 pg (ref 26.0–34.0)
MCHC: 31.5 g/dL (ref 30.0–36.0)
MCV: 89.6 fL (ref 80.0–100.0)
Platelets: 163 10*3/uL (ref 150–400)
RBC: 2.69 MIL/uL — ABNORMAL LOW (ref 3.87–5.11)
RDW: 17.8 % — ABNORMAL HIGH (ref 11.5–15.5)
WBC: 4.4 10*3/uL (ref 4.0–10.5)
nRBC: 0 % (ref 0.0–0.2)

## 2021-07-24 LAB — IRON AND TIBC
Iron: 51 ug/dL (ref 28–170)
Saturation Ratios: 17 % (ref 10.4–31.8)
TIBC: 304 ug/dL (ref 250–450)
UIBC: 253 ug/dL

## 2021-07-24 LAB — FERRITIN: Ferritin: 38 ng/mL (ref 11–307)

## 2021-07-24 LAB — OCCULT BLOOD X 1 CARD TO LAB, STOOL: Fecal Occult Bld: NEGATIVE

## 2021-07-24 MED ORDER — ESCITALOPRAM OXALATE 5 MG PO TABS: 5.0000 mg | ORAL_TABLET | Freq: Every day | ORAL | 0 refills | Status: DC

## 2021-07-24 MED ORDER — BUSPIRONE HCL 5 MG PO TABS: 5.0000 mg | ORAL_TABLET | Freq: Two times a day (BID) | ORAL | 0 refills | Status: AC

## 2021-07-24 MED ORDER — FERROUS SULFATE 325 (65 FE) MG PO TABS
325.0000 mg | ORAL_TABLET | Freq: Every day | ORAL | Status: DC
  Administered 2021-07-24: 325 mg via ORAL
  Filled 2021-07-24: qty 1

## 2021-07-24 MED ORDER — FOLIC ACID 1 MG PO TABS: 1.0000 mg | ORAL_TABLET | Freq: Every morning | ORAL | 0 refills | Status: AC

## 2021-07-24 MED ORDER — LEVOTHYROXINE SODIUM 25 MCG PO TABS: 50.0000 ug | ORAL_TABLET | Freq: Every day | ORAL | 0 refills | Status: DC

## 2021-07-24 MED ORDER — PANTOPRAZOLE SODIUM 40 MG PO TBEC: 40.0000 mg | DELAYED_RELEASE_TABLET | Freq: Every day | ORAL | 0 refills | Status: AC

## 2021-07-24 MED ORDER — FERROUS SULFATE 325 (65 FE) MG PO TABS: 325.0000 mg | ORAL_TABLET | Freq: Every day | ORAL | 3 refills | Status: DC

## 2021-07-24 MED ORDER — CARVEDILOL 6.25 MG PO TABS: 6.2500 mg | ORAL_TABLET | Freq: Two times a day (BID) | ORAL | 0 refills | Status: DC

## 2021-07-24 MED ORDER — FERROUS SULFATE 325 (65 FE) MG PO TABS: 325.0000 mg | ORAL_TABLET | Freq: Two times a day (BID) | ORAL | 3 refills | Status: AC

## 2021-07-24 MED ORDER — LEVOTHYROXINE SODIUM 25 MCG PO TABS: 25.0000 ug | ORAL_TABLET | Freq: Every day | ORAL | 0 refills | Status: DC

## 2021-07-24 NOTE — Progress Notes (Signed)
Physical Therapy Session Note ? ?Patient Details  ?Name: Denise Page ?MRN: [id number] ?Date of Birth: [birth year] ? ?Today's Date: 07/24/2021 ?PT Individual Time: 4193-7902 ?PT Individual Time Calculation (min): 68 min  ? ?Short Term Goals: ?Week 1:  PT Short Term Goal 1 (Week 1): STGs = LTGs ? ?Skilled Therapeutic Interventions/Progress Updates:  ? ?Pt received sitting I on toilet with NT present, and agreeable to PT. Stand pivot transfer to [facility] with min assist for safety and no UE support. Pt requesting to don makeup sitting in WC, but once positioned at sink, does not initiate.  ? ?Pt transported to rehab gym. Gait training with RW provided by medical supply company x 159ft with supevrision assist. Noted to have difficulty controlling RW in turns with rubbed feet in place. PT replaced rubber feet with plastice. Additional gait training x 123ft with supervision assist and RW. Noted to have improved safety in turns with plastic feet.  ? ?Patient demonstrates increased fall risk as noted by score of   27/56 on Berg Balance Scale.  (<36= high risk for falls, close to 100%; 37-45 significant >80%; 46-51 moderate >50%; 52-55 lower >25%) ? ?Stair negotiation x 8 +4 with supervision assist and cues for step to gait pattern on descent, noted to have step through with ascent, regardless of instruction from PT. BUE support on rails throughout for safety ? ?Stand pivot transfer to Nustep with RW and supervision assist. Nustep sustained attention task x 12 min with cues for continued participation and full ROM intermittently.  ? ?Pt returned to room and performed stand pivot transfer to bed with supervision assist and no AD. Sit>supine completed with out assist, and left supine in bed with call bell in reach and all needs met.  ? ? ?   ? ?Therapy Documentation ?Precautions:  ?Precautions ?Precautions: Fall, Other (comment) ?Precaution Comments: expressive aphasia ?Restrictions ?Weight Bearing Restrictions: No ? ? ?Pain: ?Pain  Assessment ?Pain Scale: Faces ?Faces Pain Scale: No hurt denies ?Mobility: ?Bed Mobility ?Supine to Sit: Supervision/Verbal cueing ?Sit to Supine: Supervision/Verbal cueing ?Transfers ?Transfers: Sit to Stand;Stand to [employer] Transfers ?Sit to Stand: Supervision/Verbal cueing ?Stand to Sit: Supervision/Verbal cueing ?Stand Pivot Transfers: Supervision/Verbal cueing ?Transfer (Assistive device): Rolling walker ? ?   ?Trunk/Postural Assessment : ?Cervical Assessment ?Cervical Assessment: Within Functional Limits (foward head) ?Thoracic Assessment ?Thoracic Assessment:  (rounded shoulders) ?Lumbar Assessment ?Lumbar Assessment:  (posterior pelvic tilt) ?Postural Control ?Postural Control: Within Functional Limits  ?Balance: ?Berg Balance Test ?Sit to Stand: Able to stand  independently using hands ?Standing Unsupported: Able to stand 30 seconds unsupported ?Sitting with Back Unsupported but Feet Supported on Floor or Stool: Able to sit safely and securely 2 minutes ?Stand to Sit: Controls descent by using hands ?Transfers: Able to transfer safely, definite need of hands ?Standing Unsupported with Eyes Closed: Able to stand 10 seconds with supervision ?Standing Ubsupported with Feet Together: Able to place feet together independently but unable to hold for 30 seconds ?From Standing, Reach Forward with Outstretched Arm: Reaches forward but needs supervision ?From Standing Position, Pick up Object from Floor: Unable to pick up and needs supervision ?From Standing Position, Turn to Look Behind Over each Shoulder: Needs supervision when turning ?Turn 360 Degrees: Needs close supervision or verbal cueing ?Standing Unsupported, Alternately Place Feet on Step/Stool: Able to complete >2 steps/needs minimal assist ?Standing Unsupported, One Foot in Front: Needs help to step but can hold 15 seconds ?Standing on One Leg: Tries to lift leg/unable to hold 3 seconds but  remains standing independently ?Total Score: 27 ?Static  Sitting Balance ?Static Sitting - Level of Assistance: 5: Stand by assistance ?Dynamic Sitting Balance ?Dynamic Sitting - Level of Assistance: 5: Stand by assistance ?Static Standing Balance ?Static Standing - Level of Assistance: 5: Stand by assistance ?Dynamic Standing Balance ?Dynamic Standing - Balance Support: Bilateral upper extremity supported ?Dynamic Standing - Level of Assistance: 5: Stand by assistance ? ? ?Therapy/Group: Individual Therapy ? ?Lorie Phenix ?07/24/2021, 10:35 AM  ?

## 2021-07-24 NOTE — Progress Notes (Signed)
Inpatient Rehabilitation Discharge Medication Review by a Pharmacist ? ?A complete drug regimen review was completed for this patient to identify any potential clinically significant medication issues. ? ?High Risk Drug Classes Is patient taking? Indication by Medication  ?Antipsychotic No   ?Anticoagulant No   ?Antibiotic No   ?Opioid No   ?Antiplatelet No   ?Hypoglycemics/insulin No   ?Vasoactive Medication Yes Coreg- hypertension  ?Chemotherapy No   ?Other Yes Trazodone- sleep ?Buspar- anxiety ?Lexapro- anxiety/MDD ?Synthroid- hypothyroidism ?Protonix- GERD  ? ? ? ?Type of Medication Issue Identified Description of Issue Recommendation(s)  ?Drug Interaction(s) (clinically significant) ?    ?Duplicate Therapy ?    ?Allergy ?    ?No Medication Administration End Date ?    ?Incorrect Dose ?    ?Additional Drug Therapy Needed ?    ?Significant med changes from prior encounter (inform family/care partners about these prior to discharge).    ?Other ? PTA meds ?Rosuvastatin ?Carafate Restart PTA meds when and if clinically necessary during CIR admission or at time of discharge.  ? ? ?Clinically significant medication issues were identified that warrant physician communication and completion of prescribed/recommended actions by midnight of the next day:  No ? ? ?Time spent performing this drug regimen review (minutes): 30 ? ? ?Ibrahem Volkman BS, PharmD, BCPS ?Clinical Pharmacist ?07/24/2021 12:55 PM ? ? ? ? ? ? ? ?

## 2021-07-24 NOTE — Progress Notes (Signed)
Occupational Therapy Discharge Summary ? ?Patient Details  ?Name: Denise Page ?MRN: [id number] ?Date of Birth: 06-24-1940 ? ?Today's Date: 07/24/2021 ?OT Individual Time: 0800-0900 ?OT Individual Time Calculation (min): 60 min  ? ?1:1 Grad day! Pt in bed when arrived. Pt participated in self care retraining. Pt participated in picking out clothing for the day. Ambulated around the room to gather supplies with supervision with RW. Pt performed toileting with supervision and transitioned into the the shower. PT didn't demonstrate perseveration today in bathing. Pt bathed with supervision including lower body. ?Pt dressed from clothing pile with supervision. Pt able to engage in self feeding with needing A with opening containers. Pt oriented x4 and excited to go home sAt.  ? ? ?Patient has met 13 of 13 long term goals due to improved activity tolerance, improved balance, postural control, ability to compensate for deficits, functional use of  RIGHT upper extremity, improved attention, improved awareness, and improved coordination.  Patient to discharge at overall Supervision level.  Patient's care partner is independent to provide the necessary physical and cognitive assistance at discharge.   ? ?Reasons goals not met: n/a ? ?Recommendation:  ?Patient will benefit from ongoing skilled OT services in home health setting to continue to advance functional skills in the area of BADL and Reduce care partner burden. ? ?Equipment: ?No equipment provided ? ?Reasons for discharge: treatment goals met and discharge from hospital ? ?Patient/family agrees with progress made and goals achieved: Yes ? ?OT Discharge ?Precautions/Restrictions  ?Precautions ?Precautions: Fall;Other (comment) ?Precaution Comments: expressive aphasia ?Restrictions ?Weight Bearing Restrictions: No ?General ?  ?Vital Signs ?Therapy Vitals ?Temp: 98.7 ?F (37.1 ?C) ?Temp Source: Oral ?Pulse Rate: 80 ?Resp: 16 ?BP: (!) 107/46 ?Patient Position (if  appropriate): Lying ?Oxygen Therapy ?SpO2: 96 % ?O2 Device: Room Air ?Pain ? No indications of pain  ?ADL ?ADL ?Eating: Supervision/safety ?Grooming: Supervision/safety ?Where Assessed-Grooming: Standing at sink ?Upper Body Bathing: Supervision/safety ?Where Assessed-Upper Body Bathing: Sitting at sink ?Lower Body Bathing: Supervision/safety ?Where Assessed-Lower Body Bathing: Sitting at sink, Standing at sink ?Upper Body Dressing: Supervision/safety ?Where Assessed-Upper Body Dressing: Sitting at sink ?Lower Body Dressing: Supervision/safety ?Where Assessed-Lower Body Dressing: Sitting at sink, Standing at sink ?Toileting: Supervision/safety ?Where Assessed-Toileting: Toilet ?Toilet Transfer: Distant supervision ?Toilet Transfer Method: Ambulating ?Science writer: Grab bars ?Walk-In Shower Transfer: Close supervision ?Walk-In Shower Transfer Method: Ambulating ?Vision ?Vision Assessment?: Yes ?Alignment/Gaze Preference: Within Defined Limits ?Saccades: Within functional limits ?Visual Fields: No apparent deficits ?Perception  ?Perception: Within Functional Limits (improved attention to right side - still needs supervision) ?Praxis ?Praxis: Impaired (improved from eval) ?Cognition ?Overall Cognitive Status: Impaired/Different from baseline ?Orientation Level: Oriented to person;Oriented to place ?Year: 2023 ?Month: March ?Day of Week: Correct ?Attention: Sustained;Selective ?Sustained Attention: Appears intact ?Selective Attention: Impaired ?Selective Attention Impairment: Functional basic ?Memory Impairment: Decreased recall of new information ?Immediate Memory Recall: Sock;Blue;Bed ?Memory Recall Sock: With Cue ?Memory Recall Blue: Without Cue ?Memory Recall Bed: Without Cue ?Awareness: Impaired ?Awareness Impairment: Anticipatory impairment ?Problem Solving: Impaired ?Problem Solving Impairment: Functional basic ?Safety/Judgment: Impaired ?[employer] Scales of Cognitive Functioning:  Automatic/appropriate ?Sensation ?Sensation ?Light Touch: Appears Intact ?Proprioception: Appears Intact ?Coordination ?Gross Motor Movements are Fluid and Coordinated: Yes ?Fine Motor Movements are Fluid and Coordinated: Yes ?Coordination and Movement Description: improved. slight incoordination with right hand at times (seen in shower with washcloth) ?Motor  ?Motor ?Motor - Discharge Observations: improved right hemi paresis - improved significantly ?Mobility  ?Bed Mobility ?Supine to Sit: Supervision/Verbal cueing ?Sit to Supine: Supervision/Verbal cueing ?  Transfers ?Sit to Stand: Supervision/Verbal cueing ?Stand to Sit: Supervision/Verbal cueing  ?Trunk/Postural Assessment  ?Cervical Assessment ?Cervical Assessment: Within Functional Limits (foward head) ?Thoracic Assessment ?Thoracic Assessment:  (rounded shoulders) ?Lumbar Assessment ?Lumbar Assessment:  (posterior pelvic tilt) ?Postural Control ?Postural Control: Within Functional Limits  ?Balance ?Static Sitting Balance ?Static Sitting - Level of Assistance: 5: Stand by assistance ?Dynamic Sitting Balance ?Dynamic Sitting - Level of Assistance: 5: Stand by assistance ?Static Standing Balance ?Static Standing - Level of Assistance: 5: Stand by assistance ?Extremity/Trunk Assessment ?RUE Assessment ?RUE Assessment: Within Functional Limits ?Brunstrum level for arm: Stage V Relative Independence from Synergy ?Brunstrum level for hand: Stage VI Isolated joint movements ?LUE Assessment ?LUE Assessment: Within Functional Limits ? ? ?Nicoletta Ba ?07/24/2021, 8:42 AM ?

## 2021-07-24 NOTE — Progress Notes (Signed)
PHYSICAL MEDICINE & REHABILITATION PROGRESS NOTE ? ?Subjective/Complaints: ?Pt up at EOB with OT. No new issues today. Didn't sleep that well as she was excited thinking about going home.  ? ?ROS: Patient denies fever, rash, sore throat, blurred vision, dizziness, nausea, vomiting, diarrhea, cough, shortness of breath or chest pain, joint or back/neck pain, headache, or mood change.  ? ?Objective: ?Vital Signs: ?Blood pressure (!) 107/46, pulse 80, temperature 98.7 ?F (37.1 ?C), temperature source Oral, resp. rate 16, height 5\' 8"  (1.727 m), weight 72.9 kg, SpO2 96 %. ?No results found. ?Recent Labs  ?  07/22/21 ?1610 07/24/21 ?9604  ?WBC 6.0 4.4  ?HGB 7.9* 7.6*  ?HCT 24.5* 24.1*  ?PLT 190 163  ? ?No results for input(s): NA, K, CL, CO2, GLUCOSE, BUN, CREATININE, CALCIUM in the last 72 hours. ? ? ?Intake/Output Summary (Last 24 hours) at 07/24/2021 1041 ?Last data filed at 07/23/2021 1300 ?Gross per 24 hour  ?Intake 420 ml  ?Output --  ?Net 420 ml  ? ?  ? ?  ? ?Physical Exam: ?BP (!) 107/46 (BP Location: Right Arm)   Pulse 80   Temp 98.7 ?F (37.1 ?C) (Oral)   Resp 16   Ht 5\' 8"  (1.727 m)   Wt 72.9 kg   SpO2 96%   BMI 24.44 kg/m?  ?Constitutional: No distress . Vital signs reviewed. ?HEENT: NCAT, EOMI, oral membranes moist ?Neck: supple ?Cardiovascular: RRR without murmur. No JVD    ?Respiratory/Chest: CTA Bilaterally without wheezes or rales. Normal effort    ?GI/Abdomen: BS +, non-tender, non-distended ?Ext: no clubbing, cyanosis, or edema ?Psych: pleasant and cooperative  ?Skin: Warm and dry.  Crani inciison CDI with staples/sutures out ?Psych: Normal mood.  Normal behavior. ?Musc: No edema in extremities.  No tenderness in extremities. ?Neuro: Alert and oriented. Word finding deficits with some improvement. Improved insight and awareness. . No focal CN findings. Speech clearer  ?Motor: LUE/LE: 4 -/5.  RUE/RLE 5/5. Senses in all 4's. Good sitting balance.  ?  ? ?Assessment/Plan: ?1. Functional  deficits which require 3+ hours per day of interdisciplinary therapy in a comprehensive inpatient rehab setting. ?Physiatrist is providing close team supervision and 24 hour management of active medical problems listed below. ?Physiatrist and rehab team continue to assess barriers to discharge/monitor patient progress toward functional and medical goals ? ? ?Care Tool: ? ?Bathing ?   ?Body parts bathed by patient: Right arm, Left arm, Chest, Abdomen, Right upper leg, Left upper leg, Face, Front perineal area, Left lower leg, Right lower leg, Buttocks  ? Body parts bathed by helper: Buttocks ?  ?  ?Bathing assist Assist Level: Supervision/Verbal cueing ?  ?  ?Upper Body Dressing/Undressing ?Upper body dressing   ?What is the patient wearing?: Pull over shirt, Bra ?   ?Upper body assist Assist Level: Supervision/Verbal cueing ?   ?Lower Body Dressing/Undressing ?Lower body dressing ? ? ?   ?What is the patient wearing?: Pants, Underwear/pull up ? ?  ? ?Lower body assist Assist for lower body dressing: Supervision/Verbal cueing ?   ? ?Toileting ?Toileting    ?Toileting assist Assist for toileting: Supervision/Verbal cueing ?  ?  ?Transfers ?Chair/bed transfer ? ?Transfers assist ?   ? ?Chair/bed transfer assist level: Supervision/Verbal cueing ?  ?  ?Locomotion ?Ambulation ? ? ?Ambulation assist ? ?   ? ?Assist level: Contact Guard/Touching assist ?Assistive device: No Device ?Max distance: 15 ft  ? ?Walk 10 feet activity ? ? ?Assist ?   ? ?Assist  level: Minimal Assistance - Patient > 75% ?Assistive device: Hand held assist, No Device  ? ?Walk 50 feet activity ? ? ?Assist Walk 50 feet with 2 turns activity did not occur: Safety/medical concerns ? ?  ?   ? ? ?Walk 150 feet activity ? ? ?Assist Walk 150 feet activity did not occur: Safety/medical concerns ? ?  ?  ?  ? ?Walk 10 feet on uneven surface  ?activity ? ? ?Assist Walk 10 feet on uneven surfaces activity did not occur: Safety/medical concerns ? ? ?  ?    ? ?Wheelchair ? ? ? ? ?Assist Is the patient using a wheelchair?: Yes ?Type of Wheelchair: Manual ?  ? ?Wheelchair assist level: Minimal Assistance - Patient > 75% ?Max wheelchair distance: 150 ft  ? ? ?Wheelchair 50 feet with 2 turns activity ? ? ? ?Assist ? ?  ?  ? ? ?Assist Level: Minimal Assistance - Patient > 75%  ? ?Wheelchair 150 feet activity  ? ? ? ?Assist ?   ? ? ?Assist Level: Minimal Assistance - Patient > 75%  ?BP (!) 107/46 (BP Location: Right Arm)   Pulse 80   Temp 98.7 ?F (37.1 ?C) (Oral)   Resp 16   Ht 5\' 8"  (1.727 m)   Wt 72.9 kg   SpO2 96%   BMI 24.44 kg/m?   ? ?Medical Problem List and Plan: ?1. 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 -Continue CIR therapies including PT, OT, and SLP   ?-ELOS 3/4  ?2.  Antithrombotics: ?-DVT/anticoagulation:  Mechanical: Sequential compression devices, below knee Bilateral lower extremities ?            -antiplatelet therapy: N/A ?3. Pain Management: Tylenol prn.  ?4. Mood: LCSW to follow for evaluation and support when appropriate.  ?            -antipsychotic agents: N/A ?5. Neuropsych: This patient is not fully capable of making decisions on her own behalf. ? -telesitter as available, family ed ?6. Skin/Wound Care: dc staples/sutures today  ?7. Fluids/Electrolytes/Nutrition: Monitor I/OS ?8. HTN:  ?-Added magnesium gluconate 250mg  HS to assist blood pressure control-   ?-continue coreg ?-bp's controlled 3/3 ?9. Chronic diastolic CHF/TVAR 37/10: on Coreg bid.   ?-no signs of overload. ?            --continue low salt restrictions.  ?Filed Weights  ? 07/17/21 1653 07/17/21 1700  ?Weight: 72.9 kg 72.9 kg  ? ?10. Herpes Zoster: valtrex ends today ? -off airborne precautions ?11. H/o depression/anxiety: Continue Lexapro and buspar to manage symptoms. ?--sleep/wake reasonable at present   ?12. H/o PUD/acute on chronic anemia:  ? Hemoglobin 8.7 on 2/18---> down to 7.9 2/27---no signs of gross blood ? 3/3  hgb with drift down to 7.6 today. ?  -continue protonix bid  ?  -check iron panel ?  -need stool sample for OB today ?13. Impaired fasting glucose: will continue to monitor FBS with routine labs.  ?14. Delirium: continue safety measures with tele monitor. Discussed benefits of telesitter with daugghter.  ?15.  Abdominal pain: Urinary retention? Improved ? -appears to be voiding well. ?16.  Hypokalemia ? 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 D3 thins. Tolerating well ?  ?At least 35 total minutes were spent in examination of patient, assessment of pertinent data,  formulation of a treatment plan, and/or in discussion with patient and/or family.   ? ?LOS: 7 days ?A FACE  TO FACE EVALUATION WAS PERFORMED ? ?Meredith Staggers ?07/24/2021, 10:41 AM ? ?

## 2021-07-24 NOTE — Progress Notes (Signed)
Speech Language Pathology Discharge Summary ? ?Patient Details  ?Name: Denise Page ?MRN: [id number] ?Date of Birth: 08/10/1940 ? ?Today's Date: 07/24/2021 ?SLP Individual Time: 1200-1240 ?SLP Individual Time Calculation (min): 40 min ? ? ?Skilled Therapeutic Interventions:  ?Pt seen this date for skilled ST intervention targeting dysphagia management and communication goals outlined in POC. Pt encountered awake/alert and sitting upright in bed. Supportive daughter and daughter-in-law present. Agreeable to ST intervention at bedside.  ? ?SLP facilitated today's session by providing supervision level assistance for adherence to aspiration precautions. Pt communicated basic wants, needs, thoughts, and ideas with extra processing time. Pt corrected all verbal errors independently, and latency time between question and answers, during informal conversation, was much improved in comparison to previous sessions. During skilled observation of meal tray, pt benefited from set-up assistance for cutting food into smaller pieces, though fed self independently with implementation of aspiration precautions. No clinical s/sx concerning for aspiration were observed with Dysphagia 3 and thin liquid consistencies. SLP provided verbal education and written handouts re: Dysphagia 3 consistencies (safe and not safe), as well as strategies to maximize receptive and expression language skills for functional/daily communication at home; pt and caregiver verbalized understanding of strategies and was observed to independently implement during session on 07/22/2021. All questions answered.  ? ?At the end of the session, SLP left pt in recliner chair with chair alarm activated and all needs within reach. Family present as well. Will discontinue ST plan of care at this time given discharge tomorrow (3/4) and meeting set goals.  ? ? ?Patient has met 6 of 6 long term goals.  Patient to discharge at overall Supervision level.  ?Reasons goals not  met: N/A  ? ?Clinical Impression/Discharge Summary: Pt has made excellent progress during her inpatient rehabilitation admission and has demonstrated improvement and completion of 6 out of 6 long-term goals set upon initial evaluation. Overall, she has improved from a Min A to a supervision level of assistance with in the areas of deglutition, verbal expression, attention, and awareness at the time of discharge. Nevertheless, deficits remain present; therefore, recommend OP ST follow-up to maximize pt's independence and decrease caregiver burden, in addition to providing ongoing education post-discharge. Pt and pt's caregiver verbalized understanding of recommendations and appear motivated to continue with therapy following discharge. ? ?Care Partner:  ?Caregiver Able to Provide Assistance: Yes  ?Type of Caregiver Assistance: Physical;Cognitive ? ?Recommendation:  ?Outpatient SLP  ?Rationale for SLP Follow Up: Maximize functional communication;Maximize cognitive function and independence;Maximize swallowing safety;Reduce caregiver burden  ? ?Equipment: N/A  ? ?Reasons for discharge: Treatment goals met;Discharged from hospital  ? ?Patient/Family Agrees with Progress Made and Goals Achieved: Yes  ? ? ? A  ?07/24/2021, 1:08 PM ? ?

## 2021-07-25 DIAGNOSIS — D649 Anemia, unspecified: Secondary | ICD-10-CM

## 2021-07-25 LAB — CBC
HCT: 23.9 % — ABNORMAL LOW (ref 36.0–46.0)
Hemoglobin: 7.6 g/dL — ABNORMAL LOW (ref 12.0–15.0)
MCH: 28.7 pg (ref 26.0–34.0)
MCHC: 31.8 g/dL (ref 30.0–36.0)
MCV: 90.2 fL (ref 80.0–100.0)
Platelets: 165 10*3/uL (ref 150–400)
RBC: 2.65 MIL/uL — ABNORMAL LOW (ref 3.87–5.11)
RDW: 18.1 % — ABNORMAL HIGH (ref 11.5–15.5)
WBC: 3.6 10*3/uL — ABNORMAL LOW (ref 4.0–10.5)
nRBC: 0 % (ref 0.0–0.2)

## 2021-07-25 NOTE — Progress Notes (Signed)
[place] PHYSICAL MEDICINE & REHABILITATION PROGRESS NOTE ? ?Subjective/Complaints: ?Pt up in bed. Extremely excited to go home!.  ? ?ROS: Patient denies fever, rash, sore throat, blurred vision, dizziness, nausea, vomiting, diarrhea, cough, shortness of breath or chest pain, joint or back/neck pain, headache, or mood change.  ? ?Objective: ?Vital Signs: ?Blood pressure (!) 116/50, pulse 79, temperature 98.8 ?F (37.1 ?C), resp. rate 18, height '5\' 8"'$  (1.727 m), weight 72.9 kg, SpO2 98 %. ?No results found. ?Recent Labs  ?  07/24/21 ?6387 07/25/21 ?5643  ?WBC 4.4 3.6*  ?HGB 7.6* 7.6*  ?HCT 24.1* 23.9*  ?PLT 163 165  ? ?No results for input(s): NA, K, CL, CO2, GLUCOSE, BUN, CREATININE, CALCIUM in the last 72 hours. ? ? ?Intake/Output Summary (Last 24 hours) at 07/25/2021 0844 ?Last data filed at 07/25/2021 0700 ?Gross per 24 hour  ?Intake 716 ml  ?Output --  ?Net 716 ml  ? ?  ? ?  ? ?Physical Exam: ?BP (!) 116/50 (BP Location: Right Arm)   Pulse 79   Temp 98.8 ?F (37.1 ?C)   Resp 18   Ht '5\' 8"'$  (1.727 m)   Wt 72.9 kg   SpO2 98%   BMI 24.44 kg/m?  ?Constitutional: No distress . Vital signs reviewed. ?HEENT: NCAT, EOMI, oral membranes moist ?Neck: supple ?Cardiovascular: RRR without murmur. No JVD    ?Respiratory/Chest: CTA Bilaterally without wheezes or rales. Normal effort    ?GI/Abdomen: BS +, non-tender, non-distended ?Ext: no clubbing, cyanosis, or edema ?Psych: pleasant and cooperative  ?Skin: Warm and dry.  Crani inciison CDI with staples/sutures out ?Psych: Normal mood.  Normal behavior. ?Musc: No edema in extremities.  No tenderness in extremities. ?Neuro: Alert and oriented. Improved word finding. Improved insight and awareness. . No focal CN findings. Speech clearer  ?Motor: LUE/LE: 4 -/5.  RUE/RLE 5/5. Senses in all 4's. Excellent sitting balance.  ?  ? ?Assessment/Plan: ?1. Functional deficits which require 3+ hours per day of interdisciplinary therapy in a comprehensive inpatient rehab  setting. ?Physiatrist is providing close team supervision and 24 hour management of active medical problems listed below. ?Physiatrist and rehab team continue to assess barriers to discharge/monitor patient progress toward functional and medical goals ? ? ?Care Tool: ? ?Bathing ?   ?Body parts bathed by patient: Right arm, Left arm, Chest, Abdomen, Right upper leg, Left upper leg, Face, Front perineal area, Left lower leg, Right lower leg, Buttocks  ? Body parts bathed by helper: Buttocks ?  ?  ?Bathing assist Assist Level: Supervision/Verbal cueing ?  ?  ?Upper Body Dressing/Undressing ?Upper body dressing   ?What is the patient wearing?: Pull over shirt, Bra ?   ?Upper body assist Assist Level: Supervision/Verbal cueing ?   ?Lower Body Dressing/Undressing ?Lower body dressing ? ? ?   ?What is the patient wearing?: Pants, Underwear/pull up ? ?  ? ?Lower body assist Assist for lower body dressing: Supervision/Verbal cueing ?   ? ?Toileting ?Toileting    ?Toileting assist Assist for toileting: Supervision/Verbal cueing ?  ?  ?Transfers ?Chair/bed transfer ? ?Transfers assist ?   ? ?Chair/bed transfer assist level: Supervision/Verbal cueing ?  ?  ?Locomotion ?Ambulation ? ? ?Ambulation assist ? ?   ? ?Assist level: Contact Guard/Touching assist ?Assistive device: No Device ?Max distance: 15 ft  ? ?Walk 10 feet activity ? ? ?Assist ?   ? ?Assist level: Minimal Assistance - Patient > 75% ?Assistive device: Hand held assist, No Device  ? ?Walk 50 feet activity ? ? ?  Assist Walk 50 feet with 2 turns activity did not occur: Safety/medical concerns ? ?  ?   ? ? ?Walk 150 feet activity ? ? ?Assist Walk 150 feet activity did not occur: Safety/medical concerns ? ?  ?  ?  ? ?Walk 10 feet on uneven surface  ?activity ? ? ?Assist Walk 10 feet on uneven surfaces activity did not occur: Safety/medical concerns ? ? ?  ?   ? ?Wheelchair ? ? ? ? ?Assist Is the patient using a wheelchair?: Yes ?Type of Wheelchair: Manual ?   ? ?Wheelchair assist level: Minimal Assistance - Patient > 75% ?Max wheelchair distance: 150 ft  ? ? ?Wheelchair 50 feet with 2 turns activity ? ? ? ?Assist ? ?  ?  ? ? ?Assist Level: Minimal Assistance - Patient > 75%  ? ?Wheelchair 150 feet activity  ? ? ? ?Assist ?   ? ? ?Assist Level: Minimal Assistance - Patient > 75%  ?BP (!) 116/50 (BP Location: Right Arm)   Pulse 79   Temp 98.8 ?F (37.1 ?C)   Resp 18   Ht '5\' 8"'$  (1.727 m)   Wt 72.9 kg   SpO2 98%   BMI 24.44 kg/m?   ? ?Medical Problem List and Plan: ?1. 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 -dc home today ?-f/u with [facility], NS, primary, cardiology as outpt  ?2.  Antithrombotics: ?-DVT/anticoagulation:  Mechanical: Sequential compression devices, below knee Bilateral lower extremities ?            -antiplatelet therapy: N/A ?3. Pain Management: Tylenol prn.  ?4. Mood: LCSW to follow for evaluation and support when appropriate.  ?            -antipsychotic agents: N/A ?5. Neuropsych: This patient is not fully capable of making decisions on her own behalf. ? -telesitter as available, family ed ?6. Skin/Wound Care: dc staples/sutures today  ?7. Fluids/Electrolytes/Nutrition: Monitor I/OS ?8. HTN:  ?-Added magnesium gluconate '250mg'$  HS to assist blood pressure control-   ?-continue coreg ?-bp's controlled 3/4 ?9. Chronic diastolic CHF/TVAR 26/20: on Coreg bid.   ?-no signs of overload. ?            --continue low salt restrictions.  ?Filed Weights  ? 07/17/21 1653 07/17/21 1700  ?Weight: 72.9 kg 72.9 kg  ? ?10. Herpes Zoster: valtrex ends today ? -off airborne precautions ?11. H/o depression/anxiety: Continue Lexapro and buspar to manage symptoms. ?--sleep/wake reasonable at present   ?12. H/o PUD/acute on chronic anemia:  ?  -no signs of gross blood ? 3/4 hgb remains 7.6 today. Discussed wth pt ?  -iron panel with low normal iron, ferritin, stool OB - ?  -continue protonix bid  ?  -added fe++  supp ?  -pt asymptomatic ?  -recommend f/u cbc as outpt over next 1-2 weeks ?13. Impaired fasting glucose: will continue to monitor FBS with routine labs.  ?14. Delirium: continue safety measures with tele monitor. Discussed benefits of telesitter with daugghter.  ?15.  Abdominal pain: Urinary retention? Improved ? -appears to be voiding well. ?16.  Hypokalemia ? 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 D3 thins. Tolerating well ?  ?  ? ?LOS: 8 days ?A FACE TO FACE EVALUATION WAS PERFORMED ? ?Meredith Staggers ?07/25/2021, 8:44 AM ? ?

## 2021-07-27 DIAGNOSIS — D509 Iron deficiency anemia, unspecified: Secondary | ICD-10-CM

## 2021-07-27 NOTE — Progress Notes (Signed)
Inpatient Rehabilitation Care Coordinator ?Discharge Note  ? ?Patient Details  ?Name: Denise Page ?MRN: [id number] ?Date of Birth: Apr 27, 1941 ? ? ?Discharge location: D/c to home with her dtr [name] who will provide supervision. Patient to have assistance from various family members. ? ?Length of Stay: 7 days ? ?Discharge activity level: Supervision ? ?Home/community participation: Limited ? ?Patient response ZW:CHENID Literacy - How often do you need to have someone help you when you read instructions, pamphlets, or other written material from your doctor or pharmacy?: Patient unable to respond ? ?Patient response PO:EUMPNT Isolation - How often do you feel lonely or isolated from those around you?: Patient unable to respond ? ?Services provided included: MD, RD, PT, OT, SLP, RN, CM, TR, Neuropsych ? ?Financial Services:  ?Charity fundraiser Utilized: Medicare ?  ? ?Choices offered to/list presented to: Yes ? ?Follow-up services arranged:  ?Outpatient, DME ?   ?Outpatient Servicies: [facility] for PT/OT/SLP ?DME : [place] [facility] and [facility] ?  ? ?Patient response to transportation need: ?Is the patient able to respond to transportation needs?: Yes ?In the past 12 months, has lack of transportation kept you from medical appointments or from getting medications?: No ?In the past 12 months, has lack of transportation kept you from meetings, work, or from getting things needed for daily living?: No ? ? ?Comments (or additional information): ? ?Patient/Family verbalized understanding of follow-up arrangements:  Yes ? ?Individual responsible for coordination of the follow-up plan: contact pt dtr [name] ? ?Confirmed correct DME delivered: Rana Snare 07/27/2021   ? ?Rana Snare ?

## 2021-07-28 ENCOUNTER — Telehealth: Payer: Self-pay | Admitting: Registered Nurse

## 2021-07-28 NOTE — Telephone Encounter (Signed)
Transitional Care call ?Transitional Questions answered by Daughter: [name] ? ?Patient name: Denise Page DOB: 12-31-1940 ?Are you/is patient experiencing any problems since coming home? Daughter stated on yesterday [name] had a mental outbursts with agitation, lasted for 10 minutes. After she took a nap she was calm. ?Are there any questions regarding any aspect of care? No ?Are there any questions regarding medications administration/dosing? No ?Are meds being taken as prescribed? Yes ??Patient should review meds with caller to confirm? Medication Lisat was Reviewed. ?Have there been any falls? No ?Has Home Health been to the house and/or have they contacted you? No ?If not, have you tried to contact them? [name] will be calling [facility] ?Can we help you contact them? No ?Are bowels and bladder emptying properly? Yes ?Are there any unexpected incontinence issues? No ?If applicable, is patient following bowel/bladder programs? NA ?Any fevers, problems with breathing, unexpected pain? No ?Are there any skin problems or new areas of breakdown? No ?Has the patient/family member arranged specialty MD follow up (ie cardiology/neurology/renal/surgical/etc.)?  She will call Dr Zada Finders office today to scheduled HFU appointment. She has an appointment with Dr Percival Spanish and PCP. ?Can we help arrange? NA ?Does the patient need any other services or support that we can help arrange? No ?Are caregivers following through as expected in assisting the patient? Yes ?Has the patient quit smoking, drinking alcohol, or using drugs as recommended? (                        ) ? ?Appointment date/time 08/05/2021 ? arrival time 9:00 for 9:15 appointment with Bayard Hugger ANP-C. At  ?[place] ? ?

## 2021-08-05 ENCOUNTER — Other Ambulatory Visit: Payer: Self-pay

## 2021-08-05 ENCOUNTER — Encounter: Payer: Self-pay | Admitting: Registered Nurse

## 2021-08-05 ENCOUNTER — Encounter: Payer: Medicare Other | Attending: Registered Nurse | Admitting: Registered Nurse

## 2021-08-05 VITALS — BP 153/68 | HR 73 | Ht 68.0 in

## 2021-08-05 DIAGNOSIS — I10 Essential (primary) hypertension: Secondary | ICD-10-CM | POA: Diagnosis not present

## 2021-08-05 DIAGNOSIS — S065XAA Traumatic subdural hemorrhage with loss of consciousness status unknown, initial encounter: Secondary | ICD-10-CM | POA: Insufficient documentation

## 2021-08-05 NOTE — Progress Notes (Signed)
? ?Subjective:  ? ? Patient ID: Denise Page, female    DOB: Oct 01, 1940, 81 y.o.   MRN: [id number] ? ?HPI: Denise Page is a 81 y.o. female who is here for [place] appointment for F/U of her SDH and Essential Hypertension. Marland Kitchen  ?She was admitted to [facility] for recent falls and memory changes, S/P Craniotomy x 2.  ? ?Dr Harley Alto H&P on 07/17/2021 ?HPI:  Denise Page is an 81 year old female with history of  CAS, anemia, gastric ulcer, TVAR 11/22,  HTN, delirium  and recent admission to [facility] for SDH 2/2  after fall, recurrent fall with acute on chronic SDH requiring readmission 02/08 and underwent crani X 2 but continued to have progressive decline and was transitioned to comfort care due to poor recovery/obtundation. She was d/c to Hospice on 07/08/21 but after a few days in hospice, she was noted to have awakening and started interacting with family therefore family wanted to resume full care with tube feed for nutritional support. 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  ?  ?Dr. Venetia Constable consulted for input and felt that  improvement in mentation likely due to [place] which as d/c at discharge as CT head showed no change in SDH.   He recommended endovascular MMA embolization to stop further hemorrhage if patient continued to improve after Keppra metabolites washed out but felt to have poor overall long term prognosis. Mentation has been improving with increase in verbal output and she was started on D3, thins by 02/20 due to marked oral delay and mild intermittent right pocketing.  She has had issues with confusion and agitation--has pulled out her foley as well as anxiety at nights affecting sleep. MMA embolization deferred due to anesthesia risks and concerns of not being liberated from vent.  ? ?Denise Page was  admitted to inpatient rehabilitation on 07/17/2021 and discharged home on 07/25/2021. She was supposes to go to outpatient therapy at [facility], Denise Page refused to go. Her daughter will call [facility] outpatient therapy and encouraged her mother to attends if Denise Page agrees, she verbalizes understanding.  ?She refuses pain. She rates her pain 0. Also reports she has a good appetite.  ? ?Arrived in wheelchair.  ?Daughter in room, all questions answered.  ? ?Pain Inventory ?Average Pain 0 ?Pain Right Now 0 ?My pain is  No pain ? ?LOCATION OF PAIN  No pain ? ?BOWEL ?Number of stools per week: 3-4 ? ? ?BLADDER ?Normal ? ? ? ?Mobility ?use a walker ?use a wheelchair ? ?Function ?retired ? ?Neuro/Psych ?trouble walking ? ?Prior Studies ?Any changes since last visit?  no ? ?Physicians involved in your care ?Any changes since last visit?  no ? ? ?Family History  ?Problem Relation Age of Onset  ? Throat cancer Sister   ? Prostate cancer Brother   ? ?Social History  ? ?Socioeconomic History  ? Marital status: Single  ?  Spouse name: Not on file  ? Number of children: Not on file  ? Years of education: Not on file  ? Highest education level: Not on file  ?Occupational History  ? Not on file  ?Tobacco Use  ? Smoking status: Never  ? Smokeless tobacco: Never  ?Vaping Use  ? Vaping Use: Never used  ?Substance and Sexual  Activity  ? Alcohol use: Not on file  ? Drug use: Not on file  ? Sexual activity: Not on file  ?Other Topics Concern  ? Not on file  ?Social History Narrative  ? Not on file  ? ?Social Determinants of Health  ? ?Financial Resource Strain: Not on file  ?Food Insecurity: Not on file  ?Transportation Needs: Not on file  ?Physical Activity: Not on file  ?Stress: Not on file  ?Social Connections: Not on file  ? ?Past Surgical History:  ?Procedure Laterality Date  ? BIOPSY  08/01/2020  ? Procedure: BIOPSY;  Surgeon: Ronnette Juniper, MD;  Location: [facility];  Service: Gastroenterology;;  EGD and COLON  ?  CHOLECYSTECTOMY    ? COLONOSCOPY N/A 08/01/2020  ? Procedure: COLONOSCOPY;  Surgeon: Ronnette Juniper, MD;  Location: [facility];  Service: Gastroenterology;  Laterality: N/A;  ? CYSTECTOMY    ? ESOPHAGOGASTRODUODENOSCOPY (EGD) WITH PROPOFOL N/A 08/01/2020  ? Procedure: ESOPHAGOGASTRODUODENOSCOPY (EGD) WITH PROPOFOL;  Surgeon: Ronnette Juniper, MD;  Location: [facility] END[facility];  Service: Gastroenterology;  Laterality: N/A;  ? POLYPECTOMY  08/01/2020  ? Procedure: POLYPECTOMY;  Surgeon: Ronnette Juniper, MD;  Location: [facility];  Service: Gastroenterology;;  ? TEE WITHOUT CARDIOVERSION N/A 11/13/2020  ? Procedure: TRANSESOPHAGEAL ECHOCARDIOGRAM (TEE);  Surgeon: Josue Hector, MD;  Location: [facility] ENDOSCOPY;  Service: Cardiovascular;  Laterality: N/A;  ? TONSILLECTOMY    ? ?Past Medical History:  ?Diagnosis Date  ? Anemia   ? Aortic stenosis 03/2021  ? s/p TVAR  ? Bilateral carotid artery disease ([place])   ? Enlarged thyroid   ? Gastric ulcer   ? Hyperlipidemia   ? Hypertension   ? Mitral stenosis   ? NSTEMI (non-ST elevated myocardial infarction) ([place])   ? ?BP (!) 163/70   Pulse 78   Ht '5\' 8"'$  (1.727 m)   SpO2 97%   BMI 24.44 kg/m?  ? ?Opioid Risk Score:   ?Fall Risk Score:  `1 ? ?Depression screen PHQ 2/9 ? ?Depression screen [facility] 2/9 08/05/2021  ?Decreased Interest 0  ?Down, Depressed, Hopeless 0  ?PHQ - 2 Score 0  ?Altered sleeping 0  ?Tired, decreased energy 0  ?Change in appetite 0  ?Feeling bad or failure about yourself  0  ?Trouble concentrating 0  ?Moving slowly or fidgety/restless 0  ?Suicidal thoughts 0  ?PHQ-9 Score 0  ?  ? ?Review of Systems  ?Constitutional: Negative.   ?HENT: Negative.    ?Eyes: Negative.   ?Respiratory: Negative.    ?Cardiovascular: Negative.   ?Gastrointestinal: Negative.   ?Endocrine: Negative.   ?Genitourinary: Negative.   ?Musculoskeletal:  Positive for gait problem.  ?Skin: Negative.   ?Allergic/Immunologic: Negative.   ?Hematological: Negative.   ?Psychiatric/Behavioral: Negative.    ? ?    ?Objective:  ? Physical Exam ?Vitals and nursing note reviewed.  ?Constitutional:   ?   Appearance: Normal appearance.  ?Cardiovascular:  ?   Rate and Rhythm: Normal rate and regular rhythm.  ?   Pulses: Normal pulses.  ?   Heart sounds: Normal heart sounds.  ?Pulmonary:  ?   Effort: Pulmonary effort is normal.  ?   Breath sounds: Normal breath sounds.  ?Musculoskeletal:  ?   Cervical back: Normal range of motion and neck supple.  ?   Comments: Normal Muscle Bulk and Muscle Testing Reveals:  ?Upper Extremities: Full ROM and Muscle Strength 5/5 ?Lower Extremities : Full ROM and Muscle Strength 5/5 ?Arrived in wheelchair   ?   ?Skin: ?  General: Skin is warm and dry.  ?Neurological:  ?   Mental Status: She is alert and oriented to person, place, and time.  ?Psychiatric:     ?   Mood and Affect: Mood normal.     ?   Behavior: Behavior normal.  ? ? ? ? ? ?   ?Assessment & Plan:  ?SDH : Neurosurgery Following. Denise Page daughter will encouraged her to attend outpatient therapy. Continue to Monitor.  ?2.  Essential Hypertension.: Continue current medication regimen. PCP following.  ? ?F/U with Dr Naaman Plummer in 4-6 weeks  .  ? ?

## 2021-09-07 NOTE — Progress Notes (Signed)
?  ?Cardiology Office Note ? ? ?Date:  09/09/2021  ? ?ID:  Denise Page, DOB 1940-08-29, MRN [id number] ? ?PCP:  Rosalee Kaufman, PA-C  ?Cardiologist:   Minus Breeding, MD ?Referring:  Rosalee Kaufman, PA-C ? ?Chief Complaint  ?Patient presents with  ? Valvular Heart Disease  ? ? ?  ?History of Present Illness: ?Denise Page is a 81 y.o. female who is referred by Rosalee Kaufman, PA-C for evaluation of aortic stenosis.  Echo in Dec last 2021 at [facility] demonstrated left atrial dilatation.  EF was 55%.  There was moderate to severe aortic stenosis with mean gradient of 27 mm Hg.  She had chest pain and had a negative perfusion study in 2021.  I sent her for a TEE.  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  There was moderate TR.  She had moderate to severe AS.  At the last visit she was going to consider a surgical consultation for possible high risk valve surgery.  I do see that she was in the hospital since then with a subdural hematoma.  She had a craniotomy. ? 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  She did actually well with this besides having hospital delirium.  It was opted to manage the mitral valve medically.  With all of her acute illness she has had hypotension and lisinopril was discontinued.  She has slowly been started back on carvedilol.  She lives with her son.  Now she is getting around with a rolling walker.  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  Her daughter does not report any syncope or presyncope.  She is not having any palpitations.  She is not having any shortness of breath.  She has no PND or orthopnea.  She is off of aspirin at the direction of the neurosurgeons as she still has some intracranial bleed and they have opted not to have a third procedure.  Her daughter says she is not doing very much but she is not in any distress. ? ? ?Past Medical History:  ?Diagnosis  Date  ? Anemia   ? Aortic stenosis 03/2021  ? s/p TVAR  ? Bilateral carotid artery disease ([place])   ? Enlarged thyroid   ? Gastric ulcer   ? Hyperlipidemia   ? Hypertension   ? Mitral stenosis   ? NSTEMI (non-ST elevated myocardial infarction) ([place])   ? ? ?Past Surgical History:  ?Procedure Laterality Date  ? BIOPSY  08/01/2020  ? Procedure: BIOPSY;  Surgeon: Ronnette Juniper, MD;  Location: [facility];  Service: Gastroenterology;;  EGD and COLON  ? CHOLECYSTECTOMY    ? COLONOSCOPY N/A 08/01/2020  ? Procedure: COLONOSCOPY;  Surgeon: Ronnette Juniper, MD;  Location: [facility];  Service: Gastroenterology;  Laterality: N/A;  ? CYSTECTOMY    ? ESOPHAGOGASTRODUODENOSCOPY (EGD) WITH PROPOFOL N/A 08/01/2020  ? Procedure: ESOPHAGOGASTRODUODENOSCOPY (EGD) WITH PROPOFOL;  Surgeon: Ronnette Juniper, MD;  Location: [facility] ENDOSCOPY;  Service: Gastroenterology;  Laterality: N/A;  ? POLYPECTOMY  08/01/2020  ? Procedure: POLYPECTOMY;  Surgeon: Ronnette Juniper, MD;  Location: [facility];  Service: Gastroenterology;;  ? TEE WITHOUT CARDIOVERSION N/A 11/13/2020  ? Procedure: TRANSESOPHAGEAL ECHOCARDIOGRAM (TEE);  Surgeon: Josue Hector, MD;  Location: [facility] ENDOSCOPY;  Service: Cardiovascular;  Laterality: N/A;  ? TONSILLECTOMY    ? ? ? ?Current Outpatient Medications  ?Medication Sig Dispense Refill  ? ALPRAZolam (XANAX) 0.5 MG  tablet Take 0.5 mg by mouth 2 (two) times daily.    ? busPIRone (BUSPAR) 5 MG tablet Take 5 mg by mouth 2 (two) times daily.    ? escitalopram (LEXAPRO) 5 MG tablet Take 1 tablet (5 mg total) by mouth at bedtime. 30 tablet 0  ? ferrous sulfate 325 (65 FE) MG tablet Take 1 tablet (325 mg total) by mouth 2 (two) times daily with a meal. (Patient taking differently: Take 325 mg by mouth daily.) 30 tablet 3  ? folic acid (FOLVITE) 1 MG tablet Take 1 tablet (1 mg total) by mouth in the morning. 30 tablet 0  ? levothyroxine (SYNTHROID) 50 MCG tablet Take 50 mcg by mouth every morning.    ? Multiple Vitamins-Minerals (CENTRUM ADULTS) TABS  Take by mouth daily.    ? pantoprazole (PROTONIX) 40 MG tablet Take 1 tablet (40 mg total) by mouth daily. 30 tablet 0  ? rosuvastatin (CRESTOR) 5 MG tablet Take 5 mg by mouth daily.    ? vitamin B-12 (CYANOCOBALAMIN) 1000 MCG tablet Take 1,000 mcg by mouth daily.    ? acetaminophen (TYLENOL) 650 MG CR tablet Take 650 mg by mouth every 8 (eight) hours as needed for pain. (Patient not taking: Reported on 09/09/2021)    ? capsaicin (ZOSTRIX) 0.025 % cream Apply topically 2 (two) times daily. (Patient not taking: Reported on 09/09/2021) 60 g 0  ? carvedilol (COREG) 6.25 MG tablet Take 1 and 1/2 tablets at bedtime daily 45 tablet 6  ? ?No current facility-administered medications for this visit.  ? ? ?Allergies:   Morphine and related, Sulfa antibiotics, Codeine, Keppra [levetiracetam], and Tramadol  ? ?ROS:  Please see the history of present illness.   Otherwise, review of systems are positive for none.   All other systems are reviewed and negative.  ? ? ?PHYSICAL EXAM: ?VS:  BP (!) 150/70   Pulse 76   Ht '5\' 8"'$  (1.727 m)   Wt 171 lb (77.6 kg)   BMI 26.00 kg/m?  , BMI Body mass index is 26 kg/m?. ?GENERAL: Slightly frail appearing ?NECK:  No jugular venous distention, waveform within normal limits, carotid upstroke brisk and symmetric, no bruits, no thyromegaly ?LUNGS:  Clear to auscultation bilaterally ?CHEST:  Unremarkable ?HEART:  PMI not displaced or sustained,S1 and S2 within normal limits, no S3, no S4, no clicks, no rubs, 2 out of 6 apical systolic murmur radiating slightly at the aortic outflow tract and very brief, 3 out of 6 apical systolic murmur radiating slightly to the axilla, no diastolic murmurs ?ABD:  Flat, positive bowel sounds normal in frequency in pitch, no bruits, no rebound, no guarding, no midline pulsatile mass, no hepatomegaly, no splenomegaly ?EXT:  2 plus pulses throughout, no edema, no cyanosis no clubbing ? ? ?EKG:  EKG is not ordered today. ?NA ? ? ?Recent Labs: ?11/22/2020: Magnesium  2.1 ?07/11/2021: TSH 8.400 ?07/18/2021: ALT 12 ?07/20/2021: BUN 15; Creatinine, Ser 1.02; Potassium 4.1; Sodium 138 ?07/25/2021: Hemoglobin 7.6; Platelets 165  ? ? ?Lipid Panel ?   ?Component Value Date/Time  ? CHOL 130 11/21/2020 0525  ? TRIG 50 11/21/2020 0525  ? HDL 49 11/21/2020 0525  ? CHOLHDL 2.7 11/21/2020 0525  ? VLDL 10 11/21/2020 0525  ? [place] 71 11/21/2020 0525  ? ?  ? ?Wt Readings from Last 3 Encounters:  ?09/09/21 171 lb (77.6 kg)  ?07/17/21 160 lb 11.5 oz (72.9 kg)  ?07/17/21 164 lb 7.4 oz (74.6 kg)  ?  ? ? ?Other  studies Reviewed: ?Additional studies/ records that were reviewed today include: Hospital records.   Review of [facility] records, [facility] of [facility] ?Review of the above records demonstrates:  Please see elsewhere in the note.   ? ? ?ASSESSMENT AND PLAN: ? ?AORTIC STENOSIS:   She is now status post TAVR.  She is going to have a follow-up echocardiogram and see Dr. Mina Marble at [facility] in January.  She cannot take aspirin.  Otherwise no change in therapy.  ? ?PULMONARY HTN:    She has no symptoms related to this and this will be followed clinically. ? ?MR:   Hopefully this is improved after TAVR.  She really would not be a MitraClip candidate likely and they would not want any aggressive therapies. ? ?ANEMIA:    She has a baseline anemia but this seems to be unchanged.   ? 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  I talked with her daughter about this and they really would not want follow-up as a likely would not want even a consideration of stenting but we will talk about this again in the future.  ? ?HTN: She was hypotensive with everything going on but now her blood pressure has been creeping up and I am going to add a low-dose 3.125 mg carvedilol in the evening that she is only taking 6.25 once daily ? ? ?Current medicines are reviewed at length with the patient today.  The patient does not have concerns regarding medicines.   ? ?The following changes have been made: As  above ? ?Labs/ tests ordered today include: None ? ?No orders of the defined types were placed in this encounter. ? ? ? ?Disposition:   FU with me in September ? ? ?Signed, ?Minus Breeding, MD  ?09/09/2021 12:58 PM

## 2021-09-09 ENCOUNTER — Ambulatory Visit (INDEPENDENT_AMBULATORY_CARE_PROVIDER_SITE_OTHER): Payer: Medicare Other | Admitting: Cardiology

## 2021-09-09 ENCOUNTER — Encounter: Payer: Self-pay | Admitting: Cardiology

## 2021-09-09 VITALS — BP 150/70 | HR 76 | Ht 68.0 in | Wt 171.0 lb

## 2021-09-09 DIAGNOSIS — I340 Nonrheumatic mitral (valve) insufficiency: Secondary | ICD-10-CM

## 2021-09-09 DIAGNOSIS — I10 Essential (primary) hypertension: Secondary | ICD-10-CM

## 2021-09-09 DIAGNOSIS — Z952 Presence of prosthetic heart valve: Secondary | ICD-10-CM | POA: Diagnosis not present

## 2021-09-09 MED ORDER — CARVEDILOL 6.25 MG PO TABS: ORAL_TABLET | ORAL | 6 refills | Status: DC

## 2021-09-09 NOTE — Patient Instructions (Signed)
Medication Instructions:  ?Take Carvedilol 6.25 mg - 1 and 1/2 tablets at bedtime. ?Continue all other medications as listed. ? ?*If you need a refill on your cardiac medications before your next appointment, please call your pharmacy* ? ?Follow-Up: ?At [facility], you and your health needs are our priority.  As part of our continuing mission to provide you with exceptional heart care, we have created designated Provider Care Teams.  These Care Teams include your primary Cardiologist (physician) and Advanced Practice Providers (APPs -  Physician Assistants and Nurse Practitioners) who all work together to provide you with the care you need, when you need it. ? ?We recommend signing up for the patient portal called "MyChart".  Sign up information is provided on this After Visit Summary.  MyChart is used to connect with patients for Virtual Visits (Telemedicine).  Patients are able to view lab/test results, encounter notes, upcoming appointments, etc.  Non-urgent messages can be sent to your provider as well.   ?To learn more about what you can do with MyChart, go to [email or web address].   ? ?Your next appointment:   ?5 month(s) ? ?The format for your next appointment:   ?In Person ? ?Provider:   ?Minus Breeding, MD  ? ? ? ?Important Information About Sugar ? ? ? ? ?  ?

## 2021-11-04 ENCOUNTER — Other Ambulatory Visit: Payer: Self-pay | Admitting: Physical Medicine and Rehabilitation

## 2021-11-18 ENCOUNTER — Encounter: Payer: Medicare Other | Attending: Physical Medicine & Rehabilitation | Admitting: Physical Medicine & Rehabilitation

## 2021-11-26 ENCOUNTER — Other Ambulatory Visit: Payer: Self-pay | Admitting: Physical Medicine and Rehabilitation

## 2021-12-02 ENCOUNTER — Other Ambulatory Visit: Payer: Self-pay | Admitting: Cardiology

## 2021-12-08 ENCOUNTER — Other Ambulatory Visit: Payer: Self-pay | Admitting: Physical Medicine & Rehabilitation

## 2021-12-25 ENCOUNTER — Other Ambulatory Visit: Payer: Self-pay | Admitting: *Deleted

## 2021-12-25 ENCOUNTER — Encounter: Payer: Self-pay | Admitting: *Deleted

## 2021-12-25 NOTE — Patient Outreach (Signed)
  Care Coordination   Initial Visit Note   12/25/2021 Name: Denise Page MRN: [id number] DOB: 1941-03-19  Denise Page is a 81 y.o. year old female who sees [place], Aundra Millet, [place] for primary care. I spoke with  Denise Page by phone today  What matters to the patients health and wellness today?  Per daughter, overall maintenance of member's health.  She often refuses treatment, but has remained stable for the last several months.  Refuses to use walker, refused PT after hospitalization in February/March, but is moving well now with walker and cane.      Goals Addressed               This Visit's Progress     Schedule AWV (pt-stated)        Care Coordination Interventions: Advised patient to Inquire about and schedule AWV with PCP Provided education to patient re: importance of AWV Reviewed scheduled/upcoming provider appointments including PCP 8/18         SDOH assessments and interventions completed:  Yes     Care Coordination Interventions Activated:  Yes  Care Coordination Interventions:  Yes, provided   Follow up plan: No further intervention required.   Encounter Outcome:  Pt. Visit Completed   Valente David, RN, MSN, [facility] Care Coordinator [phone]

## 2022-02-01 DIAGNOSIS — I340 Nonrheumatic mitral (valve) insufficiency: Secondary | ICD-10-CM | POA: Insufficient documentation

## 2022-02-01 DIAGNOSIS — I6521 Occlusion and stenosis of right carotid artery: Secondary | ICD-10-CM | POA: Insufficient documentation

## 2022-02-01 NOTE — Progress Notes (Unsigned)
Cardiology Office Note   Date:  02/03/2022   ID:  Denise Page, DOB 03/02/1941, MRN [id number]  PCP:  Rosalee Kaufman, PA-C  Cardiologist:   Minus Breeding, MD Referring:  Rosalee Kaufman, PA-C  Chief Complaint  Patient presents with   Dizziness      History of Present Illness: Denise Page is a 81 y.o. female who is referred by Rosalee Kaufman, PA-C for evaluation of aortic stenosis.  Echo in Dec last 2021 at [facility] demonstrated left atrial dilatation.  EF was 55%.  There was moderate to severe aortic stenosis with mean gradient of 27 mm Hg.  She had chest pain and had a negative perfusion study in 2021.  I sent her for a TEE.  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  There was moderate TR.  She had moderate to severe AS.  She has a history of subdural hematoma with craniotomy.   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  It was decided to manage the mitral valve conservatively.  At the last visit I titrated an additional low dose of Coreg.  She gets dizzy.  She is a fall in her yard.  She is not really describing orthostatic symptoms.  She has lots of other somatic complaints including occasional pain in her right hand not by her primary provider to be neurologic.  She is not having any overt shortness of breath, PND or orthopnea.  She is not having any chest pressure, neck or arm discomfort other than as mentioned.    Past Medical History:  Diagnosis Date   Anemia    Aortic stenosis 03/2021   s/p TVAR   Bilateral carotid artery disease ([facility])    Enlarged thyroid    Gastric ulcer    Hyperlipidemia    Hypertension    Mitral stenosis    NSTEMI (non-ST elevated myocardial infarction) [facility])     Past Surgical History:  Procedure Laterality Date   BIOPSY  08/01/2020   Procedure: BIOPSY;  Surgeon: Ronnette Juniper, MD;  Location: [facility] ENDOSCOPY;  Service: Gastroenterology;;  EGD and COLON   CHOLECYSTECTOMY      COLONOSCOPY N/A 08/01/2020   Procedure: COLONOSCOPY;  Surgeon: Ronnette Juniper, MD;  Location: [facility] ENDOSCOPY;  Service: Gastroenterology;  Laterality: N/A;   CYSTECTOMY     ESOPHAGOGASTRODUODENOSCOPY (EGD) WITH PROPOFOL N/A 08/01/2020   Procedure: ESOPHAGOGASTRODUODENOSCOPY (EGD) WITH PROPOFOL;  Surgeon: Ronnette Juniper, MD;  Location: [facility] ENDOSCOPY;  Service: Gastroenterology;  Laterality: N/A;   POLYPECTOMY  08/01/2020   Procedure: POLYPECTOMY;  Surgeon: Ronnette Juniper, MD;  Location: [facility] ENDOSCOPY;  Service: Gastroenterology;;   TEE WITHOUT CARDIOVERSION N/A 11/13/2020   Procedure: TRANSESOPHAGEAL ECHOCARDIOGRAM (TEE);  Surgeon: Josue Hector, MD;  Location: [facility] ENDOSCOPY;  Service: Cardiovascular;  Laterality: N/A;   TONSILLECTOMY       Current Outpatient Medications  Medication Sig Dispense Refill   ALPRAZolam (XANAX) 0.5 MG tablet Take 0.5 mg by mouth 2 (two) times daily.     busPIRone (BUSPAR) 5 MG tablet TAKE 1 TABLET BY MOUTH TWICE DAILY 60 tablet 0   carvedilol (COREG) 6.25 MG tablet Take 1 and 1/2 tablets at bedtime daily 45 tablet 6   escitalopram (LEXAPRO) 5 MG tablet TAKE 1 TABLET BY MOUTH AT BEDTIME 30 tablet 4   ferrous sulfate 325 (65 FE) MG tablet Take 1 tablet (325 mg total) by mouth 2 (two) times daily with a meal. (Patient taking  differently: Take 325 mg by mouth daily.) 30 tablet 3   folic acid (FOLVITE) 1 MG tablet Take 1 tablet (1 mg total) by mouth in the morning. 30 tablet 0   levothyroxine (SYNTHROID) 75 MCG tablet Take 75 mcg by mouth every morning.     Multiple Vitamins-Minerals (CENTRUM ADULTS) TABS Take by mouth daily.     pantoprazole (PROTONIX) 40 MG tablet Take 1 tablet (40 mg total) by mouth daily. 30 tablet 0   rosuvastatin (CRESTOR) 5 MG tablet TAKE 1 TABLET BY MOUTH EVERY DAY 90 tablet 1   vitamin B-12 (CYANOCOBALAMIN) 1000 MCG tablet Take 1,000 mcg by mouth daily.     acetaminophen (TYLENOL) 650 MG CR tablet Take 650 mg by mouth every 8 (eight) hours as needed for  pain. (Patient not taking: Reported on 09/09/2021)     No current facility-administered medications for this visit.    Allergies:   Morphine and related, Sulfa antibiotics, Codeine, Keppra [levetiracetam], and Tramadol   ROS:  Please see the history of present illness.   Otherwise, review of systems are positive for none.   All other systems are reviewed and negative.    PHYSICAL EXAM: VS:  BP 138/70   Pulse 64   Ht '5\' 8"'$  (1.727 m)   Wt 170 lb (77.1 kg)   BMI 25.85 kg/m  , BMI Body mass index is 25.85 kg/m. GENERAL: Somewhat frail appearing for her age NECK:  No jugular venous distention, waveform within normal limits, carotid upstroke brisk and symmetric, no bruits, no thyromegaly LUNGS:  Clear to auscultation bilaterally CHEST:  Unremarkable HEART:  PMI not displaced or sustained,S1 and S2 within normal limits, no S3, no S4, no clicks, no rubs, 3 out of 6 holosystolic murmur at the apex radiating anteriorly, no diastolic murmurs ABD:  Flat, positive bowel sounds normal in frequency in pitch, no bruits, no rebound, no guarding, no midline pulsatile mass, no hepatomegaly, no splenomegaly EXT:  2 plus pulses throughout, no edema, no cyanosis no clubbing   EKG:  EKG is not ordered today.    Recent Labs: 07/11/2021: TSH 8.400 07/18/2021: ALT 12 07/20/2021: BUN 15; Creatinine, Ser 1.02; Potassium 4.1; Sodium 138 07/25/2021: Hemoglobin 7.6; Platelets 165    Lipid Panel    Component Value Date/Time   CHOL 130 11/21/2020 0525   TRIG 50 11/21/2020 0525   HDL 49 11/21/2020 0525   CHOLHDL 2.7 11/21/2020 0525   VLDL 10 11/21/2020 0525   LDLCALC 71 11/21/2020 0525      Wt Readings from Last 3 Encounters:  02/03/22 170 lb (77.1 kg)  09/09/21 171 lb (77.6 kg)  07/17/21 160 lb 11.5 oz (72.9 kg)      Other studies Reviewed: Additional studies/ records that were reviewed today include: None Review of the above records demonstrates:  Please see elsewhere in the note.      ASSESSMENT AND PLAN:  AORTIC STENOSIS:   She is now status post TAVR.  She is going to have a follow-up echocardiogram and see Dr. Mina Marble at [facility] in January.   PULMONARY HTN:   We are going to manage this conservatively.  She has no distress related to this.  She has mild shortness of breath.  No change in therapy.  MR:   She is due to have repeat imaging at [facility].  She was not felt to be a MitraClip candidate and will manage conservatively which is an accordance with what her family wishes.   ANEMIA:   She was  significantly anemic with a hemoglobin of 7.3 seven months ago.  More recently this 1 is up to 10.3.  I will defer to Rosalee Kaufman, PA-C    she has a baseline anemia but this seems to be unchanged.    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  Her family does not want further imaging as he would not want to consider stenting or endarterectomy.   HTN:    I tweaked her carvedilol previously and I think that is all she can handle.  She does not tolerate med titration.    Current medicines are reviewed at length with the patient today.  The patient does not have concerns regarding medicines.    The following changes have been made: As above  Labs/ tests ordered today include: None  No orders of the defined types were placed in this encounter.    Disposition:   FU with me in 6 months   Signed, Minus Breeding, MD  02/03/2022 12:16 PM    [facility]

## 2022-02-03 ENCOUNTER — Encounter: Payer: Self-pay | Admitting: Cardiology

## 2022-02-03 ENCOUNTER — Ambulatory Visit (INDEPENDENT_AMBULATORY_CARE_PROVIDER_SITE_OTHER): Payer: Medicare Other | Admitting: Cardiology

## 2022-02-03 VITALS — BP 138/70 | HR 64 | Ht 68.0 in | Wt 170.0 lb

## 2022-02-03 DIAGNOSIS — I350 Nonrheumatic aortic (valve) stenosis: Secondary | ICD-10-CM | POA: Diagnosis not present

## 2022-02-03 DIAGNOSIS — I340 Nonrheumatic mitral (valve) insufficiency: Secondary | ICD-10-CM

## 2022-02-03 DIAGNOSIS — I6521 Occlusion and stenosis of right carotid artery: Secondary | ICD-10-CM | POA: Diagnosis not present

## 2022-02-03 DIAGNOSIS — I272 Pulmonary hypertension, unspecified: Secondary | ICD-10-CM | POA: Diagnosis not present

## 2022-02-03 NOTE — Patient Instructions (Signed)
Medication Instructions:  The current medical regimen is effective;  continue present plan and medications.  *If you need a refill on your cardiac medications before your next appointment, please call your pharmacy*  Follow-Up: At [facility], you and your health needs are our priority.  As part of our continuing mission to provide you with exceptional heart care, we have created designated Provider Care Teams.  These Care Teams include your primary Cardiologist (physician) and Advanced Practice Providers (APPs -  Physician Assistants and Nurse Practitioners) who all work together to provide you with the care you need, when you need it.  We recommend signing up for the patient portal called "MyChart".  Sign up information is provided on this After Visit Summary.  MyChart is used to connect with patients for Virtual Visits (Telemedicine).  Patients are able to view lab/test results, encounter notes, upcoming appointments, etc.  Non-urgent messages can be sent to your provider as well.   To learn more about what you can do with MyChart, go to [email or web address].    Your next appointment:   6 month(s)  The format for your next appointment:   In Person  Provider:   James Hochrein, MD     Important Information About Sugar       

## 2022-06-30 ENCOUNTER — Other Ambulatory Visit: Payer: Self-pay | Admitting: Cardiology

## 2022-07-16 ENCOUNTER — Other Ambulatory Visit: Payer: Self-pay | Admitting: Physical Medicine and Rehabilitation

## 2022-07-18 NOTE — Progress Notes (Unsigned)
Cardiology Office Note   Date:  07/21/2022   ID:  Rivkah Baye, DOB [birth year], MRN [id number]  PCP:  Rosalee Kaufman, PA-C  Cardiologist:   Minus Breeding, MD Referring:  Rosalee Kaufman, PA-C   Chief Complaint  Patient presents with   Dizziness     History of Present Illness: Denise Page is a 82 y.o. female who is referred by Rosalee Kaufman, PA-C for evaluation of aortic stenosis.  Echo in Dec last 2021 at [facility] demonstrated left atrial dilatation.  EF was 55%.  There was moderate to severe aortic stenosis with mean gradient of 27 mm Hg.  She had chest pain and had a negative perfusion study in 2021.  I sent her for a TEE.  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  There was moderate TR.  She had moderate to severe AS.  She has a history of subdural hematoma with craniotomy.   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  It was decided to manage the mitral valve conservatively.  She returns for follow up.  She was to get her valve following at [facility] but she fell and broke her ribs in the fall and decided just to forego that appointment and get her follow-up here.  The patient denies any new symptoms such as chest discomfort, neck or arm discomfort. There has been no new shortness of breath, PND or orthopnea. There have been no reported palpitations, presyncope or syncope.    Past Medical History:  Diagnosis Date   Anemia    Aortic stenosis 03/2021   s/p TVAR   Bilateral carotid artery disease (HCC)    Enlarged thyroid    Gastric ulcer    Hyperlipidemia    Hypertension    Mitral stenosis    NSTEMI (non-ST elevated myocardial infarction) [facility])     Past Surgical History:  Procedure Laterality Date   BIOPSY  08/01/2020   Procedure: BIOPSY;  Surgeon: Ronnette Juniper, MD;  Location: [facility];  Service: Gastroenterology;;  EGD and COLON   CHOLECYSTECTOMY     COLONOSCOPY N/A 08/01/2020   Procedure:  COLONOSCOPY;  Surgeon: Ronnette Juniper, MD;  Location: [facility];  Service: Gastroenterology;  Laterality: N/A;   CYSTECTOMY     ESOPHAGOGASTRODUODENOSCOPY (EGD) WITH PROPOFOL N/A 08/01/2020   Procedure: ESOPHAGOGASTRODUODENOSCOPY (EGD) WITH PROPOFOL;  Surgeon: Ronnette Juniper, MD;  Location: [facility];  Service: Gastroenterology;  Laterality: N/A;   POLYPECTOMY  08/01/2020   Procedure: POLYPECTOMY;  Surgeon: Ronnette Juniper, MD;  Location: [facility];  Service: Gastroenterology;;   TEE WITHOUT CARDIOVERSION N/A 11/13/2020   Procedure: TRANSESOPHAGEAL ECHOCARDIOGRAM (TEE);  Surgeon: Josue Hector, MD;  Location: [facility] ENDOSCOPY;  Service: Cardiovascular;  Laterality: N/A;   TONSILLECTOMY       Current Outpatient Medications  Medication Sig Dispense Refill   acetaminophen (TYLENOL) 650 MG CR tablet Take 650 mg by mouth every 8 (eight) hours as needed for pain.     ALPRAZolam (XANAX) 0.5 MG tablet Take 0.5 mg by mouth 2 (two) times daily.     carvedilol (COREG) 6.25 MG tablet Take 1 and 1/2 tablets at bedtime daily 45 tablet 6   ferrous sulfate 325 (65 FE) MG tablet Take 1 tablet (325 mg total) by mouth 2 (two) times daily with a meal. (Patient taking differently: Take 325 mg by mouth daily.) 30 tablet 3   folic acid (FOLVITE) 1 MG tablet Take 1 tablet (1 mg  total) by mouth in the morning. 30 tablet 0   levothyroxine (SYNTHROID) 75 MCG tablet Take 75 mcg by mouth every morning.     Multiple Vitamins-Minerals (CENTRUM ADULTS) TABS Take by mouth daily.     pantoprazole (PROTONIX) 40 MG tablet Take 1 tablet (40 mg total) by mouth daily. 30 tablet 0   rosuvastatin (CRESTOR) 5 MG tablet TAKE 1 TABLET BY MOUTH EVERY DAY 90 tablet 1   vitamin B-12 (CYANOCOBALAMIN) 1000 MCG tablet Take 1,000 mcg by mouth daily.     No current facility-administered medications for this visit.    Allergies:   Morphine and related, Sulfa antibiotics, Codeine, Keppra [levetiracetam], and Tramadol   ROS:  Please see the  history of present illness.   Otherwise, review of systems are positive for none.   All other systems are reviewed and negative.    PHYSICAL EXAM: VS:  BP 129/70   Pulse 66   Ht '5\' 8"'$  (1.727 m)   Wt 165 lb (74.8 kg)   BMI 25.09 kg/m  , BMI Body mass index is 25.09 kg/m. GENERAL: Somewhat frail appearing for her age NECK:  No jugular venous distention, waveform within normal limits, carotid upstroke brisk and symmetric, no bruits, no thyromegaly LUNGS:  Clear to auscultation bilaterally CHEST:  Unremarkable HEART:  PMI not displaced or sustained,S1 and S2 within normal limits, no S3, no S4, no clicks, no rubs, 3 out of 6 holosystolic murmur at the apex radiating anteriorly, positive diastolic murmur heard at the third left interspace and lasting leak into diastole  ABD:  Flat, positive bowel sounds normal in frequency in pitch, no bruits, no rebound, no guarding, no midline pulsatile mass, no hepatomegaly, no splenomegaly EXT:  2 plus pulses throughout, no edema, no cyanosis no clubbing   EKG:  EKG is  ordered today. Sinus rhythm, rate 66, axis within normal limits, intervals within normal limits, no acute ST-T wave changes.   Recent Labs: 07/25/2021: Hemoglobin 7.6; Platelets 165    Lipid Panel    Component Value Date/Time   CHOL 130 11/21/2020 0525   TRIG 50 11/21/2020 0525   HDL 49 11/21/2020 0525   CHOLHDL 2.7 11/21/2020 0525   VLDL 10 11/21/2020 0525   LDLCALC 71 11/21/2020 0525      Wt Readings from Last 3 Encounters:  07/21/22 165 lb (74.8 kg)  02/03/22 170 lb (77.1 kg)  09/09/21 171 lb (77.6 kg)      Other studies Reviewed: Additional studies/ records that were reviewed today include: None Review of the above records demonstrates:  Please see elsewhere in the note.     ASSESSMENT AND PLAN:  AORTIC STENOSIS:   She is now status post TAVR.  I will follow this up in 6 months with an echo.  She understands endocarditis prophylaxis.  PULMONARY HTN:   We are  going to manage this conservatively.    MR:   She was not felt to be a MitraClip candidate.  We will manage this conservatively per her family's wishes as well.   ANEMIA:   Her last Hgb was up to 10.9 which was improved from 7.3.   CAROTID STENOSIS: This was found to be 50 to 69% right stenosis on June 2022.  I will follow-up carotid Dopplers after this appointment.  HTN:    Her blood pressure is controlled.  Had moved slowly on her medications because of some lower blood pressures.  Current medicines are reviewed at length with the patient today.  The  patient does not have concerns regarding medicines.    The following changes have been made:   None  Labs/ tests ordered today include:   Orders Placed This Encounter  Procedures   US Carotid Bilateral   EKG 12-Lead   ECHOCARDIOGRAM COMPLETE     Disposition:   FU with me in 6 months   Signed, Minus Breeding, MD  07/21/2022 12:52 PM    [place]

## 2022-07-21 ENCOUNTER — Encounter: Payer: Self-pay | Admitting: Cardiology

## 2022-07-21 ENCOUNTER — Ambulatory Visit (INDEPENDENT_AMBULATORY_CARE_PROVIDER_SITE_OTHER): Payer: Medicare Other | Admitting: Cardiology

## 2022-07-21 VITALS — BP 129/70 | HR 66 | Ht 68.0 in | Wt 165.0 lb

## 2022-07-21 DIAGNOSIS — I10 Essential (primary) hypertension: Secondary | ICD-10-CM

## 2022-07-21 DIAGNOSIS — I340 Nonrheumatic mitral (valve) insufficiency: Secondary | ICD-10-CM

## 2022-07-21 DIAGNOSIS — R0989 Other specified symptoms and signs involving the circulatory and respiratory systems: Secondary | ICD-10-CM | POA: Diagnosis not present

## 2022-07-21 NOTE — Patient Instructions (Signed)
Medication Instructions:  The current medical regimen is effective;  continue present plan and medications.  *If you need a refill on your cardiac medications before your next appointment, please call your pharmacy*  Testing/Procedures: Your physician has requested that you have a carotid duplex. This test is an ultrasound of the carotid arteries in your neck. It looks at blood flow through these arteries that supply the brain with blood. Allow one hour for this exam. There are no restrictions or special instructions.  Your physician has requested that you have an echocardiogram in 6 months. Echocardiography is a painless test that uses sound waves to create images of your heart. It provides your doctor with information about the size and shape of your heart and how well your heart's chambers and valves are working. This procedure takes approximately one hour. There are no restrictions for this procedure. Please do NOT wear cologne, perfume, aftershave, or lotions (deodorant is allowed). Please arrive 15 minutes prior to your appointment time.  The below testing has been ordered to be completed at [facility].  You will be contacted to be scheduled.  Follow-Up: At [facility], you and your health needs are our priority.  As part of our continuing mission to provide you with exceptional heart care, we have created designated Provider Care Teams.  These Care Teams include your primary Cardiologist (physician) and Advanced Practice Providers (APPs -  Physician Assistants and Nurse Practitioners) who all work together to provide you with the care you need, when you need it.  We recommend signing up for the patient portal called "MyChart".  Sign up information is provided on this After Visit Summary.  MyChart is used to connect with patients for Virtual Visits (Telemedicine).  Patients are able to view lab/test results, encounter notes, upcoming appointments, etc.  Non-urgent messages can be  sent to your provider as well.   To learn more about what you can do with MyChart, go to [email or web address].    Your next appointment:   6 month(s)  Provider:   Minus Breeding, MD

## 2022-08-02 ENCOUNTER — Ambulatory Visit (HOSPITAL_COMMUNITY)
Admission: RE | Admit: 2022-08-02 | Discharge: 2022-08-02 | Disposition: A | Discharge status: Home / Self Care | Payer: Medicare Other | Source: Ambulatory Visit | Attending: Cardiology | Admitting: Cardiology

## 2022-08-02 DIAGNOSIS — R0989 Other specified symptoms and signs involving the circulatory and respiratory systems: Secondary | ICD-10-CM | POA: Diagnosis present

## 2022-08-03 ENCOUNTER — Telehealth: Payer: Self-pay | Admitting: *Deleted

## 2022-08-03 NOTE — Telephone Encounter (Signed)
Spoke with pt daughter, she reports the patient has had thyroid testing but it was about 3 months ago. The patient has an appointment with her medical doctor on the 20th. Will forward the results to her PCP and the daughter will discuss labs at time of appointment. The daughter also reports the patient does not always take her medications when given to her.

## 2022-08-03 NOTE — Telephone Encounter (Signed)
-----   Message from Minus Breeding, MD sent at 08/03/2022  8:25 AM EDT ----- Moderate amount of right-sided atherosclerotic plaque  similar to the 10/2020. Minimal amount of left-sided atherosclerotic plaque, similar to the 10/2020 examination Follow up in one year with repeat Doppler  Marked heterogeneity of the incidentally imaged thyroid, similar to dedicated thyroid ultrasound performed 02/2020, nonspecific Repeat TSH, T3 and T4 if not done recently by PCP. Call Ms. Harlow Mares with the results and send results to Rosalee Kaufman, PA-C

## 2022-09-04 ENCOUNTER — Other Ambulatory Visit: Payer: Self-pay | Admitting: Cardiology

## 2022-12-08 ENCOUNTER — Encounter: Payer: Self-pay | Admitting: Cardiology

## 2023-01-12 ENCOUNTER — Ambulatory Visit (HOSPITAL_COMMUNITY)
Admission: RE | Admit: 2023-01-12 | Discharge: 2023-01-12 | Disposition: A | Discharge status: Home / Self Care | Payer: Medicare Other | Source: Ambulatory Visit | Attending: Cardiology | Admitting: Cardiology

## 2023-01-12 DIAGNOSIS — I340 Nonrheumatic mitral (valve) insufficiency: Secondary | ICD-10-CM | POA: Insufficient documentation

## 2023-01-12 LAB — ECHOCARDIOGRAM COMPLETE
AR max vel: 2.25 cm2
AV Area VTI: 2.1 cm2
AV Area mean vel: 2.11 cm2
AV Mean grad: 7.3 mmHg
AV Peak grad: 14 mmHg
Ao pk vel: 1.87 m/s
Area-P 1/2: 5.97 cm2
MV M vel: 5.64 m/s
MV Peak grad: 127.2 mmHg
P 1/2 time: 412 msec
Radius: 0.6 cm
S' Lateral: 4.2 cm

## 2023-01-12 NOTE — Progress Notes (Signed)
*  PRELIMINARY RESULTS* Echocardiogram 2D Echocardiogram has been performed.  Denise Page 01/12/2023, 12:25 PM

## 2023-01-19 ENCOUNTER — Ambulatory Visit: Payer: Medicare Other | Admitting: Cardiology

## 2023-01-19 ENCOUNTER — Other Ambulatory Visit (HOSPITAL_COMMUNITY): Payer: Medicare Other

## 2023-01-20 ENCOUNTER — Other Ambulatory Visit: Payer: Self-pay | Admitting: Cardiology

## 2023-03-14 NOTE — Progress Notes (Unsigned)
Cardiology Office Note:   Date:  03/16/2023  ID:  Jehnna Yakel, DOB 05/02/1941, MRN [id number] PCP: Sheela Stack  [facility] Providers Cardiologist:  Rollene Rotunda, MD {  History of Present Illness:   Denise Page is a 82 y.o. female who is referred by Royann Shivers, PA-C for evaluation of aortic stenosis.  Echo in Dec last 2021 at [facility] demonstrated left atrial dilatation.  EF was 55%.  There was moderate to severe aortic stenosis with mean gradient of 27 mm Hg.  She had chest pain and had a negative perfusion study in 2021.  I sent her for a TEE.  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  There was moderate TR.  She had moderate to severe AS.  She has a history of subdural hematoma with craniotomy.   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  It was decided to manage the mitral valve conservatively.  She has a history of subdural hematoma with craniotomy.    This was in March 2023.  She had a fall and a quite complicated hospital rotation.  She was told never to take aspirin again.  She returns for follow up.  She is actually done relatively well.  She gets around in her home.  She does get a little orthostatic dizziness but she is careful to avoid dizziness or falling.  She has had no new shortness of breath, PND or orthopnea.  She has had no new chest pressure, neck or arm discomfort.  She had no weight gain or edema.  ROS: As stated in the HPI and negative for all other systems.  Studies Reviewed:    EKG:   Sinus rhythm, rate 66, axis within normal's, intervals within normal limits, no acute ST-T wave changes.  Risk Assessment/Calculations:              Physical Exam:   VS:  BP 138/70   Pulse 72   Ht 5\' 8"  (1.727 m)   Wt 164 lb (74.4 kg)   BMI 24.94 kg/m    Wt Readings from Last 3 Encounters:  03/16/23 164 lb (74.4 kg)  07/21/22 165 lb (74.8 kg)  02/03/22 170 lb (77.1 kg)     GEN: Well  nourished, well developed in no acute distress NECK: No JVD; No carotid bruits CARDIAC: RRR, 2 out of 6 apical systolic murmur radiating slightly at aortic outflow tract, 3 out of 6 apical systolic murmur later in systole and radiating to the axilla, no diastolic murmurs, rubs, gallops RESPIRATORY:  Clear to auscultation without rales, wheezing or rhonchi  ABDOMEN: Soft, non-tender, non-distended EXTREMITIES:  No edema; No deformity   ASSESSMENT AND PLAN:    AORTIC STENOSIS:   She is now status post TAVR.  She done well with this.  She understands endocarditis prophylaxis.  She had normal valve function in August of this year.  No change in therapy.   PULMONARY HTN:    She has no overt symptoms and we are going to manage this medically.  MR:   She was not felt to be a MitraClip candidate.  She would not want an open surgical repair and wants to manage conservatively and is doing well with this.   ANEMIA:   Her last Hgb was up to 11 which is improved compared to previous.  No change in therapy.  Of note I added aspirin as an intolerance to her meds.  CAROTID STENOSIS: This was found to be 50 to 69% right stenosis  in March 2024.  We will follow this again in 1 year.    HTN:    Her blood pressure is well-controlled.  No change in therapy.  DIZZINESS: Sounds like she has some orthostatic symptoms and he spent some time talking about this and avoidance of falls.  Her daughters can review this with her at home.       Follow up with me in one year.   Signed, Rollene Rotunda, MD

## 2023-03-16 ENCOUNTER — Encounter: Payer: Self-pay | Admitting: Cardiology

## 2023-03-16 ENCOUNTER — Ambulatory Visit (INDEPENDENT_AMBULATORY_CARE_PROVIDER_SITE_OTHER): Payer: Medicare Other | Admitting: Cardiology

## 2023-03-16 VITALS — BP 138/70 | HR 72 | Ht 68.0 in | Wt 164.0 lb

## 2023-03-16 DIAGNOSIS — I10 Essential (primary) hypertension: Secondary | ICD-10-CM | POA: Diagnosis not present

## 2023-03-16 DIAGNOSIS — I340 Nonrheumatic mitral (valve) insufficiency: Secondary | ICD-10-CM

## 2023-03-16 DIAGNOSIS — I350 Nonrheumatic aortic (valve) stenosis: Secondary | ICD-10-CM

## 2023-03-16 DIAGNOSIS — I272 Pulmonary hypertension, unspecified: Secondary | ICD-10-CM

## 2023-03-16 NOTE — Patient Instructions (Signed)

## 2023-04-13 ENCOUNTER — Other Ambulatory Visit: Payer: Self-pay | Admitting: Cardiology

## 2023-07-14 ENCOUNTER — Other Ambulatory Visit: Payer: Self-pay | Admitting: Cardiology

## 2023-08-14 ENCOUNTER — Other Ambulatory Visit: Payer: Self-pay | Admitting: Cardiology

## 2024-04-09 ENCOUNTER — Other Ambulatory Visit: Payer: Self-pay | Admitting: Cardiology

## 2024-05-07 ENCOUNTER — Other Ambulatory Visit: Payer: Self-pay | Admitting: Cardiology

## 2024-06-13 ENCOUNTER — Other Ambulatory Visit: Payer: Self-pay | Admitting: Cardiology

## 2024-06-13 NOTE — Telephone Encounter (Signed)
 In accordance with refill protocols, please review and address the following requirements before this medication refill can be authorized:  Appointment  and Labs   Valid encounter within last 12 months  Cr in normal range and within 365 days  ALT in normal range and within 365 days  AST in normal range and within 365 days  Pending appt 09/12/24 [place]- cmp  04/12/24,  Cr Abn

## 2024-06-19 ENCOUNTER — Other Ambulatory Visit: Payer: Self-pay | Admitting: Cardiology

## 2024-09-12 ENCOUNTER — Ambulatory Visit: Admitting: Cardiology
